# Patient Record
Sex: Male | Born: 1955 | Race: White | Hispanic: No | Marital: Married | Smoking: Former smoker
Health system: Southern US, Community
[De-identification: ages and names within clinical notes are randomized; demographics above are authoritative.]

## PROBLEM LIST (undated history)

## (undated) DIAGNOSIS — G90519 Complex regional pain syndrome I of unspecified upper limb: Secondary | ICD-10-CM

## (undated) DIAGNOSIS — G894 Chronic pain syndrome: Secondary | ICD-10-CM

## (undated) DIAGNOSIS — M797 Fibromyalgia: Secondary | ICD-10-CM

## (undated) DIAGNOSIS — J189 Pneumonia, unspecified organism: Secondary | ICD-10-CM

## (undated) DIAGNOSIS — I6529 Occlusion and stenosis of unspecified carotid artery: Secondary | ICD-10-CM

## (undated) DIAGNOSIS — F32A Depression, unspecified: Secondary | ICD-10-CM

## (undated) DIAGNOSIS — G9050 Complex regional pain syndrome I, unspecified: Secondary | ICD-10-CM

## (undated) DIAGNOSIS — G4701 Insomnia due to medical condition: Secondary | ICD-10-CM

## (undated) DIAGNOSIS — Z9289 Personal history of other medical treatment: Secondary | ICD-10-CM

## (undated) DIAGNOSIS — F329 Major depressive disorder, single episode, unspecified: Secondary | ICD-10-CM

## (undated) DIAGNOSIS — R0683 Snoring: Secondary | ICD-10-CM

## (undated) DIAGNOSIS — R112 Nausea with vomiting, unspecified: Secondary | ICD-10-CM

## (undated) DIAGNOSIS — Z9889 Other specified postprocedural states: Secondary | ICD-10-CM

## (undated) DIAGNOSIS — N059 Unspecified nephritic syndrome with unspecified morphologic changes: Secondary | ICD-10-CM

## (undated) DIAGNOSIS — K219 Gastro-esophageal reflux disease without esophagitis: Secondary | ICD-10-CM

## (undated) DIAGNOSIS — Z8669 Personal history of other diseases of the nervous system and sense organs: Secondary | ICD-10-CM

## (undated) DIAGNOSIS — J9819 Other pulmonary collapse: Secondary | ICD-10-CM

## (undated) DIAGNOSIS — G4730 Sleep apnea, unspecified: Secondary | ICD-10-CM

## (undated) DIAGNOSIS — C61 Malignant neoplasm of prostate: Secondary | ICD-10-CM

## (undated) DIAGNOSIS — G709 Myoneural disorder, unspecified: Secondary | ICD-10-CM

## (undated) DIAGNOSIS — F419 Anxiety disorder, unspecified: Secondary | ICD-10-CM

## (undated) DIAGNOSIS — G5690 Unspecified mononeuropathy of unspecified upper limb: Secondary | ICD-10-CM

## (undated) HISTORY — PX: VASCULAR SURGERY: SHX849

## (undated) HISTORY — PX: CYSTECTOMY: SUR359

## (undated) HISTORY — DX: Malignant neoplasm of prostate: C61

## (undated) HISTORY — PX: EYE SURGERY: SHX253

---

## 1985-01-14 HISTORY — PX: KNEE ARTHROSCOPY: SHX127

## 2001-01-14 HISTORY — PX: REFRACTIVE SURGERY: SHX103

## 2003-12-01 ENCOUNTER — Ambulatory Visit (HOSPITAL_COMMUNITY): Admission: RE | Admit: 2003-12-01 | Discharge: 2003-12-01 | Payer: Self-pay | Admitting: Pulmonary Disease

## 2006-01-14 DIAGNOSIS — Z8669 Personal history of other diseases of the nervous system and sense organs: Secondary | ICD-10-CM | POA: Insufficient documentation

## 2006-01-14 HISTORY — DX: Personal history of other diseases of the nervous system and sense organs: Z86.69

## 2006-03-11 ENCOUNTER — Ambulatory Visit: Payer: Self-pay | Admitting: Gastroenterology

## 2006-03-11 ENCOUNTER — Inpatient Hospital Stay (HOSPITAL_COMMUNITY): Admission: EM | Admit: 2006-03-11 | Discharge: 2006-03-12 | Payer: Self-pay | Admitting: Emergency Medicine

## 2006-03-21 ENCOUNTER — Ambulatory Visit (HOSPITAL_COMMUNITY): Admission: RE | Admit: 2006-03-21 | Discharge: 2006-03-21 | Payer: Self-pay | Admitting: Pulmonary Disease

## 2006-03-21 ENCOUNTER — Encounter (HOSPITAL_COMMUNITY): Admission: RE | Admit: 2006-03-21 | Discharge: 2006-04-20 | Payer: Self-pay | Admitting: Pulmonary Disease

## 2006-03-26 ENCOUNTER — Ambulatory Visit (HOSPITAL_COMMUNITY): Payer: Self-pay | Admitting: Pulmonary Disease

## 2006-04-02 ENCOUNTER — Ambulatory Visit: Payer: Self-pay | Admitting: Vascular Surgery

## 2006-04-08 ENCOUNTER — Ambulatory Visit (HOSPITAL_COMMUNITY): Admission: RE | Admit: 2006-04-08 | Discharge: 2006-04-08 | Payer: Self-pay | Admitting: Vascular Surgery

## 2006-04-14 ENCOUNTER — Ambulatory Visit (HOSPITAL_COMMUNITY): Admission: RE | Admit: 2006-04-14 | Discharge: 2006-04-15 | Payer: Self-pay | Admitting: Vascular Surgery

## 2006-04-24 ENCOUNTER — Encounter: Admission: RE | Admit: 2006-04-24 | Discharge: 2006-04-24 | Payer: Self-pay | Admitting: Interventional Radiology

## 2006-05-01 ENCOUNTER — Ambulatory Visit: Payer: Self-pay | Admitting: Vascular Surgery

## 2006-05-05 ENCOUNTER — Ambulatory Visit: Payer: Self-pay | Admitting: Vascular Surgery

## 2006-05-20 ENCOUNTER — Ambulatory Visit: Payer: Self-pay | Admitting: Vascular Surgery

## 2006-06-30 ENCOUNTER — Ambulatory Visit: Payer: Self-pay | Admitting: Vascular Surgery

## 2006-08-13 ENCOUNTER — Encounter: Admission: RE | Admit: 2006-08-13 | Discharge: 2006-08-13 | Payer: Self-pay | Admitting: Neurology

## 2006-12-04 ENCOUNTER — Ambulatory Visit: Payer: Self-pay | Admitting: Vascular Surgery

## 2006-12-29 ENCOUNTER — Ambulatory Visit: Payer: Self-pay | Admitting: Vascular Surgery

## 2007-01-15 HISTORY — PX: RIB RESECTION: SHX5077

## 2007-02-19 ENCOUNTER — Ambulatory Visit (HOSPITAL_COMMUNITY): Admission: RE | Admit: 2007-02-19 | Discharge: 2007-02-19 | Payer: Self-pay | Admitting: Pulmonary Disease

## 2007-05-06 ENCOUNTER — Ambulatory Visit: Payer: Self-pay | Admitting: Vascular Surgery

## 2007-05-13 ENCOUNTER — Inpatient Hospital Stay: Payer: Self-pay | Admitting: Vascular Surgery

## 2008-01-15 DIAGNOSIS — J189 Pneumonia, unspecified organism: Secondary | ICD-10-CM

## 2008-01-15 DIAGNOSIS — Z9289 Personal history of other medical treatment: Secondary | ICD-10-CM

## 2008-01-15 DIAGNOSIS — J9819 Other pulmonary collapse: Secondary | ICD-10-CM

## 2008-01-15 HISTORY — DX: Personal history of other medical treatment: Z92.89

## 2008-01-15 HISTORY — DX: Pneumonia, unspecified organism: J18.9

## 2008-01-15 HISTORY — DX: Other pulmonary collapse: J98.19

## 2008-05-25 ENCOUNTER — Ambulatory Visit: Payer: Self-pay | Admitting: Thoracic Surgery

## 2008-06-08 ENCOUNTER — Ambulatory Visit: Payer: Self-pay | Admitting: Thoracic Surgery

## 2008-06-09 ENCOUNTER — Inpatient Hospital Stay (HOSPITAL_COMMUNITY): Admission: AD | Admit: 2008-06-09 | Discharge: 2008-06-15 | Payer: Self-pay | Admitting: Thoracic Surgery

## 2008-06-09 ENCOUNTER — Ambulatory Visit: Payer: Self-pay | Admitting: Thoracic Surgery

## 2008-06-10 ENCOUNTER — Encounter: Payer: Self-pay | Admitting: Thoracic Surgery

## 2008-06-12 ENCOUNTER — Ambulatory Visit: Payer: Self-pay | Admitting: Infectious Disease

## 2008-06-14 ENCOUNTER — Encounter: Payer: Self-pay | Admitting: Infectious Disease

## 2008-06-14 ENCOUNTER — Encounter: Payer: Self-pay | Admitting: Thoracic Surgery

## 2008-06-22 ENCOUNTER — Ambulatory Visit: Payer: Self-pay | Admitting: Thoracic Surgery

## 2008-06-22 ENCOUNTER — Encounter: Admission: RE | Admit: 2008-06-22 | Discharge: 2008-06-22 | Payer: Self-pay | Admitting: Thoracic Surgery

## 2008-06-28 ENCOUNTER — Ambulatory Visit: Payer: Self-pay | Admitting: Thoracic Surgery

## 2008-06-28 ENCOUNTER — Encounter: Admission: RE | Admit: 2008-06-28 | Discharge: 2008-06-28 | Payer: Self-pay | Admitting: Thoracic Surgery

## 2008-07-06 ENCOUNTER — Ambulatory Visit: Payer: Self-pay | Admitting: Thoracic Surgery

## 2008-07-06 ENCOUNTER — Encounter: Admission: RE | Admit: 2008-07-06 | Discharge: 2008-07-06 | Payer: Self-pay | Admitting: Thoracic Surgery

## 2008-07-13 ENCOUNTER — Ambulatory Visit: Payer: Self-pay | Admitting: Thoracic Surgery

## 2008-07-20 ENCOUNTER — Encounter: Admission: RE | Admit: 2008-07-20 | Discharge: 2008-07-20 | Payer: Self-pay | Admitting: Thoracic Surgery

## 2008-07-20 ENCOUNTER — Ambulatory Visit: Payer: Self-pay | Admitting: Thoracic Surgery

## 2008-08-17 ENCOUNTER — Encounter: Admission: RE | Admit: 2008-08-17 | Discharge: 2008-08-17 | Payer: Self-pay | Admitting: Thoracic Surgery

## 2008-08-17 ENCOUNTER — Ambulatory Visit: Payer: Self-pay | Admitting: Thoracic Surgery

## 2008-08-23 ENCOUNTER — Ambulatory Visit: Payer: Self-pay | Admitting: Thoracic Surgery

## 2008-09-22 ENCOUNTER — Ambulatory Visit: Payer: Self-pay | Admitting: Vascular Surgery

## 2008-11-16 ENCOUNTER — Encounter: Admission: RE | Admit: 2008-11-16 | Discharge: 2008-11-16 | Payer: Self-pay | Admitting: Thoracic Surgery

## 2008-11-16 ENCOUNTER — Ambulatory Visit: Payer: Self-pay | Admitting: Thoracic Surgery

## 2009-02-16 ENCOUNTER — Ambulatory Visit: Payer: Self-pay | Admitting: Vascular Surgery

## 2009-02-22 ENCOUNTER — Ambulatory Visit: Payer: Self-pay | Admitting: Thoracic Surgery

## 2010-02-03 ENCOUNTER — Encounter: Payer: Self-pay | Admitting: Pulmonary Disease

## 2010-02-04 ENCOUNTER — Encounter: Payer: Self-pay | Admitting: Vascular Surgery

## 2010-04-24 LAB — BASIC METABOLIC PANEL
BUN: 8 mg/dL (ref 6–23)
Chloride: 98 mEq/L (ref 96–112)
GFR calc non Af Amer: >60 mL/min (ref >60)
Glucose, Bld: 128 mg/dL — ABNORMAL HIGH (ref 70–99)
Potassium: 4 mEq/L (ref 3.5–5.1)
Sodium: 137 mEq/L (ref 135–145)

## 2010-04-24 LAB — Q FEVER ABS IGG, IGM W/ REFLEX TITER
Q Fever Phase I: <1:16 {titer}
Q Fever Phase II: <1:16 {titer}

## 2010-04-24 LAB — CROSSMATCH: ABO/RH(D): A POS

## 2010-04-24 LAB — LACTATE DEHYDROGENASE: LDH: 114 U/L (ref 94–250)

## 2010-04-24 LAB — BLOOD GAS, ARTERIAL
Acid-Base Excess: 3.7 mmol/L — ABNORMAL HIGH (ref 0.0–2.0)
Bicarbonate: 28.1 mEq/L — ABNORMAL HIGH (ref 20.0–24.0)
O2 Saturation: 99.4 %
pCO2 arterial: 44.9 mmHg (ref 35.0–45.0)
pO2, Arterial: 111 mmHg — ABNORMAL HIGH (ref 80.0–100.0)

## 2010-04-24 LAB — DIFFERENTIAL
Basophils Absolute: 0 10*3/uL (ref 0.0–0.1)
Eosinophils Absolute: 0.3 10*3/uL (ref 0.0–0.7)
Eosinophils Relative: 6 % — ABNORMAL HIGH (ref 0–5)
Lymphocytes Relative: 27 % (ref 12–46)
Lymphs Abs: 1.3 10*3/uL (ref 0.7–4.0)
Monocytes Absolute: 0.3 10*3/uL (ref 0.1–1.0)

## 2010-04-24 LAB — AFB CULTURE WITH SMEAR (NOT AT ARMC): Acid Fast Smear: NONE SEEN

## 2010-04-24 LAB — COMPREHENSIVE METABOLIC PANEL
ALT: 29 U/L (ref 0–53)
ALT: 31 U/L (ref 0–53)
ALT: 49 U/L (ref 0–53)
AST: 19 U/L (ref 0–37)
AST: 20 U/L (ref 0–37)
AST: 35 U/L (ref 0–37)
Albumin: 1.9 g/dL — ABNORMAL LOW (ref 3.5–5.2)
Alkaline Phosphatase: 140 U/L — ABNORMAL HIGH (ref 39–117)
Alkaline Phosphatase: 153 U/L — ABNORMAL HIGH (ref 39–117)
BUN: 6 mg/dL (ref 6–23)
CO2: 31 mEq/L (ref 19–32)
Calcium: 8.3 mg/dL — ABNORMAL LOW (ref 8.4–10.5)
Creatinine, Ser: 1.08 mg/dL (ref 0.4–1.5)
GFR calc Af Amer: >60 mL/min (ref >60)
GFR calc Af Amer: >60 mL/min (ref >60)
GFR calc Af Amer: >60 mL/min (ref >60)
GFR calc non Af Amer: >60 mL/min (ref >60)
GFR calc non Af Amer: >60 mL/min (ref >60)
Glucose, Bld: 102 mg/dL — ABNORMAL HIGH (ref 70–99)
Potassium: 3.5 mEq/L (ref 3.5–5.1)
Potassium: 3.8 mEq/L (ref 3.5–5.1)
Sodium: 135 mEq/L (ref 135–145)
Sodium: 143 mEq/L (ref 135–145)
Total Bilirubin: 0.6 mg/dL (ref 0.3–1.2)
Total Protein: 6.4 g/dL (ref 6.0–8.3)
Total Protein: 7.8 g/dL (ref 6.0–8.3)

## 2010-04-24 LAB — URINALYSIS, MICROSCOPIC ONLY
Glucose, UA: NEGATIVE mg/dL
Leukocytes, UA: NEGATIVE
Protein, ur: NEGATIVE mg/dL
pH: 6 (ref 5.0–8.0)

## 2010-04-24 LAB — CBC
HCT: 20.5 % — ABNORMAL LOW (ref 39.0–52.0)
HCT: 23.1 % — ABNORMAL LOW (ref 39.0–52.0)
Hemoglobin: 7.2 g/dL — CL (ref 13.0–17.0)
Hemoglobin: 9.8 g/dL — ABNORMAL LOW (ref 13.0–17.0)
MCHC: 34.3 g/dL (ref 30.0–36.0)
MCV: 82.7 fL (ref 78.0–100.0)
Platelets: 234 10*3/uL (ref 150–400)
Platelets: 251 10*3/uL (ref 150–400)
RBC: 3.37 MIL/uL — ABNORMAL LOW (ref 4.22–5.81)
RDW: 14.5 % (ref 11.5–15.5)
RDW: 14.7 % (ref 11.5–15.5)
WBC: 4.1 10*3/uL (ref 4.0–10.5)
WBC: 4.7 10*3/uL (ref 4.0–10.5)
WBC: 8.2 10*3/uL (ref 4.0–10.5)

## 2010-04-24 LAB — FUNGUS CULTURE W SMEAR: Fungal Smear: NONE SEEN

## 2010-04-24 LAB — BODY FLUID CULTURE

## 2010-04-24 LAB — SEDIMENTATION RATE: Sed Rate: 130 mm/hr — ABNORMAL HIGH (ref 0–16)

## 2010-04-24 LAB — C-REACTIVE PROTEIN: CRP: 11.1 mg/dL — ABNORMAL HIGH (ref <0.6)

## 2010-04-24 LAB — FERRITIN: Ferritin: 358 ng/mL — ABNORMAL HIGH (ref 22–322)

## 2010-04-24 LAB — IRON AND TIBC
Saturation Ratios: 63 % — ABNORMAL HIGH (ref 20–55)
TIBC: 184 ug/dL — ABNORMAL LOW (ref 215–435)

## 2010-04-24 LAB — TISSUE CULTURE

## 2010-04-24 LAB — ANA: Anti Nuclear Antibody(ANA): NEGATIVE

## 2010-04-24 LAB — HEPATITIS PANEL, ACUTE
HCV Ab: NEGATIVE
Hep A IgM: NEGATIVE

## 2010-04-24 LAB — PROTIME-INR: Prothrombin Time: 16.2 seconds — ABNORMAL HIGH (ref 11.6–15.2)

## 2010-04-24 LAB — VANCOMYCIN, TROUGH: Vancomycin Tr: 26.9 ug/mL (ref 10.0–20.0)

## 2010-04-24 LAB — RETICULOCYTES
RBC.: 3.18 MIL/uL — ABNORMAL LOW (ref 4.22–5.81)
Retic Count, Absolute: 70 10*3/uL (ref 19.0–186.0)

## 2010-04-24 LAB — FOLATE: Folate: >20 ng/mL

## 2010-04-30 DIAGNOSIS — Z48812 Encounter for surgical aftercare following surgery on the circulatory system: Secondary | ICD-10-CM

## 2010-05-29 NOTE — Assessment & Plan Note (Signed)
OFFICE VISIT   VICENT, FEBLES  DOB:  [birth year]                                        July 06, 2008  CHART #:  [id number]   The patient came today and his chest x-ray looks good.  He has had  decreased drainage.  We removed his #36 chest tube and replaced it with  a #28 chest tube, and we will see him back in 1 week and discontinue his  chest tube at that time.  His blood pressure was 135/80, pulse 78,  respirations 18, and sats were 95%.   Ines Bloomer, M.D.  Electronically Signed   DPB/MEDQ  D:  07/06/2008  T:  07/06/2008  Job:  [id number]

## 2010-05-29 NOTE — Assessment & Plan Note (Signed)
OFFICE VISIT   FAYE, SANFILIPPO  DOB:  1955-04-20                                        June 28, 2008  CHART #:  [id number]   The patient came for followup today and his chest x-ray looks good.  His  drainage is decreasing, his inside looks great.  He has got 2 more days  of Augmentin.  His blood pressure was 140/90, pulse 100, respirations  20, and sats were 96%.  I plan to see him back again in 1 week and at  that time I will definitely advance his tube, if not, remove the tube.   Ines Bloomer, M.D.  Electronically Signed   DPB/MEDQ  D:  06/28/2008  T:  06/28/2008  Job:  [id number]

## 2010-05-29 NOTE — Assessment & Plan Note (Signed)
OFFICE VISIT   BENSON, PORCARO  DOB:  22-Nov-1955                                        November 16, 2008  CHART #:  [id number]   The patient came for final followup today of his empyema in the right  side and there is improving postoperative changes with good aeration.  He has a recent URI.  He is on antibiotics for this.  There is no  pneumonia that was seen.  His blood pressure was 144/85, pulse 90,  respirations 18, and sats were 97%.  I released him back to his medical  doctor and we will see him again if he has any future problems.   Ines Bloomer, M.D.  Electronically Signed   DPB/MEDQ  D:  11/16/2008  T:  11/17/2008  Job:  [id number]

## 2010-05-29 NOTE — H&P (Signed)
Kenneth [name], Kenneth [name]                 ACCOUNT NO.:  [id number]   MEDICAL RECORD NO.:  [id number]          PATIENT TYPE:  INP   LOCATION:                               FACILITY:  [facility]   PHYSICIAN:  Ines Bloomer, M.D. DATE OF BIRTH:  February 18, 1955   DATE OF ADMISSION:  06/09/2008  DATE OF DISCHARGE:                              HISTORY & PHYSICAL   CHIEF COMPLAINT:  Fevers.   HISTORY OF PRESENT ILLNESS:  This 55 year-old patient was admitted to  [facility] with a right pleural effusion and a right-sided  pneumonia.  Chest tube was inserted and was thought to have fluid  increased exudative characteristics and increased protein.  Started on  antibiotics.  Follow-up CT scan showed a loculated effusion.  He was  discharged on antibiotics and was feeling better.  We decided to repeat  his CT scan which was done a week later on the 20th, and it shows that  the loculated effusion actually was the same, or this may be slightly  decreased.  Within the last week he has had more fever up to 100.5 and  feeling poorly.  He has not been on antibiotics.  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  He  underwent lysis and eventually a first rib resection at [facility].  He still has significant neurological damage to the left arm and has  chronic pain and his arm is in a sling.   MEDICATIONS:  Lyrica, Metanx, Aciphex, Xanax, oxycodone.  And he also  has a word   PAST MEDICAL HISTORY:  GERD.   ALLERGIES:  No known drug allergies.   FAMILY HISTORY:  Unremarkable.   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  Quit  smoking several years.  Does not drink alcohol on a regular basis.   REVIEW OF SYSTEMS:  GENERAL:  He is 185 pounds, 6 feet 2 inches.  He had  some episodes of fevers  and chills with some problems with breathing.  CARDIAC:  No angina or atrial fibrillation.  PULMONARY:  No hemoptysis.  GI:  He has reflux.  GU:  No kidney disease, dysuria or frequent  urination.  VASCULAR:  See past medical history.  He has no TIAs, upper  extremity DVT.  NEUROLOGICAL:  He has dizziness.  No headaches or  seizures or blackouts.  MUSCULOSKELETAL:  See past medical history.  PSYCHIATRIC:  No depression, but is on chronic pain medications  including methadone.  ENT:  No changes in eyesight or hearing.  HEMATOLOGICAL:  No problems with bleeding, clotting disorders or anemia.  He is medically disabled.   PHYSICAL EXAMINATION:  GENERAL:  He is an ill-appearing Caucasian male  in no acute distress.  His blood pressure was 131/73, pulse 100,  respirations 16, sats were 98%.  HEENT:  Head is atraumatic.  Eyes: Pupils equal,  reactive to light and  accommodation.  Extraocular movements normal.  Ears: Tympanic membranes  are intact.  Nose: There is no septal deviation.  NECK:  There is a left supraclavicular incision for the first rib  resection.  His left arm is in a sling.  HEART:  Regular sinus rhythm, no murmurs.  CHEST:  Clear to auscultation and percussion except decreased breath  sounds on the right base.  ABDOMEN:  Soft.  Bowel sounds are normal.  EXTREMITIES:  Pulses 2+, decreased in grip and extension of his left  arm.  NEUROLOGICAL:  He is oriented x3.  Sensory and motor intact except his  left arm.   IMPRESSION:  1. Likely a right pleural effusion, probable empyema.  2. Neurological injury left arm.  3. Chronic pain, left arm.  4. Status post first rib resection.   PLAN:  Admit to hospital for IV antibiotics and drainage of empyema.      Ines Bloomer, M.D.  Electronically Signed     DPB/MEDQ  D:  06/08/2008  T:  06/08/2008  Job:  [id number]

## 2010-05-29 NOTE — Assessment & Plan Note (Signed)
OFFICE VISIT   [name], Kenneth L  DOB:  1955/07/31                                        July 20, 2008  CHART #:  [id number]   Blood pressure was 143/79, pulse 86, respirations 18, and sats were 97%.  His empyema site is healing well and his chest x-ray is stable.  He is  doing well overall.  We will see him back with a final chest x-ray.   Ines Bloomer, M.D.  Electronically Signed   DPB/MEDQ  D:  07/20/2008  T:  07/21/2008  Job:  [id number]

## 2010-05-29 NOTE — Letter (Signed)
May 25, 2008   Elise Benne, MD  [address]  [address]  [place], [name] [address]   Re:  Kenneth [name], NAAS                  DOB:  [birth year]   Dear Dr. Orson Aloe:   I appreciate the opportunity of seeing the patient.  This 55 year old  patient was recently admitted to [facility] with a right-sided  pneumonia and pleural effusion.  He had a chest tube inserted and the  fluid had exudative characteristics with increased protein.  He was  placed on antibiotic.  Followup CT scan showed loculated right pleural  effusion.  He has since been discharged on antibiotics and  had an  increase in his breathing and is feeling much better.  He has a  complicated history and then he was admitted to a hospital 2 years ago  with infection and apparently had infiltration in his left arm which  resulted in thoracic outlet syndrome, thrombosis of his left subclavian  vein and when he underwent lysis and eventually had a first rib  resection done in [facility], but still has significant problems  with his left arm with neurological damage to his left arm and functions  with a sling, has chronic pain.  He has also been treated for reflux.  His medications include methadone, Lyrica, Metanx, Aciphex, Xanax,  and  oxycodone.  He has no allergies.   FAMILY HISTORY:  Unremarkable.   SOCIAL HISTORY:  Has a farm, works as a Production assistant, radio.  He quit  smoking many years ago.  He does not drink alcohol on a regular basis.   REVIEW OF SYSTEMS:  He is 185 pounds.  He is 6 feet 2.  He has episodes  of fever and chills, which is causing some problems with breathing and  he has had some recent loss of appetite.  He gets shortness of breath  with exertion.  Pulmonary:  No hemoptysis.  GI:  Reflux.  GU:  No kidney  disease, dysuria, or frequent urination.  Vascular:  See the past  medical history.  He has had upper extremity DVT.  No TIAs.  Neurological:  He has had dizziness.  No headaches or  seizures.  Musculoskeletal:  See past medical history.  Psychiatric:  No depression  or nervousness.  Eyes/ENT:  No changes in eyesight or hearing.  Hematological:  He has problems with bleeding, clotting disorders, or  anemia.  He is medically disabled.   PHYSICAL EXAMINATION:  GENERAL:  He is an ill-appearing Caucasian male  in no acute distress.  VITAL SIGNS:  Blood pressure is 143/83, pulse 100, respirations 18, and  saturations were 96%.  HEAD, EYES, EARS, NOSE, AND THROAT:  Unremarkable.  NECK:  There is a left supraclavicular incision for his supraclavicular  approach to his first rib.  CHEST:  Clear to auscultation and percussion, may be some decreased  breath sounds in the right base.  HEART:  Regular sinus rhythm.  No murmur.  ABDOMEN:  Soft.  EXTREMITIES:  Pulses are 2+.  He has a marked decreased grip and  extension of his left arm.  NEUROLOGICAL:  He is oriented x3.  Sensory and motor intact except for  the left arm.   I feel that he does have loculated effusions which would go along with a  loculated empyema; however, he is asymptomatic.  This last CT scan was 2  weeks ago and  symptomatically he is improved.  I think the best thing to  do is to take conservative approach given his medical history and I will  plan to see him back again in 1 week after he gets his next CT scan.   Kenneth [name], M.D.  Electronically Signed   DPB/MEDQ  D:  05/25/2008  T:  05/26/2008  Job:  [id number]   cc:   Ramon Dredge L. Juanetta Gosling, M.D.

## 2010-05-29 NOTE — Assessment & Plan Note (Signed)
OFFICE VISIT   Kenneth [name], Kenneth [name]  DOB:  [birth year]                                        August 17, 2008  CHART #:  [id number]   The patient came today.  He feels well.  His chest x-ray showed normal  postoperative changes.  His blood pressure is 148/89, pulse 96,  respirations 18, sats were 94%.  I will plan to see him back again in 3  months with a chest x-ray for a final check.   Ines Bloomer, M.D.  Electronically Signed   DPB/MEDQ  D:  08/17/2008  T:  08/18/2008  Job:  [id number]

## 2010-05-29 NOTE — Assessment & Plan Note (Signed)
OFFICE VISIT   Kenneth [name], Kenneth [name]  DOB:  1955/07/22                                       06/30/2006  ZOXWR#:[id number]   The patient is in today for continued followup of his left arm symptoms.  He was seen by a hand surgeon in [place] after my last visit with him, and  was started on physical therapy.  The patient reports that this made his  pain worse and also caused renewed swelling in his left arm, and he has  discontinued the physical therapy.  He is still quite miserable.  He  continues to have pain that is progressive throughout the day, extending  up into his shoulder, lateral left neck, and down throughout his arm.  He is not relieved with positioning, with heat or cold, and is taking  Vicodin for relief, but does have progressive, severe pain throughout  the day.  He is severely debilitated from this.  Again, discussed my  concern regarding the patient.  I explained that I am unclear that  thoracic outlet decompression will give him any relief.  I explained  that I did not feel that it will make matters worse, but there is a  potential for that as well.  He is frustrated at lack of ability to  treat this, and I explained that frustration alone is no indication for  surgery.  He has not seen anyone for neurologic evaluation.  Since this  is, essentially, all neurologic component, I have recommended that we  have him see the neurologist in town to determine if there are other  thoughts or etiology from this.  I will schedule this for the patient  and will see him back following their evaluation.   Larina Earthly, M.D.  Electronically Signed   TFE/MEDQ  D:  06/30/2006  T:  07/01/2006  Job:  [id number]

## 2010-05-29 NOTE — Op Note (Signed)
[name], Kenneth [name]                  ACCOUNT NO.:  [id number]   MEDICAL RECORD NO.:  [id number]          PATIENT TYPE:  INP   LOCATION:  3312                         FACILITY:  [facility]   PHYSICIAN:  Ines Bloomer, M.D. DATE OF BIRTH:  June 29, 1955   DATE OF PROCEDURE:  DATE OF DISCHARGE:                               OPERATIVE REPORT   PREOPERATIVE DIAGNOSIS:  Empyema, right chest.   POSTOPERATIVE DIAGNOSIS:  Empyema, right chest.   OPERATION PERFORMED:  1. Right VATS.  2. Resection of seventh rib.  3. Insertion of drainage of empyema.  4. Insertion of empyema tube.   SURGEON:  Ines Bloomer, MD   FIRST ASSISTANT:  Rowe Clack, [facility]   ANESTHESIA:  General anesthesia.   After general anesthesia, the patient was turned to the right lateral  thoracotomy position.  He was prepped and draped in usual sterile  manner.  A 4-cm incision was made over the seventh rib and carried down  to the seventh rib at posterior axillary line and approximately 2-3 cm  of the seventh rib was resected.  This allowed [place] to enter the pleural  cavity and at that time, we drained approximately 200 mL of pus.  It was  sent for culture and gram stain, both the fluid as well as the exudate.  The area was irrigated copiously and using the curette, the forceps were  removed.  We also then inserted a 0-degree scope and took pictures  superiorly and inferiorly of the empyema, helped to the scope to make  sure we cleared out all the exudate.  After this had been done, we then  placed 2 chest tubes, 36 and 28 chest tube laterally, 36 straight and  right-angle chest tube inferiorly and sutured these into place with 0  Vicryl and #1 Vicryl.  Dry sterile dressing was applied.  The patient  returned to the recovery room in stable condition.      Ines Bloomer, M.D.  Electronically Signed     DPB/MEDQ  D:  06/10/2008  T:  06/11/2008  Job:  [id number]

## 2010-05-29 NOTE — Letter (Signed)
June 22, 2008   Elise Benne, MD  [address]  [address]  [place], [name] [address]   Re:  GRIFFYN, KUCINSKI                  DOB:  12-13-1955   Dear Dr. Orson Aloe:   I saw the patient in the office today for followup of his right chest  empyema and his empyema tubes were draining well.  We removed one  empyema tube to convert him to single one and plan to continue to watch  him closely on a weekly basis, but overall he is doing much better.  He  has gained weight, feeling better since drainage of his empyema.  I  appreciate the opportunity of seeing the patient.  His blood pressure  was 119/80, pulse 98, respirations 20, saturations were 95%, and  temperature was 98.6.   Ines Bloomer, M.D.  Electronically Signed   DPB/MEDQ  D:  06/22/2008  T:  06/23/2008  Job:  [id number]   cc:   Ramon Dredge L. Juanetta Gosling, M.D.

## 2010-05-29 NOTE — Assessment & Plan Note (Signed)
OFFICE VISIT   VALOR, QUAINTANCE  DOB:  January 16, 1955                                        July 13, 2008  CHART #:  [id number]   He comes back today.  He has had minimal drainage from his chest tube.  We removed the chest tube without difficulty and we will see him back  again in 1 week with a chest x-ray.  His blood pressure was 167/93,  pulse 77, respirations 18, and sats were 98%.   Ines Bloomer, M.D.  Electronically Signed   DPB/MEDQ  D:  07/13/2008  T:  07/13/2008  Job:  [id number]

## 2010-05-29 NOTE — Letter (Signed)
Jun 08, 2008   Cherie Ouch, MD  [address] [place], [place] [address]   Re:  Kenneth [name], NAVARRETE                  DOB:  08-28-1955   Dear Dr. Orson Aloe:   I saw the patient back in today and a repeated CT scan unfortunately  showed that the empyema was still there.  He has run a low-grade  temperature this past week, 99-100.5, and feeling poorly.  His blood  pressure today was 131/73, pulse 100, respirations 16, sats were 98%,  temperature was 97.5.  Since his loculated empyema is not changed, I  think the best thing to do is do a rib resection and drainage of his  empyema.  I will admit him to the hospital tomorrow on Thursday, May 27,  for surgery on May 28.   I appreciate the opportunity of seeing the patient.   Sincerely,   Ines Bloomer, M.D.  Electronically Signed   DPB/MEDQ  D:  06/08/2008  T:  06/09/2008  Job:  [id number]

## 2010-05-29 NOTE — Consult Note (Signed)
NAMEIDRISSA, BEVILLE NO.:  [id number]   MEDICAL RECORD NO.:  [id number]          PATIENT TYPE:  INP   LOCATION:  3312                         FACILITY:  [facility]   PHYSICIAN:  Acey Lav, MD  DATE OF BIRTH:  Jun 07, 1955   DATE OF CONSULTATION:  DATE OF DISCHARGE:                                 CONSULTATION   REASON FOR INFECTIOUS DISEASE CONSULTATION:  Antibiotic management for  empyema.   HISTORY OF PRESENT ILLNESS:  Mr. 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  These began in the fall of 2008.  Symptoms largely would consist of fevers at 99 and up to 101 at times  range.  He would have chills and have body aches.  They were often times  brought about by exertion and would then disappear for a few days and re-  emerge.  He was being evaluated by his primary care physician and  infection disease consultation had been retained.  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  He required at least two  embolectomies and eventually resection of a rib.  His fevers, however,  continued to persist for more than a year.  This spring he also  developed pain on the right side of his chest.  He was evaluated at  [facility] where he was found to have a right-sided pneumonia and  pleural effusion.  He was treated with IV antibiotics and had a chest  tube placed.  Cultures were obtained but apparently no organisms were  isolated.  He was sent home on amoxicillin.  He had persistence of his  chronic pleural effusion and was followed by Dr. Edwyna Shell in clinic who  saw him for the first  time on May 12.  The patient in the interim had a  repeat CT scan done on May 26 which had shown recurrence of a loculated  effusion.  The patient also began to have high fevers and was admitted  to Dr. Scheryl Darter service on May 26.  He was started on vancomycin and  Zosyn.  He was taken to the operating room on May 28 and underwent a  right video assisted thoracoscopic surgery with resection of his seventh  rib and drainage of an empyema with placement of chest tube.  Cultures  are cooking and initial cultures from body fluid is showing abundant  streptococcal species.  We were consulted to assist in the workup and  management of the patient's empyema as well as his chronic fevers which  previously were without any identified source.   The patient's social history is significant for the fact that he [place]  [place].  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  The cows are raised for beef production.  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  He hunts for hobby but  has not been hunting for some time.  Previously would hunt deer and  would at times skin them.  Would not hunt rabbits or other animals.  His  travel history is pertinent for travel to [place] in the 1980s, to the  [place] as well as to [place].  Otherwise, he has only traveled along the  [address].  Other than the cattle he has a dog at home but no  birds or cats.  He has no history of recreational drug use and is  married.   PAST MEDICAL HISTORY:  1. 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  2. Left upper extremity extensive clot status post embolectomy x2 and      resection of rib in the upper chest.  3. Fever of unknown origin since 2008.  4. Empyema and pneumonia as described above.    PAST SURGICAL HISTORY:  Described above.   REVIEW OF SYSTEMS:  As described above, otherwise 10 point review of  systems is negative.  He has not experienced weight loss other than 12  pounds he lost after being admitted to [facility].   FAMILY HISTORY:  Noncontributory.   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  He manages these animals with his wife and son-in-law.  They do not employ any other people to help manage the farm.  He does  own a dog.  He used to hunt deer but has not done this for some time and  has not hunted other animals.  Again, as mentioned, he has traveled to  [place] in the 1980s, the [place] and also to [place], otherwise only  along the [employer].  He has had no ill contacts, never been  specifically exposed to someone with tuberculosis.   PHYSICAL EXAMINATION:  VITAL SIGNS:  Temperature maximum 100.7, current  blood pressure 135/67, pulse 83, breathing 18 times a minute, pulse ox  98% on 2 liters via nasal cannula.  GENERAL:  Quite pleasant gentleman in no acute distress.  HEENT:  Normocephalic, atraumatic.  Pupils equal, round and reactive to  light.  Sclerae anicteric.  Oropharynx clear.  CARDIOVASCULAR:  Regular rate and rhythm with no murmurs, gallops or  rubs.  LUNGS:  Diminished breath sounds at right base with some egophony.  There is some tenderness around the chest tube insertion site with  serosanguineous fluid draining into the chest tube drainage box.  ABDOMEN:  Soft, nondistended, nontender.  No evidence of organomegaly.  EXTREMITIES:  Lower extremities without edema.  SKIN:  Examination of the skin at upper extremities reveal hyperesthesia  on his chest wall on the left side as well as his left arm.  He has some  prominence of the veins on this side as well.   LABORATORY DATA:  Chest x-ray on May 30 shows presence  of chest tube in  place, improvement of right-sided pleural effusion after placement on  May 27.  Comprehensive  metabolic panel on May 30, sodium 142, potassium  3.8, chloride 105, bicarb 31, BUN and creatinine 6 and 1.08, glucose  110, bilirubin 0.6, alkaline phosphatase 153, AST and ALT 20 and 31.  Total protein 6.4, albumin 1.9.  CBC differential white blood cell count  4.1, hemoglobin 8, hematocrit 23.1, platelets 251.   LABORATORY DATA:  Cultures from pleural fluid obtained on May 28 showing  Gram stain with moderate gram positive cocci in pairs, chains and  clusters with abundant streptococcal species growing.  Fungal smear - no  fungal organisms seen, culture in process on May 28 from pleural fluid,  another fungal culture from tissue, no yeast or fungal elements seen,  culture pending.  Culture tissue right May 28 no white blood cells seen.  No organisms isolated.  AFB smear from May 28 no AFB organisms seen,  culture pending.  AFB smear obtained May 28 no AFB seen, culture  pending.   IMPRESSION AND RECOMMENDATIONS:  This is a 55 year old Caucasian  gentleman with a history of reflex sympathetic dystrophy syndrome  secondary to injuries associated with IV infiltrate, also with deep  venous thromboses in the left upper extremity which have been extensive,  fever unknown origin since 2008 without clear cause identified who now  has in the last month developed an empyema that has persisted despite  placement of chest tube and antibiotics and treatment at [facility] now  with recurrence of his empyema with streptococcal species growing on  pleural fluid culture.  1. Right-sided empyema:  The patient currently is broadly covered with      vancomycin and Zosyn.  He is growing streptococcal species from      culture.  I am going to await for the final identification of this      species (there has been some haphazard initial reporting from      microbiology recently so I am going to  wait for final culture).  I      anticipate narrowing him to antibiotics to cover for streptococcal      species as well as anaerobes and Zosyn would be a reasonable choice      while he is in the hospital versus Unasyn, another reasonable      antibiotic choice.  We will see how he does on parenteral therapy      and then see if he can transition to oral therapy.  2. Fever of unknown origin.  It is difficult to imagine that he could      have had fevers since the fall of 2008 due to a chronic empyema.      Certainly a bacterial process with typical bacterial pathogen would      be unusual to have lasted this long without the patient's dying      from the infection.  TB (tuberculosis) seems fairly unlikely as      well.  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  He does not birth      the animals himself but certainly Coxiella burnetii can become      aerosolized and Q fever certainly remains a differential for      possibility of chronic fever of unknown origin.  Certainly other      entities such as rheumatological diseases could be possible      although he has no other stigmata of rheumatic disease.  He  has had      apparently a chronically elevated sedimentation rate.  He has no      risk factors for HIV or viral hepatitis although I think these      should be checked for thoroughness sake.  He may need a more      extensive workup for fever of unknown origin once he has finished      his treatment for acute empyema.  In the interim I am going to send      off Q fever serologies via immunofluorescence antibody testing as      well as via agglutination testing.  I will check also certainly for      HIV, hepatitis serologies, cryoglobulins.  I will check a sed rate,      C-reactive protein.  I will check an ANA and a rheumatoid factor.      I will check a 2-D echocardiogram and check Dopplers of his upper       extremity to make sure he has not had recurrence of his DVT.   I will be happy to follow this gentleman at my infectious disease clinic  and certainly will follow him closely while he is in the hospital here  at [facility].  Thank you for this fascinating infectious disease  consultation.      Acey Lav, MD  Electronically Signed     CV/MEDQ  D:  06/12/2008  T:  06/12/2008  Job:  [id number]   cc:   Ines Bloomer, M.D.  Edward L. Juanetta Gosling, M.D.

## 2010-05-29 NOTE — Discharge Summary (Signed)
Kenneth [name], Kenneth [name]                  ACCOUNT NO.:  [id number]   MEDICAL RECORD NO.:  [id number]          PATIENT TYPE:  INP   LOCATION:  2025                         FACILITY:  [facility]   PHYSICIAN:  Ines Bloomer, M.D. DATE OF BIRTH:  Jul 16, 1955   DATE OF ADMISSION:  06/09/2008  DATE OF DISCHARGE:  06/15/2008                               DISCHARGE SUMMARY   HISTORY:  The patient is a 55 year old white male with a past medical  history significant for chronic smoking with bronchitis have been quit  several years ago.  Additionally, he has a history of an infiltration of  an IV site resulting in surgeries and subsequent deep venous thrombosis  and also reflex sympathetic dystrophy syndrome.  He has complained of  fevers with chills and rigors for more than 1 year.  These began in the  fall of 2008.  Symptoms largely could consist fevers of 99 and up to 101  at times range.  He will have associated chills and body aches.  They  were often times brought on by exertion, and were then disappear for a  few days and reemerge.  He has been evaluated by primary care and  infectious disease consultation has been retained.  In the interim, he  has had trouble with deep venous thrombosis associated with infiltration  of an intravenous line in his left arm.  This required 2 embolectomies  and eventually resection of a rib.  His fevers, however, continue to  persist for more than a year.  In the spring, he developed right-sided  chest pain.  He was evaluated at [facility] where he was found to  have a right-sided pneumonia and pleural effusion.  He was treated with  IV antibiotics and had a chest tube placed.  Cultures were obtained and  no organisms were isolated.  He was discharged on amoxicillin.  He was  referred to Dr. Edwyna Shell in thoracic surgical consultation.  A CT scan  done on May 26, showed recurrence of loculated effusion.  The patient  also began to have high fevers and this  prompted admission.  Plans were  to start him on vancomycin and Zosyn.  He will have operative  intervention.   PAST MEDICAL HISTORY:  1. Reflex sympathetic dystrophy syndrome, secondary to infiltration of      an IV.  2. Left upper arm extensive clot, status post embolectomy x2 and      resection of rib in the upper chest.  3. History of fever of unknown origins since 2008.   PAST SURGICAL HISTORY:  Described above.   FAMILY HISTORY, SOCIAL HISTORY, REVIEW OF SYSTEMS, AND PHYSICAL  EXAMINATION:  Please see the history and physical done at the time of  admission.   MEDICATIONS PRIOR TO ADMISSION:  Lyrica, Metanx, Aciphex, Xanax, and  oxycodone per Dr. Scheryl Darter history and physical.   According the medication reconciliation included:  1. Aciphex EC 20 mg daily.  2. Lyrica 150 mg b.i.d.  3. Methadone 10 mg one-half tablet in the a.m., one-half in the p.m.  4. Metanx 1 pill  b.i.d.  5. Xanax 0.5 mg p.r.n. t.i.d.  6. Lunesta 3 mg nightly.  7. Aspirin 81 mg daily.  8. Mirtazapine 30 mg 1 tablet at bedtime.   HOSPITAL COURSE:  The patient was taken to the operating room on Jun 10, 2008 and underwent the following procedure right video-assisted  thoracoscopy resection of the seventh rib, insertion of empyema tube,  and drainage of empyema.  The patient tolerated the procedure well and  was taken to the postanesthesia care unit stable condition.   POSTOPERATIVE HOSPITAL COURSE:  The patient has progressed nicely.  He  continues to have his chest tube.  These will be continued at home for  ongoing drainage.  He has been seen in infectious disease consultation  by Dr. Daiva Eves.  Initially, he was recovered with vancomycin and Zosyn.  The  organisms grown in culture isolated thus far have only been  microaerophilic strep.  They are felt to be very common cause and part  of the flora of empyemas.  It is Dr. Zenaida Niece Dam's opinion in association  with Dr. Maurice March that he should continue at home  on 2 more weeks of oral  therapies specifically Augmentin 875 mg b.i.d.  His chest x-ray reveals  to be very stable appearance.  His oxygenation is adequate on room air  with saturations of 97%.  His pain is under control on an oral regimen.  His activity was increased using standard protocols and is acceptable  for discharge.  Home dressing arrangements have been made to assist him  with care and management of the chest tube.  Although, all his status is  felt to be stable for discharge on today's date June 15, 2008.   MEDICATIONS ON DISCHARGE:  1. Aciphex 20 mg 1 daily.  2. Lyrica 150 mg b.i.d.  3. Methadone 5 mg b.i.d.  4. Metanx 1 b.i.d.  5. Xanax 0.5 p.r.n. t.i.d.  6. Lunesta 3 mg at bedtime.  7. Aspirin 81 mg daily.  8. Mirtazapine 30 mg at bedtime.  9. Oxycodone 5 mg 1-2 every 4-6 hours p.r.n. for pain.  10.Augmentin 876 mg twice daily for 14 days.   FINAL DIAGNOSIS:  Empyema status post right chest tube placement with  drainage and resection of the seventh rib.  For full details of the  procedure, please see the dictated operative note.  Cultures are  positive for microaerophilic streptococci.   OTHER DIAGNOSES:  1. Gastroesophageal reflux.  2. Reflex sympathetic dystrophy.  3. Left upper arm extensive clots/deep venous thrombosis, status post      embolectomy x2 and resection of the first rib.  4. Fever of unknown origins, since 2008.   INSTRUCTIONS:  The patient will receive written instructions in regard  to medications, activity, diet, wound care, and followup.   FOLLOWUP:  Dr. Edwyna Shell in 1 week with the chest x-ray.  He will also see  Dr. Daiva Eves in 1-2 months.   CONDITION ON DISCHARGE:  Stable and improving.      Rowe Clack, P.A.-C.      Ines Bloomer, M.D.  Electronically Signed    WEG/MEDQ  D:  06/15/2008  T:  06/16/2008  Job:  [id number]   cc:   Ines Bloomer, M.D.  Acey Lav, MD

## 2010-06-01 NOTE — Consult Note (Signed)
NAMENELTON, AMSDEN                  ACCOUNT NO.:  [id number]   MEDICAL RECORD NO.:  [id number]          PATIENT TYPE:  OIB   LOCATION:  5703                         FACILITY:  [facility]   PHYSICIAN:  Kenneth [name], Kenneth GravesDATE OF BIRTH:  1955-02-01   DATE OF CONSULTATION:  04/15/2006  DATE OF DISCHARGE:  04/15/2006                                 CONSULTATION   CHIEF COMPLAINT:  Acute renal failure/hematuria.   HISTORY OF PRESENT ILLNESS:  Kenneth [name] is a 55 year old male who is  status post angioplasty and venography on April 14, 2006 for left upper  extremity DVT that was initially diagnosed on March 11, 2006.  The  patient states that he developed a DVT secondary to an infiltrated IV  after a hospitalization in February 2008 for a viral illness.  The  patient had been started outpatient on Lovenox and bridged with  Coumadin. Upon admission his INR was 1. He was admitted on April 14, 2006 for thrombectomy and angioplasty and then developed gross hematuria  2-3 hours after this procedure.  The patient also developed acute non-  oliguric renal failure with a baseline creatinine of 1 on March 25,  which has now increased to 1.8.   PAST MEDICAL HISTORY:  Gastroesophageal reflux disease.   MEDICATIONS:  At time of consultation.  1. Prilosec 40 mg daily.  2. Ambien 10 mg q.h.s. p.r.n. insomnia.  3. Vicodin 1 mg 1 tablet p.o. q. four p.r.n.  4. Heparin 1000 units per hour.  5. D5 half normal saline with 20 of K at 175.   ALLERGIES:  The patient has no known drug allergies.   SOCIAL HISTORY:  Significant for remote tobacco abuse.  The patient quit  30 years ago.  Denies any alcohol use and is married.   REVIEW OF SYSTEMS:  Negative except for history of present illness.   PHYSICAL EXAMINATION:  VITAL SIGNS:  Temperature 97.9, blood pressure  114-149/74-89, heart rate 62, respirations 18, 96% on room air.  The  patient has had approximately 300 urine output over the last 24  hours.  GENERAL:  In general the patient is alert, awake and oriented x4.  He  has moist mucous membranes.  CARDIAC:  Regular rate and rhythm with no  murmurs.  PULMONARY:  Clear to auscultation bilaterally.  ABDOMEN:  Soft, nontender with some bruising in the right lower quadrant  at injection site of Lovenox.  There is no guarding or CVA tenderness.  EXTREMITIES:  Reveal no evidence of edema.  The patient does have  excessive venous markings around his left subclavian, but no other  swelling noted.   White blood cell count 8.7, hemoglobin 13, platelets 131.  Sodium 141,  potassium 4.1, chloride 111, bicarb 21, BUN 16, creatinine 1.7, glucose  of 152, PTT of 27 heparin level less than 10.  Urine dipstick shows  specific gravity of 1.007 with large blood, negative protein, negative  nitrite and leukocyte esterase, no microscopic was obtained.   ASSESSMENT/PLAN:  1. This is 55 year old with acute renal failure.  This is likely  prerenal versus contrast induced secondary to his venogram      yesterday.  We will encourage p.o. hydration as well as continue      the patient's IV fluid at 175 an hour.  We recommend that he obtain      a urinalysis to look for an red blood cells on microscopic there is      no history suggestive of rhabdo or any signs or symptoms suggestive      of urinary tract infection.  The patient should have a repeat BMP      in follow up with his primary care physician on April 16, 2006. This      plan was discussed his primary care physician, Dr. Juanetta Gosling.  2. Hematuria.  The patient states this has now resolved.  This is      likely secondary to his angioplasty/thrombectomy and the patient      has also been on heparin. His levels are therapeutic at this time      and his hemoglobin is also stable. Since this has resolved, this      can be followed up on an outpatient basis.  3. Disposition.  The patient is requesting to be discharged from the      hospital  today. Has adequate follow-up with his primary care      physician. Discharge plans were discussed with his primary care      physician as well as the primary team.  The patient is to have a      repeat BMP drawn with his lab through his primary care physician on      April 16, 2006.      Kenneth [name], M.D.    ______________________________  Kenneth [name], M.D.    MR/MEDQ  D:  04/15/2006  T:  04/16/2006  Job:  [id number]

## 2010-06-01 NOTE — H&P (Signed)
NAMEPRITESH, Kenneth [name]                  ACCOUNT NO.:  [id number]   MEDICAL RECORD NO.:  [id number]          PATIENT TYPE:  INP   LOCATION:  A318                          FACILITY:  [facility]   PHYSICIAN:  Catalina Pizza, M.D.        DATE OF BIRTH:  14-Mar-1955   DATE OF ADMISSION:  03/11/2006  DATE OF DISCHARGE:  LH                              HISTORY & PHYSICAL   PRIMARY CARE PHYSICIAN:  Edward L. Juanetta Gosling, M.D.   HISTORY OF PRESENT ILLNESS:  Mr. Arnott is a of 55 year old white male  with minimal past medical history other than a gastroesophageal reflux  disease on minimal medicines.  He has not seen Dr. Juanetta Gosling in  approximately 2 years.  Does mention some history of high triglycerides  before and question of dyslipidemia.  Was in his usual state of health  up until this morning at approximately 9:00 a.m. and had episode of  lower abdominal pain and diarrhea.  The abdominal pain was constant and  pretty significant range of approximately 6/10 but continued to hurt,  and the patient came into the emergency room at approximately 2:00 p.m.  At that time, he did have two episodes of vomiting, and the patient  states that he did feel a little bit better although the pain was still  there.  He did not notice any blood in his vomitus and denied any  problems with urination.  Denies any chest pain or shortness of breath.  He was seen and evaluated in the emergency department.  Given his  leukocytosis of 11,000 and his degree of pain and the fact that he  usually avoids going to the physician, ER physician felt it was  warranted for admission.   MEDICATIONS:  He is only on Prilosec OTC, occasional Tylenol sinus and  also taking Naproxen approximately 500 mg in the morning and  approximately 250 mg in the evening, and this has been pretty  significant over the last several months due to knee and joint pain.   PAST MEDICAL HISTORY:  As mentioned above, gastroesophageal reflux  disease, history of  arthroscopic knee surgery approximately 20 years  ago, question dyslipidemia with hypertriglyceridemia.   REVIEW OF SYSTEMS:  The patient denies any fever.  No significant weight  loss or weakness.  No dizziness.  No numbness, tingling in hands or  feet.  No headaches.  No shortness of breath.  No chest pain.  Abdominal  pain is lower abdomen, greatest in the right lower quadrant.  No lower  extremity edema.  Does have chronic joint pain but no specific swelling.  No other rashes or neurologic symptoms.   FAMILY HISTORY:  Significant for coronary artery disease and  hypertension.   SOCIAL HISTORY:  The patient is a nondrinker.  Denies any drug abuse,  previous history of smoking, quit approximately 3 years ago.  Does  continue to dip tobacco though.  Smoked for approximately 30 years.   ALLERGIES:  NO KNOWN DRUG ALLERGIES.   PHYSICAL EXAMINATION:  VITAL SIGNS: Temperature is 97.6, blood pressure  129/82, pulse 88,  respirations 20, weight is 189.8  GENERAL:  This is a white male lying in bed in no acute distress.  HEENT is unremarkable.  Pupils equal and react to light accommodation.  Extraocular movements intact.  Oropharynx is clear.  NECK:  Supple.  No thyromegaly.  No carotid bruits.  No lymphadenopathy.  HEART:  Regular rate and rhythm.  No murmurs, gallops or rubs.  LUNGS:  Clear to auscultation bilaterally.  ABDOMEN:  Mildly diffusely tender, mostly in the lower quadrant.  No  epigastric tenderness.  Pain is mostly located on the right side with no  significant rebound tenderness but some guarding.  Positive bowel sounds  heard throughout.  Did not appreciate any masses or a pattern  splenomegaly.  No bruits appreciated.  EXTREMITIES:  No lower extremity edema.  NEUROLOGICAL: Cranial nerves II-XII intact.  Alert and oriented x3.  SKIN:  No signs of rash.   LABORATORY FINDINGS:  CBC shows white count 11.7, hemoglobin of 16.1,  platelet count 167, neutrophils of 93 with  absolute neutrophil count at  10.9, significant left shift.  C-Met shows sodium 138, potassium 3.6,  chloride 105, CO2 24, glucose 140, BUN 24, creatinine 1.27, total bili  mildly elevated 1.4, alk phos 48, SGOT 22, SGPT 25, total protein 6.9,  albumin 4.3, calcium 8.9.  UA was negative but specific gravity greater  than 1.030.  Lipase was 54.  CT scan final results not available on the  computer at this time, but ER physician stated there were no signs of  diverticulitis, no appendicitis.  No other abnormal findings on CT scan  per report from ER physician.   IMPRESSION:  This is a 55 year old white male who presented with  abdominal pain of unknown etiology, also has mild leukocytosis with  episode of diarrhea and episodes of nausea and vomiting.   ASSESSMENT/PLAN:  1. Abdominal pain.  The patient has not had any fever, very mild      leukocytosis, and differential does include appendicitis which is      not seen on CT scan for possible early diverticulitis both of which      are not seen on that CT scan.  Although does not have any      significant epigastric type tenderness, it is more right lower      quadrant question of referred pain from gallbladder or      pancreatitis, both possible, although neither showed up on CT as      well.  May be run mill gastroenteritis with an episode of diarrhea      and nausea and vomiting.  Final CT report will need to be reviewed      by Dr. Juanetta Gosling.  Also, given his significant use of naproxen, the      question of colitis, although not apparent on CT scan, question      whether colonoscopy would be warranted, although the patient does      not have any blood in his stools and no significant signs at this      time.  2. Leukocytosis.  This may be secondary to the nausea and vomiting,      but given his symptoms of possible gastroenteritis and possible      colitis, did go ahead and start on IV metronidazole and     ciprofloxacin.  Does have  significant left shift on that.  3. Question hypercholesterolemia/hypertriglyceridemia.  Question      whether significantly high triglycerides which the  patient's wife      stated was possible before.  Question whether this could be causing      some issues related to pancreatitis(?).   DISPOSITION:  The patient will be seen and assessed by Dr. Juanetta Gosling in  the morning.  If symptoms improve, then it may have been just a run of  the mill gastroenteritis with some signs of colitis.  Although with  recheck of this, may have been caught so early that signs of any further  appendicitis or diverticulitis were not apparent on CT scan, and if  continues, may warrant getting a repeat of that.      Catalina Pizza, M.D.  Electronically Signed     ZH/MEDQ  D:  03/11/2006  T:  03/12/2006  Job:  [id number]

## 2010-06-01 NOTE — Consult Note (Signed)
NAMEBRYCEN, BEAN                  ACCOUNT NO.:  [id number]   MEDICAL RECORD NO.:  [id number]          PATIENT TYPE:  INP   LOCATION:  A318                          FACILITY:  [facility]   PHYSICIAN:  Kassie Mends, M.D.      DATE OF BIRTH:  04-30-1955   DATE OF CONSULTATION:  03/12/2006  DATE OF DISCHARGE:  03/12/2006                                 CONSULTATION   REASON FOR CONSULTATION:  1. Nausea.  2. Vomiting.  3. Diarrhea.  4. Abdominal pain.   REFERRING PHYSICIAN:  Kari Baars, M.D.   HPI:  [name] is a 55 year old male who had a sudden onset of diarrhea  and abdominal pain on Tuesday.  He vomited a total 3-5 times.  He did  not see any blood.  He had 5 watery stools on Wednesday.  He denies any  nausea and vomiting today.  He states his abdominal pain began on  Tuesday as well.  It was all anterior abdominal pain.  His abdominal  pain is pretty much gone.  He denies any blood in his stool.  He has no  history of diverticulosis.  He has had periodic episodes of vomiting in  the middle of the night which he has attributed to his reflux disease.  He takes Prilosec daily without fail.  He has never seen a  gastroenterologist.  He denies any problems swallowing.  He denies any  fever.  He was sweaty and clammy on Tuesday night.  He was sweaty and  clammy as well when he came into the emergency department.  He was  wondering if he could go home.   PAST MEDICAL HISTORY:  1. Gastroesophageal reflux disease.  2. Hyperlipidemia.   PAST SURGICAL HISTORY:  Arthroscopic knee surgery.   ALLERGIES:  NO KNOWN DRUG ALLERGIES.   HOME MEDICATIONS:  Prilosec over-the-counter.   MEDICATIONS WHILE IN THE HOSPITAL:  1. Cipro.  2. Flagyl.  3. Protonix.   FAMILY HISTORY:  He has no family history of colon cancer or colon  polyps.   SOCIAL HISTORY:  He is married and his wife and daughter are with him  today.  He does not drink any alcohol.  He quit smoking 3 years ago.   REVIEW OF  SYSTEMS:  Per HPI, otherwise, all systems negative.   PHYSICAL EXAM:  Temperature 98.6, blood pressure 139/79, pulse 81,  respiratory rate 20.  GENERAL:  He is in no apparent distress, alert and oriented x4.HEENT:  Atraumatic, normocephalic.  Pupils equal and react to light.MOUTH:  No  oral lesions.POSTERIOR PHARYNX:  Without erythema or exudate.NECK:  Full  range of motion, no lymphadenopathy.LUNGS:  Clear to auscultation  bilaterally.CARDIOVASCULAR:  Regular rhythm, no murmur, normal S1 and  S2.ABDOMEN:  Bowel sounds present, soft, nondistended, nontender, no  abdominal bruits, no pulsatile masses.EXTREMITIES:  Without cyanosis,  clubbing or edema.NEURO:  He has no focal neurologic deficits.   LABS:  His white count on admission was 11.7 with 93% segs and today it  is 7.1, hemoglobin 15.1, platelets 166,000, segs 87%, potassium 4.1, BUN  22, creatinine  1.15, alkaline phosphatase 47, AST 18, ALT 21, albumin  3.7, amylase 83, lipase 19, UA negative.   CT scan on February 26, of the abdomen and pelvis with IV contrast,  showed gastroesophageal reflux disease and no acute intraabdominal  process.   ASSESSMENT:  Mr. Muff is a 55 year old male with acute onset of nausea,  vomiting and diarrhea which is likely secondary to viral gastroenteritis  and the symptoms have now improved.  He also needs age-appropriate colon  cancer screening.  Thank you for allowing me to see Mr. Platts in  consultation.  My recommendations follow.  1. The patient is okay to discharge because he is tolerating p.o.'s.      He should follow a lactose-free, low-residue, low-fiber diet for      the next 2 weeks.  2. He should use Phenergan as needed for nausea and vomiting.  3. He should schedule a colonoscopy as an outpatient with the      Osm[facility]Prep.  4. He should maintain his hydration.  I did discuss with him that      adequate hydration means that his urine remains yellow.  5. He should continue his Prilosec  over-the-counter daily.  6. He may follow up with me as an outpatient.      Kassie Mends, M.D.  Electronically Signed     SM/MEDQ  D:  03/13/2006  T:  03/13/2006  Job:  [id number]

## 2010-06-01 NOTE — Group Therapy Note (Signed)
NAMEMETRO, EDENFIELD                  ACCOUNT NO.:  [id number]   MEDICAL RECORD NO.:  [id number]          PATIENT TYPE:  INP   LOCATION:  A318                          FACILITY:  [facility]   PHYSICIAN:  Edward L. Juanetta Gosling, M.D.DATE OF BIRTH:  1955/08/07   DATE OF PROCEDURE:  03/12/2006  DATE OF DISCHARGE:                                 PROGRESS NOTE   PROBLEM:  Abdominal discomfort.   SUBJECTIVE:  Mr. Mackie says he is feeling better.  He has still got some  abdominal discomfort.  He is a little hungry now.   OBJECTIVE:  His physical examination shows that he looks pretty  comfortable.  Temperature is 98.6, pulse 83, respirations 20, blood  pressure 117/68.  His white count is down to 7100, hemoglobin is 15.1.  Electrolytes were normal.  BUN 22, creatinine 1.15, potassium is 4.1.  He is still mildly tender in his abdomen.   ASSESSMENT AND PLAN:  Further history is also that his wife says that he  has had several episodes like this and she thinks that in the past he  said it has only lasted 3-4 hours.  Considering that, I think I am going  to go ahead and ask for GI consultation.  He still may be able to be  discharged later today.  He may not need any further workup and I will  discuss that with the GI team. I will also discussed with them whether I  can  go ahead and let him have something to eat today.      Edward L. Juanetta Gosling, M.D.  Electronically Signed     ELH/MEDQ  D:  03/12/2006  T:  03/12/2006  Job:  [id number]

## 2010-06-01 NOTE — Discharge Summary (Signed)
Kenneth [name], Kenneth [name]                  ACCOUNT NO.:  [id number]   MEDICAL RECORD NO.:  [id number]          PATIENT TYPE:  INP   LOCATION:  A318                          FACILITY:  [facility]   PHYSICIAN:  Edward L. Juanetta Gosling, M.D.DATE OF BIRTH:  23-Jul-1955   DATE OF ADMISSION:  03/11/2006  DATE OF DISCHARGE:  02/27/2008LH                               DISCHARGE SUMMARY   FINAL DIAGNOSES:  1. Abdominal pain probably related to acute viral gastroenteritis.  2. Hyperlipidemia.  3. Gastroesophageal reflux disease.  4. Osteoarthritis.   HISTORY OF PRESENT ILLNESS:  Kenneth [name] is a 55 year old who was in his  usual state of good health when he developed lower abdominal pain and  diarrhea about 12 hours prior to admission.  He continued to have  abdominal pain, continued having problems and came to the emergency room  where he was evaluated, had two more episodes of vomiting and was  brought into the hospital because he had continued nausea, vomiting and  abdominal pain and had leukocytosis to about 11,000.   PHYSICAL EXAM:  VITAL SIGNS:  Showed temperature 97.6, blood pressure  129/82, pulse 88, respirations 20, weight 189.8.  GENERAL:  He did not appear to be in acute distress.  CHEST:  His chest was fairly clear.  His heart was regular.  ABDOMEN:  His abdomen mildly diffusely tender mostly in the lower  quadrants bilaterally.  He had no epigastric tenderness.   LABORATORY DATA:  White count was 11,700 with left shift.   HOSPITAL COURSE:  He was treated with IV fluids, given pain medications  and had a CT which did not show any specific findings.  I asked for GI  consultation and Kassie Mends, M.D. saw him and felt that he had a  gastroenteritis and that he could be discharged home on a low-residue,  lactose-free diet with Phenergan for nausea if needed and then follow  up.  Because he is 50 he is going to need a colonoscopy at some point.      Edward L. Juanetta Gosling, M.D.  Electronically  Signed     ELH/MEDQ  D:  03/12/2006  T:  03/13/2006  Job:  [id number]

## 2010-07-12 DIAGNOSIS — Z0271 Encounter for disability determination: Secondary | ICD-10-CM

## 2011-02-06 DIAGNOSIS — IMO0002 Reserved for concepts with insufficient information to code with codable children: Secondary | ICD-10-CM | POA: Diagnosis not present

## 2011-02-06 DIAGNOSIS — S143XXA Injury of brachial plexus, initial encounter: Secondary | ICD-10-CM | POA: Diagnosis not present

## 2011-02-06 DIAGNOSIS — G894 Chronic pain syndrome: Secondary | ICD-10-CM | POA: Diagnosis not present

## 2011-02-06 DIAGNOSIS — M79609 Pain in unspecified limb: Secondary | ICD-10-CM | POA: Diagnosis not present

## 2011-03-01 DIAGNOSIS — R05 Cough: Secondary | ICD-10-CM | POA: Diagnosis not present

## 2011-03-01 DIAGNOSIS — E785 Hyperlipidemia, unspecified: Secondary | ICD-10-CM | POA: Diagnosis not present

## 2011-03-07 DIAGNOSIS — M5030 Other cervical disc degeneration, unspecified cervical region: Secondary | ICD-10-CM | POA: Diagnosis not present

## 2011-03-12 DIAGNOSIS — M5030 Other cervical disc degeneration, unspecified cervical region: Secondary | ICD-10-CM | POA: Diagnosis not present

## 2011-04-03 DIAGNOSIS — S143XXA Injury of brachial plexus, initial encounter: Secondary | ICD-10-CM | POA: Diagnosis not present

## 2011-04-03 DIAGNOSIS — M79609 Pain in unspecified limb: Secondary | ICD-10-CM | POA: Diagnosis not present

## 2011-04-03 DIAGNOSIS — G894 Chronic pain syndrome: Secondary | ICD-10-CM | POA: Diagnosis not present

## 2011-04-03 DIAGNOSIS — IMO0002 Reserved for concepts with insufficient information to code with codable children: Secondary | ICD-10-CM | POA: Diagnosis not present

## 2011-06-26 DIAGNOSIS — I739 Peripheral vascular disease, unspecified: Secondary | ICD-10-CM | POA: Diagnosis not present

## 2011-06-26 DIAGNOSIS — E785 Hyperlipidemia, unspecified: Secondary | ICD-10-CM | POA: Diagnosis not present

## 2011-06-26 DIAGNOSIS — R079 Chest pain, unspecified: Secondary | ICD-10-CM | POA: Diagnosis not present

## 2011-06-26 DIAGNOSIS — I10 Essential (primary) hypertension: Secondary | ICD-10-CM | POA: Diagnosis not present

## 2011-08-06 DIAGNOSIS — G894 Chronic pain syndrome: Secondary | ICD-10-CM | POA: Diagnosis not present

## 2011-08-06 DIAGNOSIS — M79609 Pain in unspecified limb: Secondary | ICD-10-CM | POA: Diagnosis not present

## 2011-08-06 DIAGNOSIS — S143XXA Injury of brachial plexus, initial encounter: Secondary | ICD-10-CM | POA: Diagnosis not present

## 2011-08-06 DIAGNOSIS — M47812 Spondylosis without myelopathy or radiculopathy, cervical region: Secondary | ICD-10-CM | POA: Diagnosis not present

## 2011-09-10 DIAGNOSIS — M5030 Other cervical disc degeneration, unspecified cervical region: Secondary | ICD-10-CM | POA: Diagnosis not present

## 2011-09-19 DIAGNOSIS — M5030 Other cervical disc degeneration, unspecified cervical region: Secondary | ICD-10-CM | POA: Diagnosis not present

## 2011-09-24 DIAGNOSIS — M5030 Other cervical disc degeneration, unspecified cervical region: Secondary | ICD-10-CM | POA: Diagnosis not present

## 2011-09-30 DIAGNOSIS — M4800 Spinal stenosis, site unspecified: Secondary | ICD-10-CM | POA: Diagnosis not present

## 2011-09-30 DIAGNOSIS — I10 Essential (primary) hypertension: Secondary | ICD-10-CM | POA: Diagnosis not present

## 2011-09-30 DIAGNOSIS — I739 Peripheral vascular disease, unspecified: Secondary | ICD-10-CM | POA: Diagnosis not present

## 2011-10-01 DIAGNOSIS — M5030 Other cervical disc degeneration, unspecified cervical region: Secondary | ICD-10-CM | POA: Diagnosis not present

## 2011-10-01 DIAGNOSIS — M47812 Spondylosis without myelopathy or radiculopathy, cervical region: Secondary | ICD-10-CM | POA: Diagnosis not present

## 2011-10-17 ENCOUNTER — Encounter (HOSPITAL_COMMUNITY): Payer: Self-pay | Admitting: Pharmacy Technician

## 2011-10-21 ENCOUNTER — Other Ambulatory Visit (HOSPITAL_COMMUNITY): Payer: Self-pay

## 2011-10-21 ENCOUNTER — Encounter (HOSPITAL_COMMUNITY)
Admission: RE | Admit: 2011-10-21 | Discharge: 2011-10-21 | Disposition: A | Discharge status: Home / Self Care | Payer: BC Managed Care – PPO | Source: Ambulatory Visit | Attending: Physician Assistant | Admitting: Physician Assistant

## 2011-10-21 ENCOUNTER — Encounter (HOSPITAL_COMMUNITY)
Admission: RE | Admit: 2011-10-21 | Discharge: 2011-10-21 | Disposition: A | Discharge status: Home / Self Care | Payer: BC Managed Care – PPO | Source: Ambulatory Visit | Attending: Orthopedic Surgery | Admitting: Orthopedic Surgery

## 2011-10-21 ENCOUNTER — Encounter (HOSPITAL_COMMUNITY): Payer: Self-pay

## 2011-10-21 DIAGNOSIS — M4712 Other spondylosis with myelopathy, cervical region: Secondary | ICD-10-CM | POA: Diagnosis present

## 2011-10-21 DIAGNOSIS — M5030 Other cervical disc degeneration, unspecified cervical region: Secondary | ICD-10-CM | POA: Diagnosis not present

## 2011-10-21 HISTORY — DX: Depression, unspecified: F32.A

## 2011-10-21 HISTORY — DX: Other specified postprocedural states: R11.2

## 2011-10-21 HISTORY — DX: Unspecified mononeuropathy of unspecified upper limb: G56.90

## 2011-10-21 HISTORY — DX: Occlusion and stenosis of unspecified carotid artery: I65.29

## 2011-10-21 HISTORY — DX: Unspecified nephritic syndrome with unspecified morphologic changes: N05.9

## 2011-10-21 HISTORY — DX: Other specified postprocedural states: Z98.890

## 2011-10-21 HISTORY — DX: Fibromyalgia: M79.7

## 2011-10-21 HISTORY — DX: Gastro-esophageal reflux disease without esophagitis: K21.9

## 2011-10-21 HISTORY — DX: Pneumonia, unspecified organism: J18.9

## 2011-10-21 HISTORY — DX: Myoneural disorder, unspecified: G70.9

## 2011-10-21 HISTORY — DX: Complex regional pain syndrome I of unspecified upper limb: G90.519

## 2011-10-21 HISTORY — DX: Chronic pain syndrome: G89.4

## 2011-10-21 HISTORY — DX: Snoring: R06.83

## 2011-10-21 HISTORY — DX: Major depressive disorder, single episode, unspecified: F32.9

## 2011-10-21 HISTORY — DX: Personal history of other diseases of the nervous system and sense organs: Z86.69

## 2011-10-21 HISTORY — DX: Anxiety disorder, unspecified: F41.9

## 2011-10-21 HISTORY — DX: Insomnia due to medical condition: G47.01

## 2011-10-21 HISTORY — DX: Other pulmonary collapse: J98.19

## 2011-10-21 LAB — CBC
MCH: 32.3 pg (ref 26.0–34.0)
MCHC: 36.3 g/dL — ABNORMAL HIGH (ref 30.0–36.0)
Platelets: 115 10*3/uL — ABNORMAL LOW (ref 150–400)
RBC: 4.31 MIL/uL (ref 4.22–5.81)

## 2011-10-21 LAB — COMPREHENSIVE METABOLIC PANEL
BUN: 14 mg/dL (ref 6–23)
CO2: 27 mEq/L (ref 19–32)
Calcium: 9.5 mg/dL (ref 8.4–10.5)
Creatinine, Ser: 1.28 mg/dL (ref 0.50–1.35)
GFR calc Af Amer: 71 mL/min — ABNORMAL LOW (ref >90)
GFR calc non Af Amer: 61 mL/min — ABNORMAL LOW (ref >90)
Glucose, Bld: 101 mg/dL — ABNORMAL HIGH (ref 70–99)
Total Protein: 7.1 g/dL (ref 6.0–8.3)

## 2011-10-21 LAB — SURGICAL PCR SCREEN
MRSA, PCR: NEGATIVE
Staphylococcus aureus: POSITIVE — AB

## 2011-10-21 LAB — PROTIME-INR
INR: 1.02 (ref 0.00–1.49)
Prothrombin Time: 13.3 seconds (ref 11.6–15.2)

## 2011-10-21 NOTE — Pre-Procedure Instructions (Signed)
[address]EN  10/21/2011   Your procedure is scheduled on:  Thursday, October 10  Report to [facility] at 0930 AM.  Call this number if you have problems the morning of surgery: [phone]   Remember:   Do not eat food or drink liquids:After Midnight.  .  Take these medicines the morning of surgery with A SIP OF WATER: Oxycodone, wear pain patch,Aciphex,Lyrica   Do not wear jewelry, make-up or nail polish.  Do not wear lotions, powders, or perfumes. You may wear deodorant.  Do not shave 48 hours prior to surgery. Men may shave face and neck.  Do not bring valuables to the hospital.  Contacts, dentures or bridgework may not be worn into surgery.  Leave suitcase in the car. After surgery it may be brought to your room.  For patients admitted to the hospital, checkout time is 11:00 AM the day of discharge.   Patients discharged the day of surgery will not be allowed to drive home.  Name and phone number of your driver: n/a  Special Instructions: Incentive Spirometry - Practice and bring it with you on the day of surgery. Shower using CHG 2 nights before surgery and the night before surgery.  If you shower the day of surgery use CHG.  Use special wash - you have one bottle of CHG for all showers.  You should use approximately 1/3 of the bottle for each shower.   Please read over the following fact sheets that you were given: Pain Booklet, Coughing and Deep Breathing, MRSA Information and Surgical Site Infection Prevention

## 2011-10-21 NOTE — H&P (Signed)
Kenneth [name] 10/21/2011 8:13 AM Location: [facility] Patient #: [id number] DOB: Apr 04, 1955 Married / Language: [place] / Race: White Male   History of Present Illness(Jatasia Gundrum Dierdre Highman, PA-C; 10/21/2011 8:29 AM) The patient is a 56 year old male who comes in today for a preoperative History and Physical. 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 Please see the hospital record for complete dictated history and physical.    Problem List/Past Medical(Cheryl Stabenow J [facility], PA-C; 10/21/2011 10:31 AM) Cervical spinal stenosis (723.0) Pain in joint, lower leg (719.46). 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 08/20/2006   Allergies(Sharon J Roxan Hockey; 10/21/2011 8:14 AM) No Known Allergies. 03/07/2011   Family History(Sharon J Roxan Hockey; 10/21/2011 8:14 AM) Cerebrovascular Accident. father Depression. mother and father Drug / Alcohol Addiction. sister and brother Heart Disease. grandfather fathers side Hypertension. sister   Social History(Sharon Gillian Shields; 10/21/2011 8:14 AM) Alcohol use. never consumed alcohol Children. 2 Current work status. disabled Drug/Alcohol Rehab (Currently). no Exercise. Exercises daily; does running / walking Illicit drug use. no Living situation. live with spouse Marital status. 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 2-3 Pain Contract. yes Previously in rehab. no Tobacco use. former smoker; smoke(d) 3 or more pack(s) per day; uses 2 or more can(s) smokeless per week Tobacco / smoke exposure. no   Medication History(Sharon Gillian Shields; 10/21/2011 8:18 AM) Metanx (3-35-2MG  Tablet, 2 Oral daily) Active. Lyrica (75MG  Capsule, 3 Oral daily) Active. OxyCODONE HCl (5MG  Tablet, Oral) Active. FentaNYL (25MCG/HR Patch 72HR, 1 Transdermal every two days)  Active. Aciphex (20MG  Tablet DR, 1 Oral daily) Active. ALPRAZolam ER (0.5MG  Tablet ER 24HR, Oral) Active. Zolpidem Tartrate ( Oral) Specific dose unknown - Active. Mirtazapine (15MG  Tablet, 1 Oral daily) Active. Aspirin EC Low Strength (81MG  Tablet DR, Oral) Active. Omega 3 (1000MG  Capsule, Oral) Active. ZyrTEC ( Oral) Specific dose unknown - Active. Naproxen Sodium (220MG  Capsule, Oral) Active. Cinnamon ( Oral) Specific dose unknown - Active. Acidophilus ( Oral) Active.   Past Surgical History(Rameses Ou J [facility], PA-C; 10/21/2011 10:31 AM) Other Surgery. Top rib removal (left) partial rib removal (right) collapsed lung (right) partial collapse lung (ri   Other Problems(Aaryan Essman J Shanavia Makela, PA-C; 10/21/2011 10:31 AM) Anxiety Disorder Blood Clot Hypercholesterolemia Gastroesophageal Reflux Disease Chronic Pain Depression Fibromyalgia   Review of Systems(Dashan Chizmar J Dayvion Sans, PA-C; 10/21/2011 10:31 AM) General:Present- Chills and Fever. Not Present- Night Sweats, Appetite Loss, Fatigue, Feeling sick, Weight Gain and Weight Loss. Skin:Not Present- Itching, Rash, Skin Color Changes, Ulcer, Psoriasis and Change in Hair or Nails. HEENT:Not Present- Sensitivity to light, Hearing problems, Nose Bleed and Ringing in the Ears. Neck:Not Present- Swollen Glands and Neck Mass. Respiratory:Present- Snoring. Not Present- Chronic Cough, Bloody sputum and Dyspnea. Cardiovascular:Not Present- Shortness of Breath, Chest Pain, Swelling of Extremities, Leg Cramps and Palpitations. Gastrointestinal:Present- Heartburn. Not Present- Bloody Stool, Abdominal Pain, Vomiting, Nausea and Incontinence of Stool. Male Genitourinary:Not Present- Blood in Urine, Frequency, Incontinence and Nocturia. Musculoskeletal:Present- Muscle Weakness, Muscle Pain, Joint Stiffness, Joint Swelling, Joint Pain and Back Pain. Neurological:Present- Tingling, Numbness, Burning and Dizziness. Not Present- Tremor and  Headaches. Psychiatric:Present- Anxiety, Depression and Memory Loss. Endocrine:Not Present- Cold Intolerance, Heat Intolerance, Excessive hunger and Excessive Thirst. Hematology:Not Present- Abnormal Bleeding, Anemia, Blood Clots and Easy Bruising.   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 Not in acute distress. Orientation- Oriented X3. Build & Nutrition- Well nourished and Well developed. Posture- Normal posture. Gait- Normal. Mental Status- Alert.   Integumentary General Characteristics:Surgical Scars- no surgical scar evidence of previous cervical surgery. Cervical Spine- Skin examination of the cervical spine is without deformity, skin lesions, lacerations or abrasions.   Head and Neck Neck Global Assessment- supple. no lymphadenopathy and no nucchal rigidty.   Eye Pupil- Bilateral- Normal, Direct reaction to light normal, Equal and Regular. Motion- Bilateral- EOMI.   Chest and Lung Exam Auscultation: Breath sounds:- Clear.   Cardiovascular Auscultation:Rhythm- Regular rate and rhythm. Heart Sounds- Normal heart sounds.   Abdomen Palpation/Percussion:Palpation and Percussion of the abdomen reveal - Non Tender, No Rebound tenderness and Soft.   Peripheral Vascular Upper Extremity: Palpation:Radial pulse- Bilateral- 2+. Lower Extremity:Inspection- Bilateral- Inspection Normal. Palpation:Posterior tibial pulse- Bilateral- 1+. Dorsalis pedis pulse- Bilateral-  1+.   Neurologic Sensation:Upper Extremity- Bilateral- sensation is intact in the upper extremity. Reflexes:Biceps Reflex- Bilateral- 2+. Brachioradialis Reflex- Bilateral- 2+. Triceps Reflex- Bilateral- 2+. Babinski- Bilateral- Babinski not present. Clonus- Bilateral- clonus not present. Hoffman's Sign- Bilateral- Hoffman's sign not present. Testing:   Musculoskeletal Spine/Ribs/Pelvis Cervical Spine : Inspection and Palpation:Tenderness- no soft tissue tenderness to palpation and no bony tenderness to palpation. bony/soft tissue palpation of the cervical spine and shoulders does not recreate their typical pain. Strength and Tone: Strength:Deltoid- Left- 4+/5. Right- 5/5. Biceps- Left- 5/5. Right- 5/5. Triceps- Bilateral- 5/5. Wrist Extension- Bilateral- 5/5. Hand Grip- Bilateral- 5/5. Heel walk- Bilateral- able to heel walk without difficulty. Toe Walk- Bilateral- able to walk on toes without difficulty. Heel-Toe Walk- Bilateral- able to heel-toe walk without difficulty. ROM- Flexion- Mildly decreased and painful. Extension- Mildly decreased and painful. Left Rotation - Mildly decreased. Right Rotation - Mildly decreased. Pain:- neither flexion or extension is more painful than the other. Special Testing- axial compression test negative and cross chest impingement test negative. Non-Anatomic Signs- No non-anatomic signs present. Upper Extremity Range of Motion:- No truesholder pain with IR/ER of the shoulders.   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 Risks/benefits/alternatives to the  procedure/expectations following the procedure have been reviewed with the patient by Dr. Shon [name]. He understands.   MRI from 09/19/11 demonstrates the myelomalacia at 3-4 has increased in size slightly and the left disk protrusion at 4-5 is increased slightly. He still has disease at 5-6 but it is only mild central stenosis and mild-to-moderate foraminal stenosis there is still CFS signal throughout that level.  He has been cleared for the procedure by his PCP, Dr. Juanetta Gosling. Please see the accompanying form scanned into the chart. He is scheduled to be fitted for an ASPEN collar later this morning. He knows to bring the collar with him the morning of surgery. He is also scheduled to complete his pre-op hospital requirements later today. We have reviewed his medications. Given the extensive number of  medications he currently takes I have informed him that likely after surgery we will defer continuation of his medications back to his pain specialist. He understands.   All of his questions have been encouraged, addressed and answered. Plan, at this time is to proceed with surgery as scheduled.   Signed electronically by Gwinda Maine, PA-C (10/22/2011 8:25 AM)    Kenneth [name] 09/24/2011 4:04 PM Location: [facility] Patient #: [id number] LB DOB: [birth year] Married / Language: [place] / Race: White Male   History of Present Monico Hoar, MD; 09/24/2011 4:06 PM) The patient is a 56 year old male who presents today for follow up of their neck. The patient is being followed for their neck pain. and report their pain level to be 7 / 10. The patient presents today following MRI. Note for "Follow-up Neck": pain management     Subjective Transcription(DAHARI Sheela Stack, MD; 09/26/2011 4:23 PM)  Kenneth [name] returns today for followup. Unfortunately, his overall pain has progressively gotten worse. He states and his wife confirms that his balance issues are more  prominent and overall he is deteriorating with respect to his fucntion.    Allergies(DAHARI Sheela Stack, MD; 09/24/2011 4:06 PM) No Known Allergies. 03/07/2011   Social History(DAHARI D BROOKS, MD; 09/24/2011 4:06 PM) Tobacco / smoke exposure. no   Medication History(DAHARI D BROOKS, MD; 09/24/2011 4:06 PM) Mirtazapine (15MG  Tablet, 1 Oral daily) Active. Metanx (3-35-2MG  Tablet, 2 Oral daily) Active. Zolpidem Tartrate ( Oral) Specific dose unknown - Active. Xanax (0.5MG  Tablet, 3 Oral daily) Active. Lyrica (75MG  Capsule, 3 Oral daily) Active. Aciphex (20MG  Tablet DR, 1 Oral daily) Active. FentaNYL (25MCG/HR Patch 72HR, 1 Transdermal every two days) Active. OxyCODONE HCl (5MG  Tablet, Oral) Active.   Objective Transcription(DAHARI Sheela Stack, MD; 09/26/2011 4:23 PM)  There is no significant change from his clinical examination on September 10, 2011. At that time, he was having significant neck and left arm symptoms. 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  IMAGES:  MRI from September 19, 2011, demonstrates the myelomalacia at 3-4 has increased in size slightly and the left disk protrusion at 4-5 is increased slightly. He still has disease at 5-6 but it is only mild central stenosis and mild-to-moderate foraminal stenosis there is still CFS signal throughout that level.    Assessments Transcription(DAHARI Sheela Stack, MD; 09/26/2011 4:23 PM)  At this point in time, clinically he feels as though he is deteriorating. His gait pattern has definitely gotten somewhat worse and his dexterity and hand grip strenght is also deteriorating. At this point, we had a very lengthy discussion about surgical intervention.    Plans Transcription(DAHARI Sheela Stack, MD; 09/26/2011 4:23 PM)  I would recommend a two-level ACDF 3-4 and 4-5. This will allow direct decompression of the 3-4 myelomalacia level as well as removing the slightly progressive left lateral disk  which could be causing more C5 irritation and explain some of his increased trapezial and lateral deltoid pain. I have reviewed the risks with the patient and his wife which include infection, bleeding, nerve damage, death, stroke, paralysis, failure to heal, need for further surgery, ongoing or worse pain, loss of bowel and bladder control, throat pain, swallowing difficulties, hoarseness in the voice. All of their questions were addressed. We will get preoperative medical clearance. I will see the patient back for a preoperative history and physical and we will plan to do this in the very near future.    Miscellaneous Transcription(DAHARI  Sheela Stack, MD; 09/26/2011 4:23 PM)  Kenneth Garret D. Shon Baton, MD/kro  T: 09/26/2011  D: 09/24/2011    Signed electronically by Alvy Beal, MD (09/24/2011 5:37 PM)

## 2011-10-23 MED ORDER — CEFAZOLIN SODIUM-DEXTROSE 2-3 GM-% IV SOLR
2.0000 g | INTRAVENOUS | Status: AC
  Administered 2011-10-24: 2 g via INTRAVENOUS
  Filled 2011-10-23: qty 50

## 2011-10-23 MED ORDER — LACTATED RINGERS IV SOLN
INTRAVENOUS | Status: DC
  Administered 2011-10-24: 11:00:00 via INTRAVENOUS

## 2011-10-24 ENCOUNTER — Encounter (HOSPITAL_COMMUNITY): Payer: Self-pay | Admitting: *Deleted

## 2011-10-24 ENCOUNTER — Ambulatory Visit (HOSPITAL_COMMUNITY): Payer: BC Managed Care – PPO | Admitting: Anesthesiology

## 2011-10-24 ENCOUNTER — Observation Stay (HOSPITAL_COMMUNITY): Payer: BC Managed Care – PPO

## 2011-10-24 ENCOUNTER — Encounter (HOSPITAL_COMMUNITY)
Admission: RE | Disposition: A | Discharge status: Home / Self Care | Payer: Self-pay | Source: Home / Self Care | Attending: Orthopedic Surgery

## 2011-10-24 ENCOUNTER — Encounter (HOSPITAL_COMMUNITY): Payer: Self-pay | Admitting: Anesthesiology

## 2011-10-24 ENCOUNTER — Encounter (HOSPITAL_COMMUNITY): Payer: Self-pay | Admitting: General Practice

## 2011-10-24 ENCOUNTER — Observation Stay (HOSPITAL_COMMUNITY)
Admission: RE | Admit: 2011-10-24 | Discharge: 2011-10-25 | Disposition: A | Discharge status: Home / Self Care | Payer: BC Managed Care – PPO | Attending: Orthopedic Surgery | Admitting: Orthopedic Surgery

## 2011-10-24 DIAGNOSIS — Z01812 Encounter for preprocedural laboratory examination: Secondary | ICD-10-CM | POA: Insufficient documentation

## 2011-10-24 DIAGNOSIS — K219 Gastro-esophageal reflux disease without esophagitis: Secondary | ICD-10-CM | POA: Insufficient documentation

## 2011-10-24 DIAGNOSIS — G8929 Other chronic pain: Secondary | ICD-10-CM | POA: Diagnosis not present

## 2011-10-24 DIAGNOSIS — M5030 Other cervical disc degeneration, unspecified cervical region: Secondary | ICD-10-CM | POA: Diagnosis not present

## 2011-10-24 DIAGNOSIS — M4712 Other spondylosis with myelopathy, cervical region: Principal | ICD-10-CM | POA: Diagnosis present

## 2011-10-24 DIAGNOSIS — Z01811 Encounter for preprocedural respiratory examination: Secondary | ICD-10-CM | POA: Insufficient documentation

## 2011-10-24 DIAGNOSIS — Z01818 Encounter for other preprocedural examination: Secondary | ICD-10-CM | POA: Insufficient documentation

## 2011-10-24 DIAGNOSIS — Z01810 Encounter for preprocedural cardiovascular examination: Secondary | ICD-10-CM | POA: Insufficient documentation

## 2011-10-24 DIAGNOSIS — IMO0001 Reserved for inherently not codable concepts without codable children: Secondary | ICD-10-CM | POA: Diagnosis not present

## 2011-10-24 HISTORY — DX: Complex regional pain syndrome I, unspecified: G90.50

## 2011-10-24 HISTORY — DX: Personal history of other medical treatment: Z92.89

## 2011-10-24 HISTORY — PX: ANTERIOR CERVICAL DECOMP/DISCECTOMY FUSION: SHX1161

## 2011-10-24 SURGERY — ANTERIOR CERVICAL DECOMPRESSION/DISCECTOMY FUSION 2 LEVELS
Anesthesia: General | Site: Spine Cervical | Wound class: Clean

## 2011-10-24 MED ORDER — LORATADINE 10 MG PO TABS
10.0000 mg | ORAL_TABLET | Freq: Every day | ORAL | Status: DC
  Administered 2011-10-24 – 2011-10-25 (×2): 10 mg via ORAL
  Filled 2011-10-24 (×2): qty 1

## 2011-10-24 MED ORDER — LACTATED RINGERS IV SOLN
INTRAVENOUS | Status: DC | PRN
  Administered 2011-10-24 (×2): via INTRAVENOUS

## 2011-10-24 MED ORDER — GLYCOPYRROLATE 0.2 MG/ML IJ SOLN
INTRAMUSCULAR | Status: DC | PRN
  Administered 2011-10-24: 0.6 mg via INTRAVENOUS

## 2011-10-24 MED ORDER — MIDAZOLAM HCL 5 MG/5ML IJ SOLN
INTRAMUSCULAR | Status: DC | PRN
  Administered 2011-10-24: 2 mg via INTRAVENOUS

## 2011-10-24 MED ORDER — CEFAZOLIN SODIUM 1-5 GM-% IV SOLN
1.0000 g | Freq: Three times a day (TID) | INTRAVENOUS | Status: AC
  Administered 2011-10-24 – 2011-10-25 (×2): 1 g via INTRAVENOUS
  Filled 2011-10-24 (×2): qty 50

## 2011-10-24 MED ORDER — ACETAMINOPHEN 10 MG/ML IV SOLN
1000.0000 mg | Freq: Once | INTRAVENOUS | Status: AC
  Administered 2011-10-24: 1000 mg via INTRAVENOUS
  Filled 2011-10-24: qty 100

## 2011-10-24 MED ORDER — LIDOCAINE HCL (CARDIAC) 20 MG/ML IV SOLN
INTRAVENOUS | Status: DC | PRN
  Administered 2011-10-24: 75 mg via INTRAVENOUS

## 2011-10-24 MED ORDER — ONDANSETRON HCL 4 MG/2ML IJ SOLN: 4.0000 mg | INTRAMUSCULAR | Status: DC | PRN

## 2011-10-24 MED ORDER — SODIUM CHLORIDE 0.9 % IJ SOLN
3.0000 mL | Freq: Two times a day (BID) | INTRAMUSCULAR | Status: DC
  Administered 2011-10-24: 3 mL via INTRAVENOUS

## 2011-10-24 MED ORDER — PREGABALIN 50 MG PO CAPS
225.0000 mg | ORAL_CAPSULE | Freq: Two times a day (BID) | ORAL | Status: DC
  Administered 2011-10-24 – 2011-10-25 (×2): 225 mg via ORAL
  Filled 2011-10-24 (×2): qty 4

## 2011-10-24 MED ORDER — OXYCODONE HCL 5 MG/5ML PO SOLN: 5.0000 mg | Freq: Once | ORAL | Status: DC | PRN

## 2011-10-24 MED ORDER — DEXAMETHASONE 4 MG PO TABS
4.0000 mg | ORAL_TABLET | Freq: Four times a day (QID) | ORAL | Status: DC
  Administered 2011-10-25: 4 mg via ORAL
  Filled 2011-10-24 (×6): qty 1

## 2011-10-24 MED ORDER — HYDROMORPHONE HCL PF 1 MG/ML IJ SOLN
INTRAMUSCULAR | Status: AC
  Administered 2011-10-24: 0.5 mg via INTRAVENOUS
  Filled 2011-10-24: qty 1

## 2011-10-24 MED ORDER — ALPRAZOLAM 0.5 MG PO TABS
0.5000 mg | ORAL_TABLET | Freq: Three times a day (TID) | ORAL | Status: DC | PRN
  Administered 2011-10-24: 0.5 mg via ORAL
  Filled 2011-10-24: qty 1

## 2011-10-24 MED ORDER — ZOLPIDEM TARTRATE 5 MG PO TABS: 5.0000 mg | ORAL_TABLET | Freq: Every evening | ORAL | Status: DC | PRN

## 2011-10-24 MED ORDER — METHOCARBAMOL 500 MG PO TABS
500.0000 mg | ORAL_TABLET | Freq: Four times a day (QID) | ORAL | Status: DC | PRN
  Administered 2011-10-25: 500 mg via ORAL
  Filled 2011-10-24: qty 1

## 2011-10-24 MED ORDER — NEOSTIGMINE METHYLSULFATE 1 MG/ML IJ SOLN
INTRAMUSCULAR | Status: DC | PRN
  Administered 2011-10-24: 3 mg via INTRAVENOUS

## 2011-10-24 MED ORDER — THROMBIN 20000 UNITS EX KIT: PACK | CUTANEOUS | Status: DC | PRN

## 2011-10-24 MED ORDER — DEXAMETHASONE SODIUM PHOSPHATE 10 MG/ML IJ SOLN
10.0000 mg | Freq: Once | INTRAMUSCULAR | Status: AC
  Administered 2011-10-24: 10 mg via INTRAVENOUS
  Filled 2011-10-24: qty 1

## 2011-10-24 MED ORDER — SCOPOLAMINE 1 MG/3DAYS TD PT72
1.0000 | MEDICATED_PATCH | TRANSDERMAL | Status: DC
  Administered 2011-10-24: 1.5 mg via TRANSDERMAL

## 2011-10-24 MED ORDER — SODIUM CHLORIDE 0.9 % IV SOLN: 250.0000 mL | INTRAVENOUS | Status: DC

## 2011-10-24 MED ORDER — ACETAMINOPHEN 10 MG/ML IV SOLN
INTRAVENOUS | Status: AC
  Filled 2011-10-24: qty 100

## 2011-10-24 MED ORDER — PHENOL 1.4 % MT LIQD: 1.0000 | OROMUCOSAL | Status: DC | PRN

## 2011-10-24 MED ORDER — MENTHOL 3 MG MT LOZG
1.0000 | LOZENGE | OROMUCOSAL | Status: DC | PRN
  Filled 2011-10-24: qty 9

## 2011-10-24 MED ORDER — THROMBIN 20000 UNITS EX SOLR
CUTANEOUS | Status: AC
  Filled 2011-10-24: qty 20000

## 2011-10-24 MED ORDER — HYDROMORPHONE HCL PF 1 MG/ML IJ SOLN
0.2500 mg | INTRAMUSCULAR | Status: DC | PRN
  Administered 2011-10-24 (×2): 0.5 mg via INTRAVENOUS

## 2011-10-24 MED ORDER — ROCURONIUM BROMIDE 100 MG/10ML IV SOLN
INTRAVENOUS | Status: DC | PRN
  Administered 2011-10-24: 50 mg via INTRAVENOUS

## 2011-10-24 MED ORDER — ACETAMINOPHEN 10 MG/ML IV SOLN
1000.0000 mg | Freq: Four times a day (QID) | INTRAVENOUS | Status: AC
  Administered 2011-10-24 – 2011-10-25 (×4): 1000 mg via INTRAVENOUS
  Filled 2011-10-24 (×3): qty 100

## 2011-10-24 MED ORDER — BUPIVACAINE-EPINEPHRINE 0.25% -1:200000 IJ SOLN
INTRAMUSCULAR | Status: DC | PRN
  Administered 2011-10-24: 5 mL

## 2011-10-24 MED ORDER — 0.9 % SODIUM CHLORIDE (POUR BTL) OPTIME
TOPICAL | Status: DC | PRN
  Administered 2011-10-24: 1000 mL

## 2011-10-24 MED ORDER — LACTATED RINGERS IV SOLN: INTRAVENOUS | Status: DC

## 2011-10-24 MED ORDER — FENTANYL 25 MCG/HR TD PT72: 25.0000 ug | MEDICATED_PATCH | TRANSDERMAL | Status: DC

## 2011-10-24 MED ORDER — SUFENTANIL CITRATE 50 MCG/ML IV SOLN
INTRAVENOUS | Status: DC | PRN
  Administered 2011-10-24 (×2): 10 ug via INTRAVENOUS
  Administered 2011-10-24 (×2): 20 ug via INTRAVENOUS

## 2011-10-24 MED ORDER — OXYCODONE HCL 5 MG PO TABS
10.0000 mg | ORAL_TABLET | ORAL | Status: DC | PRN
  Administered 2011-10-24 – 2011-10-25 (×3): 10 mg via ORAL
  Filled 2011-10-24 (×3): qty 2

## 2011-10-24 MED ORDER — SCOPOLAMINE 1 MG/3DAYS TD PT72
MEDICATED_PATCH | TRANSDERMAL | Status: AC
  Filled 2011-10-24: qty 1

## 2011-10-24 MED ORDER — FLEET ENEMA 7-19 GM/118ML RE ENEM: 1.0000 | ENEMA | Freq: Once | RECTAL | Status: AC | PRN

## 2011-10-24 MED ORDER — HEMOSTATIC AGENTS (NO CHARGE) OPTIME
TOPICAL | Status: DC | PRN
  Administered 2011-10-24: 1 via TOPICAL

## 2011-10-24 MED ORDER — PROPOFOL INFUSION 10 MG/ML OPTIME
INTRAVENOUS | Status: DC | PRN
  Administered 2011-10-24: 75 ug/kg/min via INTRAVENOUS

## 2011-10-24 MED ORDER — LACTATED RINGERS IV SOLN
INTRAVENOUS | Status: DC
  Administered 2011-10-25: 02:00:00 via INTRAVENOUS

## 2011-10-24 MED ORDER — SODIUM CHLORIDE 0.9 % IJ SOLN: 3.0000 mL | INTRAMUSCULAR | Status: DC | PRN

## 2011-10-24 MED ORDER — MORPHINE SULFATE 2 MG/ML IJ SOLN
1.0000 mg | INTRAMUSCULAR | Status: DC | PRN
  Administered 2011-10-24: 2 mg via INTRAVENOUS
  Administered 2011-10-25: 4 mg via INTRAVENOUS
  Administered 2011-10-25: 3 mg via INTRAVENOUS
  Administered 2011-10-25: 2 mg via INTRAVENOUS
  Administered 2011-10-25 (×2): 4 mg via INTRAVENOUS
  Filled 2011-10-24: qty 2
  Filled 2011-10-24: qty 1
  Filled 2011-10-24 (×2): qty 2
  Filled 2011-10-24: qty 1
  Filled 2011-10-24 (×2): qty 2

## 2011-10-24 MED ORDER — DOCUSATE SODIUM 100 MG PO CAPS
100.0000 mg | ORAL_CAPSULE | Freq: Two times a day (BID) | ORAL | Status: DC
  Administered 2011-10-24 – 2011-10-25 (×2): 100 mg via ORAL
  Filled 2011-10-24 (×3): qty 1

## 2011-10-24 MED ORDER — PROPOFOL 10 MG/ML IV BOLUS
INTRAVENOUS | Status: DC | PRN
  Administered 2011-10-24: 200 mg via INTRAVENOUS

## 2011-10-24 MED ORDER — BUPIVACAINE-EPINEPHRINE PF 0.25-1:200000 % IJ SOLN
INTRAMUSCULAR | Status: AC
  Filled 2011-10-24: qty 30

## 2011-10-24 MED ORDER — PANTOPRAZOLE SODIUM 40 MG PO TBEC
40.0000 mg | DELAYED_RELEASE_TABLET | Freq: Every day | ORAL | Status: DC
  Administered 2011-10-25: 40 mg via ORAL
  Filled 2011-10-24: qty 1

## 2011-10-24 MED ORDER — EPHEDRINE SULFATE 50 MG/ML IJ SOLN
INTRAMUSCULAR | Status: DC | PRN
  Administered 2011-10-24: 10 mg via INTRAVENOUS

## 2011-10-24 MED ORDER — MUPIROCIN 2 % EX OINT
1.0000 "application " | TOPICAL_OINTMENT | Freq: Two times a day (BID) | CUTANEOUS | Status: DC
  Administered 2011-10-24 – 2011-10-25 (×2): 1 via NASAL
  Filled 2011-10-24: qty 22

## 2011-10-24 MED ORDER — OXYCODONE HCL 5 MG PO TABS: 5.0000 mg | ORAL_TABLET | Freq: Once | ORAL | Status: DC | PRN

## 2011-10-24 MED ORDER — DEXTROSE 5 % IV SOLN
500.0000 mg | Freq: Four times a day (QID) | INTRAVENOUS | Status: DC | PRN
  Filled 2011-10-24: qty 5

## 2011-10-24 MED ORDER — ONDANSETRON HCL 4 MG/2ML IJ SOLN
INTRAMUSCULAR | Status: DC | PRN
  Administered 2011-10-24: 4 mg via INTRAVENOUS

## 2011-10-24 MED ORDER — DEXAMETHASONE SODIUM PHOSPHATE 4 MG/ML IJ SOLN
4.0000 mg | Freq: Four times a day (QID) | INTRAMUSCULAR | Status: DC
  Administered 2011-10-25 (×2): 4 mg via INTRAVENOUS
  Filled 2011-10-24 (×6): qty 1

## 2011-10-24 MED ORDER — THROMBIN 20000 UNITS EX SOLR
CUTANEOUS | Status: DC | PRN
  Administered 2011-10-24: 13:00:00 via TOPICAL

## 2011-10-24 MED ORDER — HYDROMORPHONE HCL PF 1 MG/ML IJ SOLN
INTRAMUSCULAR | Status: AC
  Filled 2011-10-24: qty 1

## 2011-10-24 SURGICAL SUPPLY — 69 items
3.5X14MM RETAINING SCREWS ×2 IMPLANT
ADH SKN CLS APL DERMABOND .7 (GAUZE/BANDAGES/DRESSINGS) ×1
BLADE SURG ROTATE 9660 (MISCELLANEOUS) IMPLANT
BUR EGG ELITE 4.0 (BURR) IMPLANT
BUR MATCHSTICK NEURO 3.0 LAGG (BURR) ×1 IMPLANT
CANISTER SUCTION 2500CC (MISCELLANEOUS) ×2 IMPLANT
CLOTH BEACON ORANGE TIMEOUT ST (SAFETY) ×2 IMPLANT
CLSR STERI-STRIP ANTIMIC 1/2X4 (GAUZE/BANDAGES/DRESSINGS) ×1 IMPLANT
COLLAR CERV LO CONTOUR FIRM DE (SOFTGOODS) IMPLANT
CORDS BIPOLAR (ELECTRODE) ×2 IMPLANT
COVER SURGICAL LIGHT HANDLE (MISCELLANEOUS) ×4 IMPLANT
CRADLE DONUT ADULT HEAD (MISCELLANEOUS) ×2 IMPLANT
DERMABOND ADVANCED (GAUZE/BANDAGES/DRESSINGS) ×1
DERMABOND ADVANCED .7 DNX12 (GAUZE/BANDAGES/DRESSINGS) ×1 IMPLANT
DEVICE ENDSKLTN MED 6 7MM (Orthopedic Implant) IMPLANT
DEVICE ENDSKLTN TC MED 8MM (Orthopedic Implant) IMPLANT
DRAPE C-ARM 42X72 X-RAY (DRAPES) ×2 IMPLANT
DRAPE POUCH INSTRU U-SHP 10X18 (DRAPES) ×2 IMPLANT
DRAPE SURG 17X23 STRL (DRAPES) ×2 IMPLANT
DRAPE U-SHAPE 47X51 STRL (DRAPES) ×2 IMPLANT
DRSG MEPILEX BORDER 4X4 (GAUZE/BANDAGES/DRESSINGS) ×2 IMPLANT
DURAPREP 26ML APPLICATOR (WOUND CARE) ×2 IMPLANT
ELECT COATED BLADE 2.86 ST (ELECTRODE) ×2 IMPLANT
ELECT REM PT RETURN 9FT ADLT (ELECTROSURGICAL) ×2
ELECTRODE REM PT RTRN 9FT ADLT (ELECTROSURGICAL) ×1 IMPLANT
ENDOSKELETON MED 6 7MM (Orthopedic Implant) ×2 IMPLANT
ENDOSKELTON TC IMPLANT 8MM MED (Orthopedic Implant) ×2 IMPLANT
GLOVE BIOGEL PI IND STRL 6.5 (GLOVE) ×1 IMPLANT
GLOVE BIOGEL PI IND STRL 8.5 (GLOVE) ×1 IMPLANT
GLOVE BIOGEL PI INDICATOR 6.5 (GLOVE) ×1
GLOVE BIOGEL PI INDICATOR 8.5 (GLOVE) ×1
GLOVE ECLIPSE 6.0 STRL STRAW (GLOVE) ×2 IMPLANT
GLOVE ECLIPSE 8.5 STRL (GLOVE) ×2 IMPLANT
GOWN PREVENTION PLUS XXLARGE (GOWN DISPOSABLE) ×3 IMPLANT
GOWN STRL NON-REIN LRG LVL3 (GOWN DISPOSABLE) ×4 IMPLANT
KIT BASIN OR (CUSTOM PROCEDURE TRAY) ×2 IMPLANT
KIT ROOM TURNOVER OR (KITS) ×2 IMPLANT
LIMB HOLDERS ×1 IMPLANT
NDL SPNL 18GX3.5 QUINCKE PK (NEEDLE) ×1 IMPLANT
NEEDLE SPNL 18GX3.5 QUINCKE PK (NEEDLE) ×2 IMPLANT
NS IRRIG 1000ML POUR BTL (IV SOLUTION) ×2 IMPLANT
PACK ORTHO CERVICAL (CUSTOM PROCEDURE TRAY) ×2 IMPLANT
PACK UNIVERSAL I (CUSTOM PROCEDURE TRAY) ×2 IMPLANT
PAD ARMBOARD 7.5X6 YLW CONV (MISCELLANEOUS) ×4 IMPLANT
PATTIES SURGICAL .25X.25 (GAUZE/BANDAGES/DRESSINGS) ×1 IMPLANT
PLATE VECTRA 36MM (Plate) ×1 IMPLANT
PUTTY BONE DBX 5CC MIX (Putty) ×1 IMPLANT
SCREW 4.0X14MM (Screw) ×8 IMPLANT
SCREW 4.0X16MM (Screw) ×2 IMPLANT
SCREW BN 14X4XSLF DRL VA SLF (Screw) IMPLANT
SPONGE INTESTINAL PEANUT (DISPOSABLE) ×2 IMPLANT
SPONGE LAP 4X18 X RAY DECT (DISPOSABLE) ×1 IMPLANT
SPONGE SURGIFOAM ABS GEL 100 (HEMOSTASIS) ×2 IMPLANT
STRIP CLOSURE SKIN 1/2X4 (GAUZE/BANDAGES/DRESSINGS) ×2 IMPLANT
SURGIFLO TRUKIT (HEMOSTASIS) ×1 IMPLANT
SUT MNCRL AB 3-0 PS2 18 (SUTURE) ×2 IMPLANT
SUT SILK 2 0 (SUTURE) ×2
SUT SILK 2-0 18XBRD TIE 12 (SUTURE) ×1 IMPLANT
SUT VIC AB 2-0 CT1 18 (SUTURE) ×2 IMPLANT
SUT VIC AB 3-0 54X BRD REEL (SUTURE) IMPLANT
SUT VIC AB 3-0 BRD 54 (SUTURE)
SYR BULB IRRIGATION 50ML (SYRINGE) ×2 IMPLANT
SYR CONTROL 10ML LL (SYRINGE) ×2 IMPLANT
TAPE CLOTH 4X10 WHT NS (GAUZE/BANDAGES/DRESSINGS) ×1 IMPLANT
TAPE UMBILICAL COTTON 1/8X30 (MISCELLANEOUS) ×2 IMPLANT
TOWEL OR 17X24 6PK STRL BLUE (TOWEL DISPOSABLE) ×2 IMPLANT
TOWEL OR 17X26 10 PK STRL BLUE (TOWEL DISPOSABLE) ×2 IMPLANT
TRAY FOLEY CATH 14FR (SET/KITS/TRAYS/PACK) ×1 IMPLANT
WATER STERILE IRR 1000ML POUR (IV SOLUTION) ×1 IMPLANT

## 2011-10-24 NOTE — Anesthesia Postprocedure Evaluation (Signed)
  Anesthesia Post-op Note  Patient: Kenneth [name]  Procedure(s) Performed: Procedure(s) (LRB) with comments: ANTERIOR CERVICAL DECOMPRESSION/DISCECTOMY FUSION 2 LEVELS (N/A) - ACDF C3-5 C-ARM SKYTRON TABLE SYNTHES VECTOR TITAN CAGE NEURO MONITORING SYTEM  Patient Location: PACU  Anesthesia Type: General  Level of Consciousness: awake  Airway and Oxygen Therapy: Patient Spontanous Breathing  Post-op Pain: mild  Post-op Assessment: Post-op Vital signs reviewed  Post-op Vital Signs: Reviewed  Complications: No apparent anesthesia complications

## 2011-10-24 NOTE — Anesthesia Preprocedure Evaluation (Addendum)
Anesthesia Evaluation  Patient identified by MRN, date of birth, ID band Patient awake    Reviewed: Allergy & Precautions, H&P , NPO status , Patient's Chart, lab work & pertinent test results  History of Anesthesia Complications (+) PONV  Airway Mallampati: III TM Distance: >3 FB Neck ROM: Limited    Dental No notable dental hx. (+) Teeth Intact and Dental Advisory Given   Pulmonary neg pulmonary ROS, former smoker,  breath sounds clear to auscultation  Pulmonary exam normal       Cardiovascular negative cardio ROS  Rhythm:Regular Rate:Normal     Neuro/Psych PSYCHIATRIC DISORDERS negative neurological ROS     GI/Hepatic Neg liver ROS, GERD-  Medicated and Controlled,  Endo/Other  negative endocrine ROS  Renal/GU Renal diseasenegative Renal ROS  negative genitourinary   Musculoskeletal   Abdominal   Peds  Hematology negative hematology ROS (+)   Anesthesia Other Findings   Reproductive/Obstetrics negative OB ROS                          Anesthesia Physical Anesthesia Plan  ASA: II  Anesthesia Plan: General   Post-op Pain Management:    Induction: Intravenous  Airway Management Planned: Oral ETT and Video Laryngoscope Planned  Additional Equipment:   Intra-op Plan:   Post-operative Plan: Extubation in OR  Informed Consent: I have reviewed the patients History and Physical, chart, labs and discussed the procedure including the risks, benefits and alternatives for the proposed anesthesia with the patient or authorized representative who has indicated his/her understanding and acceptance.   Dental advisory given  Plan Discussed with: CRNA  Anesthesia Plan Comments:        Anesthesia Quick Evaluation

## 2011-10-24 NOTE — Transfer of Care (Signed)
Immediate Anesthesia Transfer of Care Note  Patient: Kenneth [name]  Procedure(s) Performed: Procedure(s) (LRB) with comments: ANTERIOR CERVICAL DECOMPRESSION/DISCECTOMY FUSION 2 LEVELS (N/A) - ACDF C3-5 C-ARM SKYTRON TABLE SYNTHES VECTOR TITAN CAGE NEURO MONITORING SYTEM  Patient Location: PACU  Anesthesia Type: General  Level of Consciousness: awake and alert   Airway & Oxygen Therapy: Patient Spontanous Breathing and Patient connected to nasal cannula oxygen  Post-op Assessment: Report given to PACU RN and Post -op Vital signs reviewed and stable  Post vital signs: Reviewed and stable  Complications: No apparent anesthesia complications

## 2011-10-24 NOTE — Preoperative (Signed)
Beta Blockers   Reason not to administer Beta Blockers:Not Applicable 

## 2011-10-24 NOTE — H&P (Signed)
No change in clinical exam H+P reviewed  

## 2011-10-24 NOTE — Brief Op Note (Signed)
10/24/2011  4:57 PM  PATIENT:  Kenneth [name]  56 y.o. male  PRE-OPERATIVE DIAGNOSIS:  CERVICAL SPONDYLOTIC MYELOPATHY  POST-OPERATIVE DIAGNOSIS:  CERVICAL SPONDYLOTIC MYELOPATHY  PROCEDURE:  Procedure(s) (LRB) with comments: ANTERIOR CERVICAL DECOMPRESSION/DISCECTOMY FUSION 2 LEVELS (N/A) - ACDF C3-5 C-ARM SKYTRON TABLE SYNTHES VECTOR TITAN CAGE NEURO MONITORING SYTEM  SURGEON:  Surgeon(s) and Role:    * Venita Lick, MD - Primary  PHYSICIAN ASSISTANT:   ASSISTANTS: Norval Gable    ANESTHESIA:   general  EBL:  Total I/O In: 1800 [I.V.:1800] Out: 630 [Urine:555; Blood:75]  BLOOD ADMINISTERED:none  DRAINS: none   LOCAL MEDICATIONS USED:  MARCAINE     SPECIMEN:  No Specimen  DISPOSITION OF SPECIMEN:  N/A  COUNTS:  YES  TOURNIQUET:  * No tourniquets in log *  DICTATION: .Other Dictation: Dictation Number [id number]  PLAN OF CARE: Admit to inpatient   PATIENT DISPOSITION:  PACU - hemodynamically stable.

## 2011-10-24 NOTE — Anesthesia Procedure Notes (Signed)
Date/Time: 10/24/2011 12:14 PM Performed by: Gwenyth Allegra Pre-anesthesia Checklist: Patient identified, Timeout performed, Emergency Drugs available, Suction available and Patient being monitored Patient Re-evaluated:Patient Re-evaluated prior to inductionOxygen Delivery Method: Circle system utilized Preoxygenation: Pre-oxygenation with 100% oxygen Intubation Type: IV induction Ventilation: Mask ventilation without difficulty Laryngoscope Size: Mac and 4 Grade View: Grade II Tube type: Oral Tube size: 8.0 mm Number of attempts: 1 Airway Equipment and Method: Stylet Placement Confirmation: ETT inserted through vocal cords under direct vision,  breath sounds checked- equal and bilateral and positive ETCO2 Secured at: 22 cm Tube secured with: Tape Dental Injury: Teeth and Oropharynx as per pre-operative assessment

## 2011-10-25 ENCOUNTER — Encounter (HOSPITAL_COMMUNITY): Payer: Self-pay | Admitting: Orthopedic Surgery

## 2011-10-25 MED ORDER — ONDANSETRON HCL 4 MG PO TABS: 4.0000 mg | ORAL_TABLET | Freq: Three times a day (TID) | ORAL | Status: DC | PRN

## 2011-10-25 MED ORDER — DIAZEPAM 5 MG PO TABS
5.0000 mg | ORAL_TABLET | Freq: Three times a day (TID) | ORAL | Status: DC | PRN
  Administered 2011-10-25: 5 mg via ORAL
  Filled 2011-10-25: qty 1

## 2011-10-25 MED ORDER — POLYETHYLENE GLYCOL 3350 17 G PO PACK: 17.0000 g | PACK | Freq: Every day | ORAL | Status: DC

## 2011-10-25 MED ORDER — DIAZEPAM 5 MG PO TABS: 5.0000 mg | ORAL_TABLET | Freq: Three times a day (TID) | ORAL | Status: DC | PRN

## 2011-10-25 MED ORDER — GUAIFENESIN-DM 100-10 MG/5ML PO SYRP
5.0000 mL | ORAL_SOLUTION | ORAL | Status: DC | PRN
  Filled 2011-10-25: qty 5

## 2011-10-25 NOTE — Progress Notes (Signed)
I agree with the following treatment note after reviewing documentation.   Johnston, Elpidia Karn Brynn   OTR/L Pager: [phone] Office: [phone] .   

## 2011-10-25 NOTE — Progress Notes (Signed)
Occupational Therapy Evaluation Patient Details Name: Kenneth [name] MRN: [id number] DOB: 1955/08/16 Today's Date: 10/25/2011 Time: 4696-2952 OT Time Calculation (min): 17 min  OT Assessment / Plan / Recommendation Clinical Impression  Pt. 56 yo male s/p ACDF. Pt is doing very well, up and ambulating in room upon arrival. Pt. and wife educated on precautions and how to properly care for cervical brace. Pt. at safe level to d/c home. No acute OT needs     OT Assessment  Patient does not need any further OT services    Follow Up Recommendations  No OT follow up    Barriers to Discharge      Equipment Recommendations  None recommended by OT    Recommendations for Other Services    Frequency       Precautions / Restrictions Precautions Precautions: Cervical Required Braces or Orthoses: Cervical Brace Restrictions Weight Bearing Restrictions: No   Pertinent Vitals/Pain None reported     ADL  Grooming: Performed;Wash/dry hands;Teeth care;Modified independent Where Assessed - Grooming: Unsupported standing Toilet Transfer: Simulated;Independent Toilet Transfer Method: Sit to Barista: Regular height toilet Tub/Shower Transfer: Simulated;Independent Tub/Shower Transfer Method: Science writer: Walk in shower Transfers/Ambulation Related to ADLs: Pt. is indepndent for transfer and ambulation. Pt. was up ambulating in room upon arrival ADL Comments: Pt. already dressed before OT session. Discussed dressing techniques in order to maintain cervical precautions. Pt. able to don and doff brace independently. Educated pt. and wife how to clean pads in brace and gave cervical precautions handout. Pt. is modified independent for grooming tasks at sink level.    OT Diagnosis:    OT Problem List:   OT Treatment Interventions:     OT Goals    Visit Information  Last OT Received On: 10/25/11 Assistance Needed: +1 PT/OT  Co-Evaluation/Treatment: Yes    Subjective Data  Subjective: I am doing pretty good today Patient Stated Goal: To go home today    Prior Functioning     Home Living Lives With: Spouse Available Help at Discharge: Family Type of Home: House Home Access: Stairs to enter [employer] of Steps: 3 Entrance Stairs-Rails: Right;Left Home Layout: Two level;Able to live on main level with bedroom/bathroom Alternate Level Stairs-Number of Steps: 12 Alternate Level Stairs-Rails: Right Bathroom Shower/Tub: Walk-in shower;Door Dentist: None Prior Function Level of Independence: Independent Able to Take Stairs?: Yes Driving: Yes Vocation: Part time employment Communication Communication: No difficulties         Vision/Perception     Cognition  Overall Cognitive Status: Appears within functional limits for tasks assessed/performed Arousal/Alertness: Awake/alert Orientation Level: Appears intact for tasks assessed;Oriented X4 / Intact Behavior During Session: [facility] for tasks performed    Extremity/Trunk Assessment Right Upper Extremity Assessment RUE ROM/Strength/Tone: Within functional levels Left Upper Extremity Assessment LUE ROM/Strength/Tone: Deficits LUE ROM/Strength/Tone Deficits: Decreased strength noted, pt stated this was baseline for him due to previous injury to UE.  Right Lower Extremity Assessment RLE ROM/Strength/Tone: Within functional levels Left Lower Extremity Assessment LLE ROM/Strength/Tone: Within functional levels Trunk Assessment Trunk Assessment: Normal     Mobility Bed Mobility Bed Mobility: Not assessed Transfers Transfers: Sit to Stand;Stand to Sit Sit to Stand: 7: Independent;From chair/3-in-1;With upper extremity assist;With armrests Stand to Sit: 7: Independent;With upper extremity assist;With armrests;To chair/3-in-1     Shoulder Instructions     Exercise     Balance     End of  Session OT - End of Session Equipment  Utilized During Treatment: Cervical collar Activity Tolerance: Patient tolerated treatment well Patient left: in chair;with call bell/phone within reach;with family/visitor present Nurse Communication: Mobility status  GO     [name] 10/25/2011, 2:02 PM

## 2011-10-25 NOTE — Progress Notes (Signed)
Referral received for SNF. Chart reviewed and CSW has spoken with RNCM who indicates that patient is for DC to home with no need identified.  D/c home today.  CSW to sign off.  Lorri Frederick. [place]  [phone]

## 2011-10-25 NOTE — Discharge Summary (Signed)
Patient ID: Kenneth [name] MRN: [id number] DOB/AGE: 07/13/1955 56 y.o.  Admit date: 10/24/2011 Discharge date: 10/25/2011   Admission Diagnoses:  Active Problems:  Cervical spondylosis with myelopathy   Discharge Diagnoses:  Active Problems:  Cervical spondylosis with myelopathy  status post Procedure(s): ANTERIOR CERVICAL DECOMPRESSION/DISCECTOMY FUSION 2 LEVELS  Past Medical History  Diagnosis Date  . PONV (postoperative nausea and vomiting)   . Anxiety   . Collapsed lung 2010  . Neuropathy of hand     left  . Neuromuscular disorder   . Reflex sympathetic dystrophy of the arm     left arm  . GERD (gastroesophageal reflux disease)   . 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 Chronic pain disorder     left arm, shoulder and chest ;thoracic syndrome  . History of thoracic outlet syndrome 2008    rib removed in 2009  . Carotid artery occlusion     right  . Depression   . Insomnia due to medical condition   . Snoring   . Pneumonia 2010  . History of blood transfusion 2010  . Fibromyalgia     "left shoulder area" (10/24/2011)  . RSD (reflex sympathetic dystrophy)     "left shoulder and left arm" (10/24/2011)    Surgeries: Procedure(s): ANTERIOR CERVICAL DECOMPRESSION/DISCECTOMY FUSION 2 LEVELS on 10/24/2011   Consultants: none  Discharged Condition: Improved  Hospital Course: Kenneth [name] is an 56 y.o. male who was admitted 10/24/2011 for operative treatment of cervical spondylosis with myelopathy. Patient failed conservative treatments (please see the history and physical for the specifics) and had severe unremitting pain that affects sleep, daily activities and work/hobbies. After pre-op clearance, the patient was taken to the operating room on 10/24/2011 and underwent  Procedure(s): ANTERIOR CERVICAL DECOMPRESSION/DISCECTOMY FUSION 2 LEVELS.    Patient was given perioperative antibiotics: Anti-infectives     Start     Dose/Rate Route Frequency Ordered Stop   10/24/11 2100   ceFAZolin (ANCEF) IVPB 1 g/50 mL premix        1 g 100 mL/hr over 30 Minutes Intravenous Every 8 hours 10/24/11 2057 10/25/11 0629   10/23/11 1242   ceFAZolin (ANCEF) IVPB 2 g/50 mL premix        2 g 100 mL/hr over 30 Minutes Intravenous 60 min pre-op 10/23/11 1242 10/24/11 1215           Patient was given sequential compression devices and early ambulation to prevent DVT.   Patient benefited maximally from hospital stay and there were no complications. At the time of discharge, the patient was urinating/moving their bowels without difficulty, tolerating a regular diet, pain is controlled with oral pain medications and they have been cleared by PT/OT.   Recent vital signs: Patient Vitals for the past 24 hrs:  BP Temp Temp src Pulse Resp SpO2  10/25/11 0550 161/73 mmHg 97.8 F (36.6 C) - 85  16  95 %  10/24/11 2042 164/84 mmHg 98.1 F (36.7 C) - 73  16  95 %  10/24/11 1945 154/77 mmHg 98.5 F (36.9 C) - 85  16  99 %  10/24/11 1930 159/92 mmHg - - 87  13  99 %  10/24/11 1915 165/87 mmHg - - 84  13  98 %  10/24/11 1900 159/83 mmHg - - 84  9  98 %  10/24/11 1845 160/92 mmHg - - 86  11  99 %  10/24/11 1830 153/81 mmHg - - 88  14  97 %  10/24/11 1815 153/85 mmHg - - 87  11  98 %  10/24/11 1800 151/77 mmHg - - 92  12  98 %  10/24/11 1745 153/78 mmHg - - 100  17  98 %  10/24/11 1740 153/78 mmHg 98.5 F (36.9 C) - 99  13  98 %  10/24/11 0945 144/84 mmHg 97.6 F (36.4 C) Oral 71  18  99 %     Recent laboratory studies: No results found for this basename: WBC:2,HGB:2,HCT:2,PLT:2,NA:2,K:2,CL:2,CO2:2,BUN:2,CREATININE:2,GLUCOSE:2,PT:2,INR:2,CALCIUM,2: in the last 72 hours   Discharge Medications:     Medication List     As of 10/25/2011  8:03 AM    STOP taking these medications         ALPRAZolam 0.5 MG tablet   Commonly known as: XANAX      naproxen sodium 220 MG tablet   Commonly known as: ANAPROX      TAKE these medications         aspirin EC 81 MG  tablet   Take 81 mg by mouth daily.      cetirizine 10 MG tablet   Commonly known as: ZYRTEC   Take 10 mg by mouth daily.      Cinnamon 500 MG capsule   Take 1,000 mg by mouth daily.      diazepam 5 MG tablet   Commonly known as: VALIUM   Take 1 tablet (5 mg total) by mouth every 8 (eight) hours as needed for anxiety (and spasm).      fentaNYL 25 MCG/HR   Commonly known as: DURAGESIC - dosed mcg/hr   Place 1 patch onto the skin every other day.      KRILL OIL PO   Take 1 capsule by mouth daily.      l-methylfolate-B6-B12 3-35-2 MG Tabs   Commonly known as: METANX   Take 1 tablet by mouth daily.      mirtazapine 15 MG tablet   Commonly known as: REMERON   Take 15 mg by mouth at bedtime.      mupirocin ointment 2 %   Commonly known as: BACTROBAN   Place 1 application into the nose 2 (two) times daily. For positive PCR; Finish AM 10/26/11      ondansetron 4 MG tablet   Commonly known as: ZOFRAN   Take 1 tablet (4 mg total) by mouth every 8 (eight) hours as needed for nausea. MAX 3 pills daily      oxycodone 5 MG capsule   Commonly known as: OXY-IR   Take 5 mg by mouth every 4 (four) hours as needed. For pain      polyethylene glycol packet   Commonly known as: MIRALAX / GLYCOLAX   Take 17 g by mouth daily. Take 1 packet daily until bowels become regular      pregabalin 75 MG capsule   Commonly known as: LYRICA   Take 225 mg by mouth 2 (two) times daily.      RABEprazole 20 MG tablet   Commonly known as: ACIPHEX   Take 20 mg by mouth daily.      zolpidem 10 MG tablet   Commonly known as: AMBIEN   Take 5-10 mg by mouth at bedtime as needed. For sleep        Diagnostic Studies:  Chest 2 View  10/21/2011  *RADIOLOGY REPORT*  Clinical Data: Preop for anterior cervical decompression.  Ex- smoker.  CHEST - 2 VIEW  Comparison: 12/13/2008  Findings: Surgical  changes to the eighth posterolateral right rib.  Midline trachea.  Normal heart size and mediastinal contours.  Minimal pleural thickening blunts the right costophrenic angle on the frontal. No pneumothorax.  Clear lungs.  IMPRESSION: No acute cardiopulmonary disease.   Original Report Authenticated By: Consuello Bossier, M.D.    Dg Cervical Spine 2-3 Views  10/24/2011  *RADIOLOGY REPORT*  Clinical Data: Postop cervical fusion.  CERVICAL SPINE - 2-3 VIEW  Comparison: Intraoperative views same date.  Findings: Patient is status post C3-C5 ACDF.  The hardware appears unchanged in position.  The alignment is normal.  IMPRESSION: Intact hardware and stable alignment status post C3-C5 ACDF.  No demonstrated complication.   Original Report Authenticated By: Gerrianne Scale, M.D.    Dg Cervical Spine 2-3 Views  10/24/2011  *RADIOLOGY REPORT*  Clinical Data: C3-C4 and C4-C5 ACDF.  DG C-ARM 1-60 MIN,CERVICAL SPINE - 2-3 VIEW  Comparison:  Cervical spine radiographs 10/21/2011.  Findings: A single lateral fluoroscopic image demonstrates interval anterior discectomy and fusion from C3-C5 with an anterior plate, screws and intervertebral prosthetic discs.  Hardware appears well positioned.  Alignment is normal.  IMPRESSION: No demonstrated complication following C3-C5 ACDF.   Original Report Authenticated By: Gerrianne Scale, M.D.    Dg Cervical Spine 2-3 Views  10/21/2011  *RADIOLOGY REPORT*  Clinical Data: Anterior cervical decompression/discectomy.  CERVICAL SPINE - 2-3 VIEW  Comparison: MRI of 08/13/2006  Findings: AP and lateral views.  The lateral view images through the mid the lower C7 level. Prevertebral soft tissues are within normal limits.  Degenerative disc disease which is most marked at C3-C4.  Mild straightening of expected cervical lordosis.  Hypoplastic left first rib.  IMPRESSION: Spondylosis, primarily at C3-C4.  Spinal numbering performed.   Original Report Authenticated By: Consuello Bossier, M.D.    Dg C-arm 1-60 Min  10/24/2011  *RADIOLOGY REPORT*  Clinical Data: C3-C4 and C4-C5 ACDF.  DG C-ARM 1-60  MIN,CERVICAL SPINE - 2-3 VIEW  Comparison:  Cervical spine radiographs 10/21/2011.  Findings: A single lateral fluoroscopic image demonstrates interval anterior discectomy and fusion from C3-C5 with an anterior plate, screws and intervertebral prosthetic discs.  Hardware appears well positioned.  Alignment is normal.  IMPRESSION: No demonstrated complication following C3-C5 ACDF.   Original Report Authenticated By: Gerrianne Scale, M.D.         Discharge Orders    Future Orders Please Complete By Expires   Diet - low sodium heart healthy      Call MD / Call 911      Comments:   If you experience chest pain or shortness of breath, CALL 911 and be transported to the hospital emergency room.  If you develope a fever above 101 F, pus (white drainage) or increased drainage or redness at the wound, or calf pain, call your surgeon's office.   Constipation Prevention      Comments:   Drink plenty of fluids.  Prune juice may be helpful.  You may use a stool softener, such as Colace (over the counter) 100 mg twice a day.  Use MiraLax (over the counter) for constipation as needed.   Increase activity slowly as tolerated      Discharge instructions      Comments:   Keep incision clean and dry.  Leave steri strips in place.  May shower 5 days from surgery; pat to dry following shower.  May redress with clean, dry dressing if you would like.  Do not apply any lotion/cream/ointment  to the incision.   Driving restrictions      Comments:   No driving for 2 weeks.  Dr Shon Baton will discuss addition driving restrictions at your first post-op visit in 2 weeks.   Lifting restrictions      Comments:   No lifting anything greater than 5 pounds.  DO NOT reach overhead (above shoulder height).  NO bending, stooping or squatting.  Dr. Shon Baton will discuss additional lifting restrictions at your first post-op visit in 2 weeks.      Follow-up Information    Follow up with Alvy Beal, MD. In 2 weeks.   Contact  information:   [facility] [address] [name] [address] [phone]          Discharge Plan:  discharge to home   Disposition: stable at the time of discharge    Signed: Gwinda Maine for Dr. Venita Lick [facility] [phone] 10/25/2011, 8:03 AM

## 2011-10-25 NOTE — Op Note (Signed)
NAMEAQEEL, NORGAARD NO.:  [id number]  MEDICAL RECORD NO.:  [id number]  LOCATION:  5N21C                        FACILITY:  [facility]  PHYSICIAN:  Alvy Beal, MD    DATE OF BIRTH:  [birth year]  DATE OF PROCEDURE:  10/24/2011 DATE OF DISCHARGE:                              OPERATIVE REPORT   PREOPERATIVE DIAGNOSIS:  Cervical spondylotic myelopathy.  POSTOPERATIVE DIAGNOSIS:  Cervical spondylotic myelopathy.  OPERATIVE PROCEDURE:  Anterior cervical diskectomy and fusion, C3-4 C4- 5.  COMPLICATIONS:  None.  CONDITION:  Stable.  FIRST ASSISTANT:  Norval Gable, [place]  INSTRUMENTATION SYSTEM USED:  Titan titanium intervertebral cages 7 medium lordotic at 3, 4, 8 medium lordotic at 4, 5 with a 36 mm anterior cervical Synthes Vectra plate affixed with appropriate size screws.  HISTORY:  This is a very pleasant gentleman who presents to my office with chronic long-standing progressive neck pain, arm pain, and evidence of myelopathy.  As a result of severe progression in his pain, we elected to treat this surgically.  All appropriate risks, benefits, and alternatives were discussed with the patient.  Consent was obtained.  OPERATIVE NOTE:  The patient was brought to the operating room, placed supine on the operating table.  After successful induction of general anesthesia, endotracheal intubation, TEDs, SCDs, Foley were inserted. Wrist restraints were placed to apply inline traction for x-rays during the surgery.  The neck was prepped and draped in a standard fashion. Time-out was then done to confirm patient, procedure, and all other pertinent important data.  Once this was done, longitudinal right-sided incision was made along the lateral medial border of the sternocleidomastoid.  This started at about the level of the 3-4 disk and proceeded down to the level of the 4-5 disk.  Sharp dissection was carried out down to and through the platysma.  I then sharply  dissected along the medial border of sternocleidomastoid sweeping the esophagus medially and palpating and identifying and protecting the carotid sheath laterally.  I continued my dissection down until I was through the deep cervical and prevertebral fascia.  There were some traversing venous structures, which were tied off and cut.  At this point, I had adequate visualization of the anterior cervical spine.  I then placed a needle into the 4-5 disk and confirmed that I was at the appropriate level using lateral fluoroscopy.  Once this was done, I then mobilized the longus colli muscles from the midbody of C3 to the midbody of C5.  I then placed the retractor blades underneath the longus colli muscle, deflated endotracheal cuff, and expanded the retractors to the appropriate width.  I then placed distraction pins into the bodies of C3 and C4 and then distracted the disk space.  I then removed the overhanging osteophyte, and then incised the disk space with a 15 blade scalpel.  After the annulotomy was performed, I then used sequential pituitary rongeurs, curettes, and Kerrison rongeurs to remove all the disk material.  There was significant osteophyte formation along the posterior C3 endplate.  Using a high-speed bur, and micro nerve hooks, I thinned down this osteophyte and then developed a plane underneath it using a  1 mm Kerrison, I was able to resect this.  With this bone spur resected, I was able to easily get underneath the posterior longitudinal ligament with the nerve hook and then I used a 1-mm Kerrison to resect it.  I now had adequate central decompression.  I was able to get underneath the uncovertebral joints and resect and decompress there. This was the level that the myelomalacia was at.  At this point, I was very pleased with the decompression, I had adequately decompressed the space and removed all of the bone spurs.  I then rasped the endplates to confirm that I had  bleeding subchondral bone.  Once this was done, I then measured with trial devices and placed a 7 medium lordotic cage packed with DBX mix into the intervertebral space.  I had good fixation. The distraction pin at C4 was freed and removed and bone wax was placed into the hole and placed into the body of C5.  I repositioned my retractors and using the same technique, I removed all the disk material at the 4-5 level.  There was a disk fragment in the posterolateral left corner, which was removed.  I then used a fine nerve hook to develop a plane underneath the posterior longitudinal ligament and used a 1 mm Kerrison to resect it.  At this point, I had completely removed the posterior longitudinal ligament, decompressed under the uncovertebral joints, removed the disk fragment.  I then rasped the endplates and placed 8 medium lordotic Titan cage packed with DBX mix.  I then removed the distraction pins and retractors and contoured a 36 mm anterior cervical plate and affixed it to the bodies of C3 with 16 mm screws and 14 mm variable screws into the body of C5.  Another set of 14 mm screws were used to the body of C4.  All screws were properly torqued down and engaged the locking mechanism on the plate.  I then checked to ensure the esophagus was not entrapped beneath the plate and it was not. I irrigated copiously normal saline, used bipolar electrocautery to obtain hemostasis and then returned the trachea and esophagus to midline position.  I then closed the platysma with interrupted 2-0 Vicryl sutures, and 3-0 Monocryl for the skin.  Steri-Strips and dry dressing were applied.  The patient was ultimately extubated and transferred to the PACU without incident.  At the end of the case, all needle and sponge counts were correct.  It should be noted that I did use intraoperative neuromonitoring because of the myelopathy and there was no adverse intraoperative events with evoked motor or  sensory potentials.  The nurse and representative did place all of the appropriate needles prior to and after induction.     Alvy Beal, MD     DDB/MEDQ  D:  10/24/2011  T:  10/25/2011  Job:  [id number]

## 2011-10-25 NOTE — Evaluation (Signed)
Physical Therapy Evaluation Patient Details Name: Kenneth [name] MRN: [id number] DOB: 1955/02/18 Today's Date: 10/25/2011 Time: 9528-4132 PT Time Calculation (min): 19 min  PT Assessment / Plan / Recommendation Clinical Impression  Pt is 56 y.o. male s/p cervical sx is independent with ambulation and has no further PT needs at this time.    PT Assessment  Patent does not need any further PT services             Equipment Recommendations  None recommended by OT          Precautions / Restrictions Precautions Precautions: Cervical Required Braces or Orthoses: Cervical Brace Restrictions Weight Bearing Restrictions: No   Pertinent Vitals/Pain No pain reported at this time      Mobility  Bed Mobility Bed Mobility: Not assessed Transfers Sit to Stand: 7: Independent;From chair/3-in-1;With upper extremity assist;With armrests Stand to Sit: 7: Independent;With upper extremity assist;With armrests;To chair/3-in-1 Ambulation/Gait Ambulation/Gait Assistance: 7: Independent Ambulation Distance (Feet): 100 Feet Assistive device: None Ambulation/Gait Assistance Details: no assist or VC's required      PT Goals    Visit Information  Last PT Received On: 10/25/11 Assistance Needed: +1    Subjective Data  Subjective: I don't think I'm gonna need that  Patient Stated Goal: to go home   Prior Functioning  Home Living Lives With: Spouse Available Help at Discharge: Family Type of Home: House Home Access: Stairs to enter Secretary/administrator of Steps: 3 Entrance Stairs-Rails: Right;Left Home Layout: Two level;Able to live on main level with bedroom/bathroom Alternate Level Stairs-Number of Steps: 12 Alternate Level Stairs-Rails: Right Bathroom Shower/Tub: Walk-in shower;Door Dentist: None Prior Function Level of Independence: Independent Able to Take Stairs?: Yes Driving: Yes Vocation: Part time  employment Communication Communication: No difficulties    Cognition  Overall Cognitive Status: Appears within functional limits for tasks assessed/performed Arousal/Alertness: Awake/alert Orientation Level: Appears intact for tasks assessed;Oriented X4 / Intact Behavior During Session: [facility] for tasks performed    Extremity/Trunk Assessment Right Upper Extremity Assessment RUE ROM/Strength/Tone: Within functional levels Left Upper Extremity Assessment LUE ROM/Strength/Tone: Deficits LUE ROM/Strength/Tone Deficits: Decreased strength noted, pt stated this was baseline for him due to previous injury to UE.  Right Lower Extremity Assessment RLE ROM/Strength/Tone: Within functional levels Left Lower Extremity Assessment LLE ROM/Strength/Tone: Within functional levels Trunk Assessment Trunk Assessment: Normal   Balance    End of Session PT - End of Session Activity Tolerance: Patient tolerated treatment well Patient left: in chair;with call bell/phone within reach;with family/visitor present Nurse Communication: Mobility status  GP     Fabio Asa 10/25/2011, 3:53 PM  Charlotte Crumb, PT DPT  [phone]

## 2011-10-25 NOTE — Progress Notes (Signed)
CARE MANAGEMENT NOTE 10/25/2011  Patient:  ISAEL, STILLE   Account Number:  [id number]  Date Initiated:  10/25/2011  Documentation initiated by:  Vance Peper  Subjective/Objective Assessment:   56 yr old male s/p anterior cervical diskectomy and fusion C3-4,4-5.     Action/Plan:   No home health needs identified.   Anticipated DC Date:  10/25/2011   Anticipated DC Plan:  HOME/SELF CARE      DC Planning Services  CM consult      Choice offered to / List presented to:             Status of service:  Completed, signed off Medicare Important Message given?   (If response is "NO", the following Medicare IM given date fields will be blank) Date Medicare IM given:   Date Additional Medicare IM given:    Discharge Disposition:  HOME/SELF CARE  Per UR Regulation:    If discussed at Long Length of Stay Meetings, dates discussed:    Comments:

## 2011-10-25 NOTE — Progress Notes (Signed)
I agree with the following treatment note after reviewing documentation.   Johnston, Sayge Brienza Brynn   OTR/L Pager: [phone] Office: [phone] .   

## 2011-10-25 NOTE — Progress Notes (Signed)
Occupational Therapy Discharge Patient Details Name: Kenneth [name] MRN: [id number] DOB: Oct 29, 1955 Today's Date: 10/25/2011 Time: 7829-5621 OT Time Calculation (min): 17 min  Patient discharged from OT services secondary to Pt. and wife educated on cervical precautions. Pt. is modified independent to complete ADL tasks. Pt able to don and doff brace, transfer and ambulate independently. Pt. safe to d/c home at this level .  Please see latest therapy progress note for current level of functioning and progress toward goals.    Progress and discharge plan discussed with patient and/or caregiver: Patient/Caregiver agrees with plan  GO     [name] 10/25/2011, 2:02 PM

## 2011-10-25 NOTE — Progress Notes (Signed)
    Subjective: Procedure(s) (LRB): ANTERIOR CERVICAL DECOMPRESSION/DISCECTOMY FUSION 2 LEVELS (N/A) 1 Day Post-Op  Patient reports pain as 4 on 0-10 scale.  Reports decreased arm pain reports incisional neck pain   Positive void Negative bowel movement Positive flatus Negative chest pain or shortness of breath  Objective: Vital signs in last 24 hours: Temp:  [97.6 F (36.4 C)-98.5 F (36.9 C)] 97.8 F (36.6 C) (10/11 0550) Pulse Rate:  [71-100] 85  (10/11 0550) Resp:  [9-18] 16  (10/11 0550) BP: (144-165)/(73-92) 161/73 mmHg (10/11 0550) SpO2:  [95 %-99 %] 95 % (10/11 0550)  Intake/Output from previous day: 10/10 0701 - 10/11 0700 In: 3265 [I.V.:2865; IV Piggyback:400] Out: 3430 [Urine:3355; Blood:75]  Labs: No results found for this basename: WBC:2,RBC:2,HCT:2,PLT:2 in the last 72 hours No results found for this basename: NA:2,K:2,CL:2,CO2:2,BUN:2,CREATININE:2,GLUCOSE:2,CALCIUM:2 in the last 72 hours No results found for this basename: LABPT:2,INR:2 in the last 72 hours  Physical Exam: Neurologically intact Neurovascular intact Intact pulses distally Incision: dressing C/D/I and no drainage Compartment soft  Assessment/Plan: Patient stable  xrays satisfactory Mobilization with physical therapy Encourage incentive spirometry Continue care  Advance diet Up with therapy consider d/c today if valium improves muscle spasms  Also add cough suppressant to decrease pain when coughing  Venita Lick, MD [facility] [phone]

## 2011-10-25 NOTE — Progress Notes (Signed)
Utilization review completed. Nile Prisk, RN, BSN. 

## 2011-10-28 NOTE — Progress Notes (Signed)
10/25/11 1350  OT Visit Information  Last OT Received On 10/25/11  Assistance Needed +1  PT/OT Co-Evaluation/Treatment Yes  OT Time Calculation  OT Start Time 1334  OT Stop Time 1351  OT Time Calculation (min) 17 min  Subjective Data  Subjective I am doing pretty good today  Patient Stated Goal To go home today   Precautions  Precautions Cervical  Required Braces or Orthoses Cervical Brace  Restrictions  Weight Bearing Restrictions No  Home Living  Lives With Spouse  Available Help at Discharge Family  Type of Home House  Home Access Stairs to enter  Entrance Stairs-Number of Steps 3  Entrance Stairs-Rails Right;Left  Home Layout Two level;Able to live on main level with bedroom/bathroom  Alternate Level Stairs-Number of Steps 12  Alternate Level Stairs-Rails Right  Bathroom Shower/Tub Walk-in shower;Door  Data processing manager None  Prior Function  Level of Independence Independent  Able to Take Stairs? Yes  Driving Yes  Vocation Part time employment  Communication  Communication No difficulties  ADL  Grooming Performed;Wash/dry hands;Teeth care;Modified independent  Where Assessed - Grooming Unsupported standing  Toilet Transfer Simulated;Independent  Statistician Method Sit to Visual merchandiser;Independent  Personnel officer Walk in shower  Transfers/Ambulation Related to ADLs Pt. is indepndent for transfer and ambulation. Pt. was up ambulating in room upon arrival  ADL Comments Pt. already dressed before OT session. Discussed dressing techniques in order to maintain cervical precautions. Pt. able to don and doff brace independently. Educated pt. and wife how to clean pads in brace and gave cervical precautions handout. Pt. is modified independent for grooming tasks at sink level.  Cognition  Overall Cognitive Status  Appears within functional limits for tasks assessed/performed  Arousal/Alertness Awake/alert  Orientation Level Appears intact for tasks assessed;Oriented X4 / Intact  Behavior During Session [facility] for tasks performed  Right Upper Extremity Assessment  RUE ROM/Strength/Tone WFL  Left Upper Extremity Assessment  LUE ROM/Strength/Tone Deficits  LUE ROM/Strength/Tone Deficits Decreased strength noted, pt stated this was baseline for him due to previous injury to UE.   Right Lower Extremity Assessment  RLE ROM/Strength/Tone WFL  Left Lower Extremity Assessment  LLE ROM/Strength/Tone WFL  Trunk Assessment  Trunk Assessment Normal  Bed Mobility  Bed Mobility Not assessed  Transfers  Transfers Sit to Stand;Stand to Sit  Sit to Stand 7: Independent;From chair/3-in-1;With upper extremity assist;With armrests  Stand to Sit 7: Independent;With upper extremity assist;With armrests;To chair/3-in-1  OT - End of Session  Equipment Utilized During Treatment Cervical collar  Activity Tolerance Patient tolerated treatment well  Patient left in chair;with call bell/phone within reach;with family/visitor present  Nurse Communication Mobility status  OT Assessment  Clinical Impression Statement Pt. 56 yo male s/p ACDF. Pt is doing very well, up and ambulating in room upon arrival. Pt. and wife educated on precautions and how to properly care for cervical brace. Pt. at safe level to d/c home. No acute OT needs   OT Recommendation/Assessment Patient does not need any further OT services  OT Recommendation  Follow Up Recommendations No OT follow up  Equipment Recommended None recommended by OT  OT G-codes **NOT FOR INPATIENT CLASS**  Functional Assessment Tool Used ch  Functional Limitation Self care  Changing and Maintaining Body Position Current Status (Z6109) [facility]  Changing and Maintaining Body Position Goal Status (U0454) [facility]  Changing and Maintaining Body Position Discharge  Status [phone]) [facility]  Self Care  Current Status [phone]) [facility]  Self Care Goal Status [phone]) [facility]  Self Care Discharge Status [phone]) [facility]    Addendum note to add G codes   Lucile Shutters   OTR/L Pager: [phone] Office: [phone] .

## 2011-11-22 DIAGNOSIS — Z23 Encounter for immunization: Secondary | ICD-10-CM | POA: Diagnosis not present

## 2012-10-13 DIAGNOSIS — M79609 Pain in unspecified limb: Secondary | ICD-10-CM | POA: Diagnosis not present

## 2012-10-13 DIAGNOSIS — Z79899 Other long term (current) drug therapy: Secondary | ICD-10-CM | POA: Diagnosis not present

## 2012-10-13 DIAGNOSIS — G548 Other nerve root and plexus disorders: Secondary | ICD-10-CM | POA: Diagnosis not present

## 2012-10-13 DIAGNOSIS — G894 Chronic pain syndrome: Secondary | ICD-10-CM | POA: Diagnosis not present

## 2013-01-04 DIAGNOSIS — M25519 Pain in unspecified shoulder: Secondary | ICD-10-CM | POA: Diagnosis not present

## 2013-01-04 DIAGNOSIS — M67919 Unspecified disorder of synovium and tendon, unspecified shoulder: Secondary | ICD-10-CM | POA: Diagnosis not present

## 2013-01-19 DIAGNOSIS — M25519 Pain in unspecified shoulder: Secondary | ICD-10-CM | POA: Diagnosis not present

## 2013-01-19 DIAGNOSIS — M67919 Unspecified disorder of synovium and tendon, unspecified shoulder: Secondary | ICD-10-CM | POA: Diagnosis not present

## 2013-01-21 DIAGNOSIS — M25519 Pain in unspecified shoulder: Secondary | ICD-10-CM | POA: Diagnosis not present

## 2013-01-21 DIAGNOSIS — M67919 Unspecified disorder of synovium and tendon, unspecified shoulder: Secondary | ICD-10-CM | POA: Diagnosis not present

## 2013-02-09 DIAGNOSIS — G894 Chronic pain syndrome: Secondary | ICD-10-CM | POA: Diagnosis not present

## 2013-02-09 DIAGNOSIS — M79609 Pain in unspecified limb: Secondary | ICD-10-CM | POA: Diagnosis not present

## 2013-02-09 DIAGNOSIS — M62838 Other muscle spasm: Secondary | ICD-10-CM | POA: Diagnosis not present

## 2013-02-09 DIAGNOSIS — G548 Other nerve root and plexus disorders: Secondary | ICD-10-CM | POA: Diagnosis not present

## 2013-04-12 DIAGNOSIS — M62838 Other muscle spasm: Secondary | ICD-10-CM | POA: Diagnosis not present

## 2013-04-12 DIAGNOSIS — G894 Chronic pain syndrome: Secondary | ICD-10-CM | POA: Diagnosis not present

## 2013-04-12 DIAGNOSIS — G548 Other nerve root and plexus disorders: Secondary | ICD-10-CM | POA: Diagnosis not present

## 2013-04-12 DIAGNOSIS — G4700 Insomnia, unspecified: Secondary | ICD-10-CM | POA: Diagnosis not present

## 2013-05-18 DIAGNOSIS — G894 Chronic pain syndrome: Secondary | ICD-10-CM | POA: Diagnosis not present

## 2013-05-18 DIAGNOSIS — M62838 Other muscle spasm: Secondary | ICD-10-CM | POA: Diagnosis not present

## 2013-05-18 DIAGNOSIS — G4700 Insomnia, unspecified: Secondary | ICD-10-CM | POA: Diagnosis not present

## 2013-05-18 DIAGNOSIS — G548 Other nerve root and plexus disorders: Secondary | ICD-10-CM | POA: Diagnosis not present

## 2013-06-15 DIAGNOSIS — G548 Other nerve root and plexus disorders: Secondary | ICD-10-CM | POA: Diagnosis not present

## 2013-06-15 DIAGNOSIS — G894 Chronic pain syndrome: Secondary | ICD-10-CM | POA: Diagnosis not present

## 2013-06-15 DIAGNOSIS — G4700 Insomnia, unspecified: Secondary | ICD-10-CM | POA: Diagnosis not present

## 2013-06-15 DIAGNOSIS — M62838 Other muscle spasm: Secondary | ICD-10-CM | POA: Diagnosis not present

## 2013-06-22 DIAGNOSIS — G8929 Other chronic pain: Secondary | ICD-10-CM | POA: Diagnosis not present

## 2013-06-22 DIAGNOSIS — F411 Generalized anxiety disorder: Secondary | ICD-10-CM | POA: Diagnosis not present

## 2013-06-22 DIAGNOSIS — K2100 Gastro-esophageal reflux disease with esophagitis, without bleeding: Secondary | ICD-10-CM | POA: Diagnosis not present

## 2013-06-22 DIAGNOSIS — J209 Acute bronchitis, unspecified: Secondary | ICD-10-CM | POA: Diagnosis not present

## 2013-07-13 DIAGNOSIS — G4700 Insomnia, unspecified: Secondary | ICD-10-CM | POA: Diagnosis not present

## 2013-07-13 DIAGNOSIS — G894 Chronic pain syndrome: Secondary | ICD-10-CM | POA: Diagnosis not present

## 2013-07-13 DIAGNOSIS — G548 Other nerve root and plexus disorders: Secondary | ICD-10-CM | POA: Diagnosis not present

## 2013-07-13 DIAGNOSIS — M62838 Other muscle spasm: Secondary | ICD-10-CM | POA: Diagnosis not present

## 2013-08-17 DIAGNOSIS — G548 Other nerve root and plexus disorders: Secondary | ICD-10-CM | POA: Diagnosis not present

## 2013-08-17 DIAGNOSIS — M62838 Other muscle spasm: Secondary | ICD-10-CM | POA: Diagnosis not present

## 2013-08-17 DIAGNOSIS — G894 Chronic pain syndrome: Secondary | ICD-10-CM | POA: Diagnosis not present

## 2013-08-17 DIAGNOSIS — G4700 Insomnia, unspecified: Secondary | ICD-10-CM | POA: Diagnosis not present

## 2013-09-14 DIAGNOSIS — G548 Other nerve root and plexus disorders: Secondary | ICD-10-CM | POA: Diagnosis not present

## 2013-09-14 DIAGNOSIS — G894 Chronic pain syndrome: Secondary | ICD-10-CM | POA: Diagnosis not present

## 2013-09-14 DIAGNOSIS — M62838 Other muscle spasm: Secondary | ICD-10-CM | POA: Diagnosis not present

## 2013-09-14 DIAGNOSIS — G4700 Insomnia, unspecified: Secondary | ICD-10-CM | POA: Diagnosis not present

## 2013-10-12 DIAGNOSIS — G548 Other nerve root and plexus disorders: Secondary | ICD-10-CM | POA: Diagnosis not present

## 2013-10-12 DIAGNOSIS — G894 Chronic pain syndrome: Secondary | ICD-10-CM | POA: Diagnosis not present

## 2013-10-12 DIAGNOSIS — G4700 Insomnia, unspecified: Secondary | ICD-10-CM | POA: Diagnosis not present

## 2013-10-12 DIAGNOSIS — M62838 Other muscle spasm: Secondary | ICD-10-CM | POA: Diagnosis not present

## 2013-11-17 DIAGNOSIS — S143XXS Injury of brachial plexus, sequela: Secondary | ICD-10-CM | POA: Diagnosis not present

## 2013-11-17 DIAGNOSIS — G894 Chronic pain syndrome: Secondary | ICD-10-CM | POA: Diagnosis not present

## 2013-11-17 DIAGNOSIS — G4700 Insomnia, unspecified: Secondary | ICD-10-CM | POA: Diagnosis not present

## 2013-11-17 DIAGNOSIS — M62830 Muscle spasm of back: Secondary | ICD-10-CM | POA: Diagnosis not present

## 2013-11-24 ENCOUNTER — Other Ambulatory Visit (HOSPITAL_COMMUNITY): Payer: Self-pay | Admitting: Pulmonary Disease

## 2013-11-24 ENCOUNTER — Ambulatory Visit (HOSPITAL_COMMUNITY)
Admission: RE | Admit: 2013-11-24 | Discharge: 2013-11-24 | Disposition: A | Discharge status: Home / Self Care | Payer: BC Managed Care – PPO | Source: Ambulatory Visit | Attending: Pulmonary Disease | Admitting: Pulmonary Disease

## 2013-11-24 DIAGNOSIS — I10 Essential (primary) hypertension: Secondary | ICD-10-CM | POA: Diagnosis not present

## 2013-11-24 DIAGNOSIS — R0989 Other specified symptoms and signs involving the circulatory and respiratory systems: Secondary | ICD-10-CM

## 2013-11-24 DIAGNOSIS — R05 Cough: Secondary | ICD-10-CM | POA: Insufficient documentation

## 2013-11-24 DIAGNOSIS — J209 Acute bronchitis, unspecified: Secondary | ICD-10-CM | POA: Diagnosis not present

## 2013-11-24 DIAGNOSIS — J869 Pyothorax without fistula: Secondary | ICD-10-CM | POA: Diagnosis not present

## 2013-11-24 DIAGNOSIS — G894 Chronic pain syndrome: Secondary | ICD-10-CM | POA: Diagnosis not present

## 2013-12-20 DIAGNOSIS — G4700 Insomnia, unspecified: Secondary | ICD-10-CM | POA: Diagnosis not present

## 2013-12-20 DIAGNOSIS — M62830 Muscle spasm of back: Secondary | ICD-10-CM | POA: Diagnosis not present

## 2013-12-20 DIAGNOSIS — S143XXS Injury of brachial plexus, sequela: Secondary | ICD-10-CM | POA: Diagnosis not present

## 2013-12-20 DIAGNOSIS — G894 Chronic pain syndrome: Secondary | ICD-10-CM | POA: Diagnosis not present

## 2013-12-23 DIAGNOSIS — N138 Other obstructive and reflux uropathy: Secondary | ICD-10-CM | POA: Diagnosis not present

## 2013-12-23 DIAGNOSIS — J209 Acute bronchitis, unspecified: Secondary | ICD-10-CM | POA: Diagnosis not present

## 2013-12-23 DIAGNOSIS — I10 Essential (primary) hypertension: Secondary | ICD-10-CM | POA: Diagnosis not present

## 2013-12-23 DIAGNOSIS — K219 Gastro-esophageal reflux disease without esophagitis: Secondary | ICD-10-CM | POA: Diagnosis not present

## 2014-01-19 DIAGNOSIS — G4700 Insomnia, unspecified: Secondary | ICD-10-CM | POA: Diagnosis not present

## 2014-01-19 DIAGNOSIS — G894 Chronic pain syndrome: Secondary | ICD-10-CM | POA: Diagnosis not present

## 2014-01-19 DIAGNOSIS — M62830 Muscle spasm of back: Secondary | ICD-10-CM | POA: Diagnosis not present

## 2014-01-19 DIAGNOSIS — S143XXS Injury of brachial plexus, sequela: Secondary | ICD-10-CM | POA: Diagnosis not present

## 2014-02-08 DIAGNOSIS — I6529 Occlusion and stenosis of unspecified carotid artery: Secondary | ICD-10-CM | POA: Diagnosis not present

## 2014-03-16 DIAGNOSIS — S143XXS Injury of brachial plexus, sequela: Secondary | ICD-10-CM | POA: Diagnosis not present

## 2014-03-16 DIAGNOSIS — G894 Chronic pain syndrome: Secondary | ICD-10-CM | POA: Diagnosis not present

## 2014-03-16 DIAGNOSIS — M62830 Muscle spasm of back: Secondary | ICD-10-CM | POA: Diagnosis not present

## 2014-03-16 DIAGNOSIS — G4700 Insomnia, unspecified: Secondary | ICD-10-CM | POA: Diagnosis not present

## 2014-05-11 DIAGNOSIS — G4700 Insomnia, unspecified: Secondary | ICD-10-CM | POA: Diagnosis not present

## 2014-05-11 DIAGNOSIS — S143XXS Injury of brachial plexus, sequela: Secondary | ICD-10-CM | POA: Diagnosis not present

## 2014-05-11 DIAGNOSIS — M62830 Muscle spasm of back: Secondary | ICD-10-CM | POA: Diagnosis not present

## 2014-05-11 DIAGNOSIS — G894 Chronic pain syndrome: Secondary | ICD-10-CM | POA: Diagnosis not present

## 2014-09-20 ENCOUNTER — Ambulatory Visit (INDEPENDENT_AMBULATORY_CARE_PROVIDER_SITE_OTHER): Payer: BLUE CROSS/BLUE SHIELD | Admitting: Urology

## 2014-09-20 DIAGNOSIS — R972 Elevated prostate specific antigen [PSA]: Secondary | ICD-10-CM | POA: Diagnosis not present

## 2014-09-20 DIAGNOSIS — N529 Male erectile dysfunction, unspecified: Secondary | ICD-10-CM

## 2014-09-20 DIAGNOSIS — R6882 Decreased libido: Secondary | ICD-10-CM | POA: Diagnosis not present

## 2014-09-20 DIAGNOSIS — N401 Enlarged prostate with lower urinary tract symptoms: Secondary | ICD-10-CM | POA: Diagnosis not present

## 2014-11-08 ENCOUNTER — Other Ambulatory Visit: Payer: Self-pay | Admitting: Urology

## 2014-11-08 ENCOUNTER — Ambulatory Visit (INDEPENDENT_AMBULATORY_CARE_PROVIDER_SITE_OTHER): Payer: BLUE CROSS/BLUE SHIELD | Admitting: Urology

## 2014-11-08 DIAGNOSIS — N401 Enlarged prostate with lower urinary tract symptoms: Secondary | ICD-10-CM | POA: Diagnosis not present

## 2014-11-08 DIAGNOSIS — R972 Elevated prostate specific antigen [PSA]: Secondary | ICD-10-CM | POA: Diagnosis not present

## 2014-11-08 DIAGNOSIS — N529 Male erectile dysfunction, unspecified: Secondary | ICD-10-CM | POA: Diagnosis not present

## 2014-11-08 DIAGNOSIS — E291 Testicular hypofunction: Secondary | ICD-10-CM | POA: Diagnosis not present

## 2014-12-07 ENCOUNTER — Other Ambulatory Visit: Payer: Self-pay | Admitting: Urology

## 2014-12-07 DIAGNOSIS — R972 Elevated prostate specific antigen [PSA]: Secondary | ICD-10-CM

## 2014-12-13 ENCOUNTER — Ambulatory Visit (HOSPITAL_COMMUNITY)
Admission: RE | Admit: 2014-12-13 | Discharge: 2014-12-13 | Disposition: A | Discharge status: Home / Self Care | Payer: BLUE CROSS/BLUE SHIELD | Source: Ambulatory Visit | Attending: Urology | Admitting: Urology

## 2014-12-13 DIAGNOSIS — C61 Malignant neoplasm of prostate: Secondary | ICD-10-CM | POA: Insufficient documentation

## 2014-12-13 DIAGNOSIS — R972 Elevated prostate specific antigen [PSA]: Secondary | ICD-10-CM | POA: Insufficient documentation

## 2014-12-13 MED ORDER — GENTAMICIN SULFATE 40 MG/ML IJ SOLN
INTRAMUSCULAR | Status: AC
  Administered 2014-12-13: 160 mg via INTRAMUSCULAR
  Filled 2014-12-13: qty 4

## 2014-12-13 MED ORDER — LIDOCAINE HCL (PF) 2 % IJ SOLN
INTRAMUSCULAR | Status: AC
  Administered 2014-12-13: 10 mL
  Filled 2014-12-13: qty 10

## 2014-12-13 MED ORDER — GENTAMICIN SULFATE 40 MG/ML IJ SOLN
160.0000 mg | Freq: Once | INTRAMUSCULAR | Status: AC
  Administered 2014-12-13: 160 mg via INTRAMUSCULAR

## 2014-12-13 MED ORDER — LIDOCAINE HCL (PF) 2 % IJ SOLN
10.0000 mL | Freq: Once | INTRAMUSCULAR | Status: AC
  Administered 2014-12-13: 10 mL

## 2014-12-13 NOTE — Discharge Instructions (Signed)
Transrectal Ultrasound-Guided Biopsy °A transrectal ultrasound-guided biopsy is a procedure to take samples of tissue from your prostate. Ultrasound images are used to guide the procedure. It is usually done to check the prostate gland for cancer. °BEFORE THE PROCEDURE °· Do not eat or drink after midnight on the night before your procedure. °· Take medicines as your doctor tells you. °· Your doctor may have you stop taking some medicines 5-7 days before the procedure. °· You will be given an enema before your procedure. During an enema, a liquid is put into your butt (rectum) to clear out waste. °· You may have lab tests the day of your procedure. °· Make plans to have someone drive you home. °PROCEDURE °· You will be given medicine to help you relax before the procedure. An IV tube will be put into one of your veins. It will be used to give fluids and medicine. °· You will be given medicine to reduce the risk of infection (antibiotic). °· You will be placed on your side. °· A probe with gel will be put in your butt. This is used to take pictures of your prostate and the area around it. °· A medicine to numb the area is put into your prostate. °· A biopsy needle is then inserted and guided to your prostate. °· Samples of prostate tissue are taken. The needle is removed. °· The samples are sent to a lab to be checked. Results are usually back in 2-3 days. °AFTER THE PROCEDURE °· You will be taken to a room where you will be watched until you are doing okay. °· You may have some pain in the area around your butt. You will be given medicines for this. °· You may be able to go home the same day. Sometimes, an overnight stay in the hospital is needed. °  °This information is not intended to replace advice given to you by your health care provider. Make sure you discuss any questions you have with your health care provider. °  °Document Released: 12/19/2008 Document Revised: 01/05/2013 Document Reviewed:  08/19/2012 °Elsevier Interactive Patient Education ©2016 [employer] Inc. ° °

## 2014-12-23 DIAGNOSIS — G540 Brachial plexus disorders: Secondary | ICD-10-CM | POA: Diagnosis not present

## 2014-12-23 DIAGNOSIS — I10 Essential (primary) hypertension: Secondary | ICD-10-CM | POA: Diagnosis not present

## 2014-12-23 DIAGNOSIS — C61 Malignant neoplasm of prostate: Secondary | ICD-10-CM | POA: Diagnosis not present

## 2014-12-23 DIAGNOSIS — G894 Chronic pain syndrome: Secondary | ICD-10-CM | POA: Diagnosis not present

## 2014-12-26 DIAGNOSIS — M62830 Muscle spasm of back: Secondary | ICD-10-CM | POA: Diagnosis not present

## 2014-12-26 DIAGNOSIS — G4700 Insomnia, unspecified: Secondary | ICD-10-CM | POA: Diagnosis not present

## 2014-12-26 DIAGNOSIS — G894 Chronic pain syndrome: Secondary | ICD-10-CM | POA: Diagnosis not present

## 2014-12-26 DIAGNOSIS — S143XXS Injury of brachial plexus, sequela: Secondary | ICD-10-CM | POA: Diagnosis not present

## 2014-12-27 ENCOUNTER — Institutional Professional Consult (permissible substitution) (INDEPENDENT_AMBULATORY_CARE_PROVIDER_SITE_OTHER): Payer: BLUE CROSS/BLUE SHIELD | Admitting: Urology

## 2014-12-27 DIAGNOSIS — C61 Malignant neoplasm of prostate: Secondary | ICD-10-CM

## 2015-01-18 ENCOUNTER — Institutional Professional Consult (permissible substitution): Payer: BLUE CROSS/BLUE SHIELD | Admitting: Neurology

## 2015-01-18 ENCOUNTER — Ambulatory Visit (INDEPENDENT_AMBULATORY_CARE_PROVIDER_SITE_OTHER): Payer: BLUE CROSS/BLUE SHIELD | Admitting: Neurology

## 2015-01-18 ENCOUNTER — Encounter: Payer: Self-pay | Admitting: Neurology

## 2015-01-18 VITALS — BP 132/66 | HR 64 | Resp 16 | Ht 74.0 in | Wt 206.0 lb

## 2015-01-18 DIAGNOSIS — R0681 Apnea, not elsewhere classified: Secondary | ICD-10-CM

## 2015-01-18 DIAGNOSIS — R0683 Snoring: Secondary | ICD-10-CM | POA: Diagnosis not present

## 2015-01-18 DIAGNOSIS — R351 Nocturia: Secondary | ICD-10-CM | POA: Diagnosis not present

## 2015-01-18 DIAGNOSIS — G894 Chronic pain syndrome: Secondary | ICD-10-CM | POA: Diagnosis not present

## 2015-01-18 DIAGNOSIS — E663 Overweight: Secondary | ICD-10-CM

## 2015-01-18 DIAGNOSIS — F1190 Opioid use, unspecified, uncomplicated: Secondary | ICD-10-CM | POA: Diagnosis not present

## 2015-01-18 NOTE — Progress Notes (Signed)
Subjective:    Patient ID: Kenneth [name] is a 60 y.o. male.  HPI     Star Age, MD, PhD [facility] [address], [address] [place], [place] [address]  Dear Dr. Greta Doom,   I saw your patient, Kenneth [name], upon your kind request in my neurologic clinic today for initial consultation of his sleep disorder, in particular, concern for underlying obstructive sleep apnea. The patient is accompanied by his wife today. As you know, Kenneth [name] is a 60 year old right-handed gentleman with an underlying medical history of chronic pain syndrome with history of thoracic outlet syndrome, status post ACDF in October 2013, thoracic outlet surgery on 05/13/2007, DVT left upper arm, reflux disease, hypertension, and chronic narcotic pain medication (fentanyl patch), insomnia, opioid-induced constipation, muscle spasm, history of brachial plexus injury, and overweight state, who reports snoring and difficulty with sleep maintenance as well as sleep initiation for many years. His wife has noticed snorting sounds in breathing pauses while he is asleep. They do not typically sleep together any longer. He has been on Ambien in the past for several years. 2 years ago he was switched to trazodone which currently is 50 mg strength 1-2 pills each night. He typically takes one pill each night. In addition, he also takes mirtazapine 15 mg each night. He takes alprazolam 0.5 mg twice daily or 3 times a day. He denies restless leg symptoms or leg twitching at night. He does not watch TV in bed. Typically he will be in bed between 11:30 PM and 12:30 AM. He does not stay asleep. He has nocturia, typically once per night on average. He has recently been diagnosed with prostate cancerExce. He has been on Lyrica for his neck and left upper extremity pain with good results. He has a variable rise time. He has no family history of OSA. He has gained weight slowly over the past several years. He does not drink  alcohol. He quit smoking about 10 years ago. He drinks caffeine in the form of sodas, typically 2 16 ounce bottles per day and some decaffeinated tea. He avoids caffeine after 5 or 6 PM. He chews tobacco. He is on disability. His Epworth sleepiness score is 1 out of 24, his fatigue score is 54 out of 63. I reviewed your office note from 12/26/2014, which you kindly included.  His Past Medical History Is Significant For: Past Medical History  Diagnosis Date  . PONV (postoperative nausea and vomiting)   . Anxiety   . Collapsed lung 2010  . Neuropathy of hand     left  . Neuromuscular disorder ([place])   . Reflex sympathetic dystrophy of the arm     left arm  . GERD (gastroesophageal reflux disease)   . 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 Chronic pain disorder     left arm, shoulder and chest ;thoracic syndrome  . History of thoracic outlet syndrome 2008    rib removed in 2009  . Carotid artery occlusion     right  . Depression   . Insomnia due to medical condition   . Snoring   . Pneumonia 2010  . History of blood transfusion 2010  . Fibromyalgia     "left shoulder area" (10/24/2011)  . RSD (reflex sympathetic dystrophy)     "left shoulder and left arm" (10/24/2011)  . Prostate cancer [facility])     His Past Surgical History Is Significant For: Past Surgical History  Procedure Laterality  Date  . Cystectomy      left hand x 3  . Rib resection  2009    "left anterior" (10/24/2011)  . Knee arthroscopy  1987    right  . Vascular surgery      to remove  blood clots  . Anterior cervical decomp/discectomy fusion  10/24/2011  . Eye surgery    . Refractive surgery  2003    bilaterally  . Anterior cervical decomp/discectomy fusion  10/24/2011    Procedure: ANTERIOR CERVICAL DECOMPRESSION/DISCECTOMY FUSION 2 LEVELS;  Surgeon: Melina Schools, MD;  Location: [facility];  Service: Orthopedics;  Laterality: N/A;  ACDF C3-5 C-ARM SKYTRON TABLE SYNTHES VECTOR TITAN CAGE NEURO MONITORING SYTEM     His Family History Is Significant For: Family History  Problem Relation Age of Onset  . Diabetes Mother     His Social History Is Significant For: Social History   Social History  . Marital Status: Married    Spouse Name: N/A  . Number of Children: 2  . Years of Education: College   Occupational History  . Disabled    Social History Main Topics  . Smoking status: Former Smoker -- 1.00 packs/day for 30 years    Types: Cigarettes    Quit date: 10/20/2004  . Smokeless tobacco: Current User    Types: Snuff  . Alcohol Use: No  . Drug Use: No  . Sexual Activity: Yes   Other Topics Concern  . None   Social History Narrative   Drinks 2 sodas a day    His Allergies Are:  No Known Allergies:   His Current Medications Are:  Outpatient Encounter Prescriptions as of 01/18/2015  Medication Sig  . ALPRAZolam (XANAX) 0.5 MG tablet Take 0.5 mg by mouth 4 (four) times daily.  Marland Kitchen aspirin EC 81 MG tablet Take 81 mg by mouth daily.  Marland Kitchen b complex vitamins capsule Take 1 capsule by mouth daily.  . cetirizine (ZYRTEC) 10 MG tablet Take 10 mg by mouth daily.  . Cinnamon 500 MG capsule Take 1,000 mg by mouth daily.  . clomiPHENE (CLOMID) 50 MG tablet   . cyclobenzaprine (FLEXERIL) 5 MG tablet Take 5 mg by mouth 3 (three) times daily as needed for muscle spasms.  . fentaNYL (DURAGESIC - DOSED MCG/HR) 25 MCG/HR Place 1 patch onto the skin every other day.  Marland Kitchen KRILL OIL PO Take 1 capsule by mouth daily.  . mirtazapine (REMERON) 15 MG tablet Take 15 mg by mouth at bedtime.  . naproxen sodium (ANAPROX) 220 MG tablet Take 220 mg by mouth 1 day or 1 dose.  . polyethylene glycol (MIRALAX / GLYCOLAX) packet Take 17 g by mouth daily. Take 1 packet daily until bowels become regular  . pregabalin (LYRICA) 75 MG capsule Take 225 mg by mouth 2 (two) times daily.  . Probiotic Product (PROBIOTIC DAILY PO) Take by mouth.  . RABEprazole (ACIPHEX) 20 MG tablet Take 20 mg by mouth daily.  . tamsulosin  (FLOMAX) 0.4 MG CAPS capsule   . traZODone (DESYREL) 50 MG tablet Take by mouth at bedtime.  . [DISCONTINUED] diazepam (VALIUM) 5 MG tablet Take 1 tablet (5 mg total) by mouth every 8 (eight) hours as needed for anxiety (and spasm).  . [DISCONTINUED] l-methylfolate-B6-B12 (METANX) 3-35-2 MG TABS Take 1 tablet by mouth daily.  . [DISCONTINUED] levofloxacin (LEVAQUIN) 500 MG tablet TAKE 1 TABLET BY MOUTH THE DAY BEFORE PROCEDURE, TAKE 1 TAB THE DAY OF, AND 1 TAB THE DAY AFTER  . [DISCONTINUED]  ondansetron (ZOFRAN) 4 MG tablet Take 1 tablet (4 mg total) by mouth every 8 (eight) hours as needed for nausea. MAX 3 pills daily  . [DISCONTINUED] oxycodone (OXY-IR) 5 MG capsule Take 5 mg by mouth every 4 (four) hours as needed. For pain  . [DISCONTINUED] zolpidem (AMBIEN) 10 MG tablet Take 5-10 mg by mouth at bedtime as needed. For sleep   No facility-administered encounter medications on file as of 01/18/2015.  :  Review of Systems:  Out of a complete 14 point review of systems, all are reviewed and negative with the exception of these symptoms as listed below:   Review of Systems  Constitutional: Positive for fatigue.  Musculoskeletal:       Aching muscles   Neurological: Positive for weakness and numbness.       Patient has trouble falling and staying asleep, wakes up feeling tired, daytime tiredness, denies taking naps, snoring, witnessed apnea.  Memory loss, confusion.   Hematological: Bruises/bleeds easily.  Psychiatric/Behavioral:       Depression, anxiety, not enough sleep, decreased energy   Epworth Sleepiness Scale 0= would never doze 1= slight chance of dozing 2= moderate chance of dozing 3= high chance of dozing  Sitting and reading:0 Watching TV:1 Sitting inactive in a public place (ex. Theater or meeting):0 As a passenger in a car for an hour without a break:0 Lying down to rest in the afternoon:0 Sitting and talking to someone:0 Sitting quietly after lunch (no alcohol):0 In  a car, while stopped in traffic:0 Total:1 Objective:  Neurologic Exam  Physical Exam Physical Examination:   Filed Vitals:   01/18/15 1317  BP: 132/66  Pulse: 64  Resp: 16    General Examination: The patient is a very pleasant 60 y.o. male in no acute distress. He appears well-developed and well-nourished and well groomed.   HEENT: Normocephalic, atraumatic, pupils are equal, round and reactive to light and accommodation. Extraocular tracking is good without limitation to gaze excursion or nystagmus noted. Normal smooth pursuit is noted. Hearing is grossly intact. Face is symmetric with normal facial animation and normal facial sensation. Speech is clear with no dysarthria noted. There is no hypophonia. There is no lip, neck/head, jaw or voice tremor. Neck is has decrease in range of motion. There are no carotid bruits on auscultation. Oropharynx exam reveals: moderate mouth dryness, adequate dental hygiene and mild airway crowding, due to  narrow airway entry and thicker tongue. Mallampati is class II. Tongue protrudes centrally and palate elevates symmetrically. Tonsils are small. Neck size is 15.75 inches. He has a Mild overbite. Nasal inspection reveals no significant nasal mucosal bogginess or redness and no septal deviation.   Chest: Clear to auscultation without wheezing, rhonchi or crackles noted.  Heart: S1+S2+0, regular and normal without murmurs, rubs or gallops noted.   Abdomen: Soft, non-tender and non-distended with normal bowel sounds appreciated on auscultation.  Extremities: There is no pitting edema in the distal lower extremities bilaterally. Pedal pulses are intact.  Skin: Warm and dry without trophic changes noted. There are no varicose veins.  Musculoskeletal: exam reveals no obvious joint deformities, tenderness or joint swelling or erythema with the exception of decrease in range of motion of the left shoulder and left upper extremity.   Neurologically:  Mental  status: The patient is awake, alert and oriented in all 4 spheres. His immediate and remote memory, attention, language skills and fund of knowledge are appropriate. There is no evidence of aphasia, agnosia, apraxia or anomia. Speech is  clear with normal prosody and enunciation. Thought process is linear. Mood is normal and affect is constricted.  Cranial nerves II - XII are as described above under HEENT exam. In addition: shoulder shrug is normal with equal shoulder height noted. Motor exam: Normal bulk, strength and tone is noted. There is no drift, tremor or rebound. Romberg is negative. Reflexes are 1+ throughout. Fine motor skills and coordination: intact. Heel to shin is unremarkable bilaterally. There is no truncal or gait ataxia.  Sensory exam: intact to light touch.  Gait, station and balance: He stands easily. No veering to one side is noted. No leaning to one side is noted. Posture is age-appropriate and stance is narrow based. Gait shows normal stride length and normal pace. No problems turning are noted. He turns en bloc. Tandem walk is slightly difficult.   Assessment and Plan:   In summary, Kenneth [name] is a very pleasant 60 y.o.-year old male with an underlying medical history of chronic pain syndrome with history of thoracic outlet syndrome, status post ACDF in October 2013, thoracic outlet surgery on 05/13/2007, DVT left upper arm, reflux disease, hypertension, and chronic narcotic pain medication (fentanyl patch), insomnia, opioid-induced constipation, muscle spasm, history of brachial plexus injury, and overweight state, whose history and physical exam are somewhat concerning for underlying sleep-disordered breathing. While he does not give a telltale history for OSA, he is at risk for sleep disordered breathing and particularly central sleep apnea secondary to his sedating medications, in particular since he is on narcotic pain medication on a chronic basis. In addition, he takes  several other sedating medications.  I had a long chat with the patient and his wife about my findings and the diagnosis of OSA and central sleep apnea, its prognosis and treatment options. We talked about medical treatments, surgical interventions and non-pharmacological approaches. I explained in particular the risks and ramifications of untreated moderate to severe OSA, especially with respect to developing cardiovascular disease down the Road, including congestive heart failure, difficult to treat hypertension, cardiac arrhythmias, or stroke. Even type 2 diabetes has, in part, been linked to untreated OSA. Symptoms of untreated OSA include daytime sleepiness, memory problems, mood irritability and mood disorder such as depression and anxiety, lack of energy, as well as recurrent headaches, especially morning headaches. We talked about trying to maintain a healthy lifestyle in general, as well as the importance of weight control. I encouraged the patient to eat healthy, exercise daily and keep well hydrated, to keep a scheduled bedtime and wake time routine, to not skip any meals and eat healthy snacks in between meals. I advised the patient not to drive when feeling sleepy. I recommended the following at this time: sleep study with potential positive airway pressure titration. (We will score hypopneas at 4% and split the sleep study into diagnostic and treatment portion, if the estimated. 2 hour AHI is >15/h).   I explained the sleep test procedure to the patient and also outlined possible surgical and non-surgical treatment options of OSA, including the use of a custom-made dental device (which would require a referral to a specialist dentist or oral surgeon), upper airway surgical options, such as pillar implants, radiofrequency surgery, tongue base surgery, and UPPP (which would involve a referral to an ENT surgeon). Rarely, jaw surgery such as mandibular advancement may be considered.  I also explained  the CPAP treatment option to the patient, who indicated that he would be willing to try CPAP if the need arises. I explained  the importance of being compliant with PAP treatment, not only for insurance purposes but primarily to improve His symptoms, and for the patient's long term health benefit, including to reduce His cardiovascular risks. I answered all their questions today and the patient and his wife were in agreement. I would like to see him back after the sleep study is completed and encouraged him to call with any interim questions, concerns, problems or updates.   Thank you very much for allowing me to participate in the care of this nice patient. If I can be of any further assistance to you please do not hesitate to call me at [phone].  Sincerely,   Star Age, MD, PhD

## 2015-01-18 NOTE — Patient Instructions (Signed)

## 2015-01-27 DIAGNOSIS — M62830 Muscle spasm of back: Secondary | ICD-10-CM | POA: Diagnosis not present

## 2015-01-27 DIAGNOSIS — S143XXS Injury of brachial plexus, sequela: Secondary | ICD-10-CM | POA: Diagnosis not present

## 2015-01-27 DIAGNOSIS — G4700 Insomnia, unspecified: Secondary | ICD-10-CM | POA: Diagnosis not present

## 2015-01-27 DIAGNOSIS — Z79891 Long term (current) use of opiate analgesic: Secondary | ICD-10-CM | POA: Diagnosis not present

## 2015-01-27 DIAGNOSIS — G894 Chronic pain syndrome: Secondary | ICD-10-CM | POA: Diagnosis not present

## 2015-02-07 ENCOUNTER — Ambulatory Visit (INDEPENDENT_AMBULATORY_CARE_PROVIDER_SITE_OTHER): Payer: BLUE CROSS/BLUE SHIELD | Admitting: Neurology

## 2015-02-07 DIAGNOSIS — G4734 Idiopathic sleep related nonobstructive alveolar hypoventilation: Secondary | ICD-10-CM

## 2015-02-07 DIAGNOSIS — G4733 Obstructive sleep apnea (adult) (pediatric): Secondary | ICD-10-CM

## 2015-02-07 DIAGNOSIS — G4720 Circadian rhythm sleep disorder, unspecified type: Secondary | ICD-10-CM

## 2015-02-08 NOTE — Sleep Study (Signed)
Please see the scanned sleep study interpretation located in the procedure tab in the chart view section.  

## 2015-02-13 ENCOUNTER — Telehealth: Payer: Self-pay | Admitting: Neurology

## 2015-02-13 DIAGNOSIS — G4734 Idiopathic sleep related nonobstructive alveolar hypoventilation: Secondary | ICD-10-CM

## 2015-02-13 DIAGNOSIS — G4733 Obstructive sleep apnea (adult) (pediatric): Secondary | ICD-10-CM

## 2015-02-13 NOTE — Telephone Encounter (Signed)
Patient referred by Dr. Greta Doom, seen by me on 01/18/15, diagnostic PSG on 02/07/15, ins: [employer] and MCR.   Please call and notify the patient that the recent sleep study did confirm the diagnosis of severe obstructive sleep apnea and that I recommend treatment for this in the form of CPAP. This will require a repeat sleep study for proper titration and mask fitting. Please explain to patient and arrange for a CPAP titration study. I have placed an order in the chart. Thanks, and please route to [facility] for scheduling next sleep study.  Star Age, MD, PhD [facility])

## 2015-02-14 NOTE — Telephone Encounter (Signed)
LM on both numbers to call back for results.  

## 2015-02-15 NOTE — Telephone Encounter (Signed)
I spoke to patient and he is aware of results and recommendations. He would like to come into sleep lab during the day to try out different CPAP masks before scheduling sleep study. He said that [place] advised that he would be able to do this, he just needs to set a time.

## 2015-02-15 NOTE — Telephone Encounter (Signed)
Patient returned Diana's call

## 2015-06-06 ENCOUNTER — Ambulatory Visit (INDEPENDENT_AMBULATORY_CARE_PROVIDER_SITE_OTHER): Payer: BLUE CROSS/BLUE SHIELD | Admitting: Urology

## 2015-06-06 DIAGNOSIS — N401 Enlarged prostate with lower urinary tract symptoms: Secondary | ICD-10-CM | POA: Diagnosis not present

## 2015-06-06 DIAGNOSIS — E291 Testicular hypofunction: Secondary | ICD-10-CM | POA: Diagnosis not present

## 2015-06-06 DIAGNOSIS — C61 Malignant neoplasm of prostate: Secondary | ICD-10-CM

## 2015-07-17 ENCOUNTER — Institutional Professional Consult (permissible substitution): Payer: BLUE CROSS/BLUE SHIELD | Admitting: Internal Medicine

## 2015-10-30 ENCOUNTER — Other Ambulatory Visit: Payer: Self-pay | Admitting: Urology

## 2015-10-30 DIAGNOSIS — R972 Elevated prostate specific antigen [PSA]: Secondary | ICD-10-CM

## 2015-10-31 ENCOUNTER — Ambulatory Visit (INDEPENDENT_AMBULATORY_CARE_PROVIDER_SITE_OTHER): Payer: BLUE CROSS/BLUE SHIELD | Admitting: Internal Medicine

## 2015-10-31 ENCOUNTER — Encounter: Payer: Self-pay | Admitting: Internal Medicine

## 2015-10-31 VITALS — BP 142/78 | HR 79 | Ht 74.0 in | Wt 215.2 lb

## 2015-10-31 DIAGNOSIS — G4733 Obstructive sleep apnea (adult) (pediatric): Secondary | ICD-10-CM | POA: Diagnosis not present

## 2015-10-31 DIAGNOSIS — Z23 Encounter for immunization: Secondary | ICD-10-CM

## 2015-10-31 DIAGNOSIS — G4701 Insomnia due to medical condition: Secondary | ICD-10-CM | POA: Diagnosis not present

## 2015-10-31 DIAGNOSIS — G4700 Insomnia, unspecified: Secondary | ICD-10-CM | POA: Insufficient documentation

## 2015-10-31 NOTE — Assessment & Plan Note (Signed)
Primary factor is probably chronic pain. He says trazodone has helped more than Ambien so we will continue that.

## 2015-10-31 NOTE — Progress Notes (Signed)
10/31/2015-60 year old male former smoker referred courtesy of Dr.Bodea for management of OSA. NPSG 02/07/2015-[facility] 51.6/hour, desaturation to 78%. He couldn't tolerate CPAP on split protocol. Epworth score was 1/24. FOLLOW FOR:  referred by Dr. Greta Doom; Sleep Study done 02/07/15; CPAP Titration Study 02/13/15; patient did not do well with CPAP Titration could not wear masks--claustrophobia, congestion.; pt recently dx with Prostate Cancer..  Medical history of depression, insomnia from medical condition, thoracic outlet syndrome, chronic pain disorder, Bright disease, GERD, reflex sympathetic dystrophy. Prostate Ca. history of pneumonia with empyema and pneumothorax requiring surgical drainage. Patient reports he was told by his pain clinic that if he didn't come to be treated for OSA then he wouldn't be given his fentanyl patches any longer. He says he was resolving a respiratory infection when he had his original sleep study. He says he was confined to sleeping supine but usually sleeps on his sides. During split protocol CPAP titration he says they started the pressure too high and he couldn't exhale. He questions the overall results of that study and doubts if it was representative. He considers pain to be his primary sleep problem. Trazodone works better than Ambien to help initiate and maintain sleep. Cervical spine surgery but no ENT surgery. Denies active lung disease and denies shortness of breath, wheeze or cough. Wife tells him he snores loudly and she sometimes sleeps in another room. He minimizes daytime sleepiness and denies difficulty driving.  Prior to Admission medications   Medication Sig Start Date End Date Taking? Authorizing Provider  ALPRAZolam Duanne Moron) 0.5 MG tablet Take 0.5 mg by mouth 4 (four) times daily. 11/18/14  Yes Historical Provider, MD  aspirin EC 81 MG tablet Take 81 mg by mouth daily.   Yes Historical Provider, MD  b complex vitamins capsule Take 1 capsule by  mouth daily.   Yes Historical Provider, MD  cetirizine (ZYRTEC) 10 MG tablet Take 10 mg by mouth daily.   Yes Historical Provider, MD  Cinnamon 500 MG capsule Take 1,000 mg by mouth daily.   Yes Historical Provider, MD  clomiPHENE (CLOMID) 50 MG tablet  01/16/15  Yes Historical Provider, MD  cyclobenzaprine (FLEXERIL) 5 MG tablet Take 5 mg by mouth 3 (three) times daily as needed for muscle spasms.   Yes Historical Provider, MD  fentaNYL (DURAGESIC - DOSED MCG/HR) 25 MCG/HR Place 1 patch onto the skin every other day.   Yes Historical Provider, MD  KRILL OIL PO Take 1 capsule by mouth daily.   Yes Historical Provider, MD  mirtazapine (REMERON) 15 MG tablet Take 15 mg by mouth at bedtime.   Yes Historical Provider, MD  naproxen sodium (ANAPROX) 220 MG tablet Take 220 mg by mouth 1 day or 1 dose.   Yes Historical Provider, MD  polyethylene glycol (MIRALAX / GLYCOLAX) packet Take 17 g by mouth daily. Take 1 packet daily until bowels become regular 10/25/11  Yes Carron Curie, PA-C  pregabalin (LYRICA) 75 MG capsule Take 225 mg by mouth 2 (two) times daily.   Yes Historical Provider, MD  Probiotic Product (PROBIOTIC DAILY PO) Take by mouth.   Yes Historical Provider, MD  RABEprazole (ACIPHEX) 20 MG tablet Take 20 mg by mouth daily.   Yes Historical Provider, MD  tamsulosin (FLOMAX) 0.4 MG CAPS capsule  01/16/15  Yes Historical Provider, MD  traZODone (DESYREL) 50 MG tablet Take by mouth at bedtime. 10/22/14  Yes Historical Provider, MD   Past Medical History:  Diagnosis Date  . Anxiety   .  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 Carotid artery occlusion    right  . Chronic pain disorder    left arm, shoulder and chest ;thoracic syndrome  . Collapsed lung 2010  . Depression   . Fibromyalgia    "left shoulder area" (10/24/2011)  . GERD (gastroesophageal reflux disease)   . History of blood transfusion 2010  . History of thoracic outlet syndrome 2008   rib removed in 2009  . Insomnia due to medical  condition   . Neuromuscular disorder ([place])   . Neuropathy of hand    left  . Pneumonia 2010  . PONV (postoperative nausea and vomiting)   . Prostate cancer ([place])   . 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 RSD (reflex sympathetic dystrophy)    "left shoulder and left arm" (10/24/2011)  . Snoring    Past Surgical History:  Procedure Laterality Date  . ANTERIOR CERVICAL DECOMP/DISCECTOMY FUSION  10/24/2011  . ANTERIOR CERVICAL DECOMP/DISCECTOMY FUSION  10/24/2011   Procedure: ANTERIOR CERVICAL DECOMPRESSION/DISCECTOMY FUSION 2 LEVELS;  Surgeon: Melina Schools, MD;  Location: [place];  Service: Orthopedics;  Laterality: N/A;  ACDF C3-5 C-ARM SKYTRON TABLE SYNTHES VECTOR TITAN CAGE NEURO MONITORING SYTEM  . CYSTECTOMY     left hand x 3  . EYE SURGERY    . KNEE ARTHROSCOPY  1987   right  . REFRACTIVE SURGERY  2003   bilaterally  . RIB RESECTION  2009   "left anterior" (10/24/2011)  . VASCULAR SURGERY     to remove  blood clots   Family History  Problem Relation Age of Onset  . Diabetes Mother    Social History   Social History  . Marital status: Married    Spouse name: N/A  . Number of children: 2  . Years of education: College   Occupational History  . Disabled    Social History Main Topics  . Smoking status: Former Smoker    Packs/day: 1.00    Years: 30.00    Types: Cigarettes    Quit date: 10/20/2004  . Smokeless tobacco: Current User    Types: Snuff  . Alcohol use No  . Drug use: No  . Sexual activity: Yes   Other Topics Concern  . Not on file   Social History Narrative   Drinks 2 sodas a day   ROS-see HPI   Negative unless "+" Constitutional:    weight loss, night sweats, fevers, chills, fatigue, lassitude. HEENT:    headaches, difficulty swallowing, tooth/dental problems, sore throat,       sneezing, itching, ear ache, nasal congestion, post nasal drip, snoring CV:    chest pain, orthopnea, PND, swelling in lower extremities,  anasarca,                                              dizziness, palpitations Resp:   shortness of breath with exertion or at rest.                productive cough,   non-productive cough, coughing up of blood.              change in color of mucus.  wheezing.   Skin:    rash or lesions. GI:  No-   heartburn, indigestion, abdominal pain, nausea, vomiting, diarrhea,  change in bowel habits, loss of appetite GU: dysuria, change in color of urine, no urgency or frequency.   flank pain. MS:   +joint pain, stiffness, decreased range of motion, back pain. Neuro-     nothing unusual Psych:  change in mood or affect.  depression or anxiety.   memory loss.  OBJ- Physical Exam General- Alert, Oriented, Affect-appropriate, Distress- none acute, + mild abdominal obesity Skin- rash-none, lesions- none, excoriation- none Lymphadenopathy- none Head- atraumatic            Eyes- Gross vision intact, PERRLA, conjunctivae and secretions clear            Ears- Hearing, canals-normal            Nose- Clear, no-Septal dev, mucus, polyps, erosion, perforation             Throat- Mallampati II , soft palate against posterior pharynx, mucosa clear ,                         drainage- none, tonsils- atrophic, + own teeth Neck- flexible , trachea midline, no stridor , thyroid nl, carotid no bruit Chest - symmetrical excursion , unlabored           Heart/CV- RRR , no murmur , no gallop  , no rub, nl s1 s2                           - JVD- none , edema- none, stasis changes- none, varices- none           Lung- clear to P&A, wheeze- none, cough- none , dullness-none, rub- none           Chest wall-  Abd-  Br/ Gen/ Rectal- Not done, not indicated Extrem- cyanosis- none, clubbing, none, atrophy- none, strength- nl Neuro- grossly intact to observation

## 2015-10-31 NOTE — Assessment & Plan Note (Addendum)
He questions the validity of his diagnostic sleep study from January because he felt constrained to lie on his back. In order to gain his acceptance to work with me on treatment options I think we should repeat a sleep study to confirm. Ideally a home sleep study where he can find his usual positions may be more representative. Plan schedule sleep study We had a preliminary discussion of alternatives to CPAP and can explore that further depending on results.

## 2015-10-31 NOTE — Patient Instructions (Signed)
Order- schedule unattended home sleep test    Dx OSA  My nurse will work with you to schedule an office folow-up to go over results and discuss options once we know when your study can be done.

## 2015-11-06 ENCOUNTER — Telehealth: Payer: Self-pay | Admitting: *Deleted

## 2015-11-06 NOTE — Telephone Encounter (Signed)
Called and spoke with pt and he is aware of CY recs.  He stated that he is not even sure if he could tolerate that either.  He will think about it and call us back in the next few days to let us know what he wants to do.

## 2015-11-06 NOTE — Telephone Encounter (Signed)
Spoke with patient-states it is pointless to have NPSG in lab-unable to sleep with anything on his head/face.  Pt states he and CY had discussed this at [place].   Pt does not want to have NPSG done in lab at this time.   FYI to [facility]

## 2015-11-06 NOTE — Telephone Encounter (Signed)
Ok to cancel sleep study. If he is willing, we can refer him to orthodontist Dr Oneal Grout to consider fitting an oral appliance mouthpiece to treat OSA, instead of CPAP. He would need to have Dr Ron Parker show him what these look like.

## 2015-11-06 NOTE — Telephone Encounter (Signed)
-----   Message from Deneise Lever, MD sent at 11/03/2015  2:00 PM EDT ----- We need to order a split NPSG for dx OSA because of his insurance  ----- Message ----- From: Joellen Jersey Sent: 11/03/2015   8:56 AM To: Deneise Lever, MD  Order has been denied for home sleep study pt will need in lab study

## 2015-11-09 ENCOUNTER — Ambulatory Visit (HOSPITAL_COMMUNITY): Payer: BLUE CROSS/BLUE SHIELD

## 2015-11-16 ENCOUNTER — Ambulatory Visit (HOSPITAL_COMMUNITY)
Admission: RE | Admit: 2015-11-16 | Discharge: 2015-11-16 | Disposition: A | Discharge status: Home / Self Care | Payer: BLUE CROSS/BLUE SHIELD | Source: Ambulatory Visit | Attending: Urology | Admitting: Urology

## 2015-11-16 DIAGNOSIS — N400 Enlarged prostate without lower urinary tract symptoms: Secondary | ICD-10-CM | POA: Insufficient documentation

## 2015-11-16 DIAGNOSIS — R972 Elevated prostate specific antigen [PSA]: Secondary | ICD-10-CM

## 2015-11-16 DIAGNOSIS — C61 Malignant neoplasm of prostate: Secondary | ICD-10-CM | POA: Insufficient documentation

## 2015-11-16 MED ORDER — GADOBENATE DIMEGLUMINE 529 MG/ML IV SOLN
20.0000 mL | Freq: Once | INTRAVENOUS | Status: AC | PRN
  Administered 2015-11-16: 20 mL via INTRAVENOUS

## 2015-11-29 DIAGNOSIS — E291 Testicular hypofunction: Secondary | ICD-10-CM | POA: Diagnosis not present

## 2015-11-29 DIAGNOSIS — C61 Malignant neoplasm of prostate: Secondary | ICD-10-CM | POA: Diagnosis not present

## 2015-12-11 ENCOUNTER — Other Ambulatory Visit: Payer: Self-pay | Admitting: Urology

## 2015-12-11 DIAGNOSIS — C61 Malignant neoplasm of prostate: Secondary | ICD-10-CM

## 2015-12-12 ENCOUNTER — Encounter (HOSPITAL_COMMUNITY): Payer: Self-pay

## 2015-12-12 ENCOUNTER — Ambulatory Visit (HOSPITAL_COMMUNITY)
Admission: RE | Admit: 2015-12-12 | Discharge: 2015-12-12 | Disposition: A | Discharge status: Home / Self Care | Payer: BLUE CROSS/BLUE SHIELD | Source: Ambulatory Visit | Attending: Urology | Admitting: Urology

## 2015-12-12 ENCOUNTER — Ambulatory Visit (HOSPITAL_COMMUNITY): Admission: RE | Admit: 2015-12-12 | Payer: BLUE CROSS/BLUE SHIELD | Source: Ambulatory Visit

## 2015-12-12 DIAGNOSIS — C61 Malignant neoplasm of prostate: Secondary | ICD-10-CM | POA: Diagnosis not present

## 2015-12-12 DIAGNOSIS — Z8546 Personal history of malignant neoplasm of prostate: Secondary | ICD-10-CM | POA: Insufficient documentation

## 2015-12-12 DIAGNOSIS — R972 Elevated prostate specific antigen [PSA]: Secondary | ICD-10-CM | POA: Diagnosis not present

## 2015-12-12 MED ORDER — LIDOCAINE HCL (PF) 2 % IJ SOLN
10.0000 mL | Freq: Once | INTRAMUSCULAR | Status: AC
  Administered 2015-12-12: 12:00:00

## 2015-12-12 MED ORDER — GENTAMICIN SULFATE 40 MG/ML IJ SOLN
160.0000 mg | Freq: Once | INTRAMUSCULAR | Status: AC
  Administered 2015-12-12: 160 mg via INTRAMUSCULAR

## 2015-12-12 MED ORDER — GENTAMICIN SULFATE 40 MG/ML IJ SOLN
INTRAMUSCULAR | Status: AC
  Administered 2015-12-12: 160 mg via INTRAMUSCULAR
  Filled 2015-12-12: qty 4

## 2015-12-12 MED ORDER — LIDOCAINE HCL (PF) 2 % IJ SOLN
INTRAMUSCULAR | Status: AC
  Filled 2015-12-12: qty 10

## 2016-06-11 ENCOUNTER — Ambulatory Visit (INDEPENDENT_AMBULATORY_CARE_PROVIDER_SITE_OTHER): Payer: BLUE CROSS/BLUE SHIELD | Admitting: Urology

## 2016-06-11 DIAGNOSIS — C61 Malignant neoplasm of prostate: Secondary | ICD-10-CM | POA: Diagnosis not present

## 2016-06-11 DIAGNOSIS — N401 Enlarged prostate with lower urinary tract symptoms: Secondary | ICD-10-CM | POA: Diagnosis not present

## 2016-06-11 DIAGNOSIS — R351 Nocturia: Secondary | ICD-10-CM

## 2016-06-11 DIAGNOSIS — N5201 Erectile dysfunction due to arterial insufficiency: Secondary | ICD-10-CM

## 2016-09-20 ENCOUNTER — Other Ambulatory Visit (INDEPENDENT_AMBULATORY_CARE_PROVIDER_SITE_OTHER): Payer: Self-pay | Admitting: Vascular Surgery

## 2016-09-20 ENCOUNTER — Ambulatory Visit (INDEPENDENT_AMBULATORY_CARE_PROVIDER_SITE_OTHER): Payer: Commercial Managed Care - PPO

## 2016-09-20 ENCOUNTER — Ambulatory Visit (INDEPENDENT_AMBULATORY_CARE_PROVIDER_SITE_OTHER): Payer: Medicare Other | Admitting: Vascular Surgery

## 2016-09-20 DIAGNOSIS — I739 Peripheral vascular disease, unspecified: Principal | ICD-10-CM

## 2016-09-20 DIAGNOSIS — I779 Disorder of arteries and arterioles, unspecified: Secondary | ICD-10-CM

## 2016-10-17 ENCOUNTER — Encounter (INDEPENDENT_AMBULATORY_CARE_PROVIDER_SITE_OTHER): Payer: Self-pay | Admitting: Vascular Surgery

## 2016-12-17 ENCOUNTER — Ambulatory Visit (INDEPENDENT_AMBULATORY_CARE_PROVIDER_SITE_OTHER): Payer: Commercial Managed Care - PPO | Admitting: Urology

## 2016-12-17 DIAGNOSIS — C61 Malignant neoplasm of prostate: Secondary | ICD-10-CM | POA: Diagnosis not present

## 2016-12-17 DIAGNOSIS — N401 Enlarged prostate with lower urinary tract symptoms: Secondary | ICD-10-CM | POA: Diagnosis not present

## 2016-12-17 DIAGNOSIS — R351 Nocturia: Secondary | ICD-10-CM | POA: Diagnosis not present

## 2016-12-17 DIAGNOSIS — E291 Testicular hypofunction: Secondary | ICD-10-CM | POA: Diagnosis not present

## 2017-01-23 ENCOUNTER — Encounter (INDEPENDENT_AMBULATORY_CARE_PROVIDER_SITE_OTHER): Payer: Self-pay | Admitting: *Deleted

## 2017-02-21 ENCOUNTER — Other Ambulatory Visit (HOSPITAL_COMMUNITY): Payer: Self-pay | Admitting: Pulmonary Disease

## 2017-02-21 ENCOUNTER — Ambulatory Visit (HOSPITAL_COMMUNITY)
Admission: RE | Admit: 2017-02-21 | Discharge: 2017-02-21 | Disposition: A | Discharge status: Home / Self Care | Payer: Commercial Managed Care - PPO | Source: Ambulatory Visit | Attending: Pulmonary Disease | Admitting: Pulmonary Disease

## 2017-02-21 DIAGNOSIS — J869 Pyothorax without fistula: Secondary | ICD-10-CM | POA: Diagnosis not present

## 2017-02-21 DIAGNOSIS — R918 Other nonspecific abnormal finding of lung field: Secondary | ICD-10-CM

## 2017-02-21 DIAGNOSIS — R059 Cough, unspecified: Secondary | ICD-10-CM

## 2017-02-21 DIAGNOSIS — R05 Cough: Secondary | ICD-10-CM

## 2017-02-21 DIAGNOSIS — A419 Sepsis, unspecified organism: Secondary | ICD-10-CM | POA: Diagnosis not present

## 2017-02-24 ENCOUNTER — Other Ambulatory Visit (HOSPITAL_COMMUNITY): Payer: Self-pay | Admitting: Pulmonary Disease

## 2017-02-24 ENCOUNTER — Other Ambulatory Visit: Payer: Self-pay

## 2017-02-24 ENCOUNTER — Inpatient Hospital Stay (HOSPITAL_COMMUNITY)
Admission: EM | Admit: 2017-02-24 | Discharge: 2017-03-01 | DRG: 853 | Disposition: A | Discharge status: Home / Self Care | Payer: Commercial Managed Care - PPO | Attending: Internal Medicine | Admitting: Internal Medicine

## 2017-02-24 ENCOUNTER — Ambulatory Visit (HOSPITAL_COMMUNITY)
Admission: RE | Admit: 2017-02-24 | Discharge: 2017-02-24 | Disposition: A | Discharge status: Home / Self Care | Payer: Commercial Managed Care - PPO | Source: Ambulatory Visit | Attending: Pulmonary Disease | Admitting: Pulmonary Disease

## 2017-02-24 ENCOUNTER — Ambulatory Visit (HOSPITAL_COMMUNITY): Payer: Commercial Managed Care - PPO

## 2017-02-24 ENCOUNTER — Encounter (HOSPITAL_COMMUNITY): Payer: Self-pay | Admitting: Emergency Medicine

## 2017-02-24 DIAGNOSIS — Z7982 Long term (current) use of aspirin: Secondary | ICD-10-CM | POA: Diagnosis not present

## 2017-02-24 DIAGNOSIS — Z720 Tobacco use: Secondary | ICD-10-CM | POA: Diagnosis not present

## 2017-02-24 DIAGNOSIS — A419 Sepsis, unspecified organism: Secondary | ICD-10-CM | POA: Diagnosis present

## 2017-02-24 DIAGNOSIS — N400 Enlarged prostate without lower urinary tract symptoms: Secondary | ICD-10-CM | POA: Diagnosis present

## 2017-02-24 DIAGNOSIS — D62 Acute posthemorrhagic anemia: Secondary | ICD-10-CM | POA: Diagnosis not present

## 2017-02-24 DIAGNOSIS — F419 Anxiety disorder, unspecified: Secondary | ICD-10-CM | POA: Diagnosis present

## 2017-02-24 DIAGNOSIS — Z8546 Personal history of malignant neoplasm of prostate: Secondary | ICD-10-CM | POA: Diagnosis not present

## 2017-02-24 DIAGNOSIS — Z79899 Other long term (current) drug therapy: Secondary | ICD-10-CM

## 2017-02-24 DIAGNOSIS — I2510 Atherosclerotic heart disease of native coronary artery without angina pectoris: Secondary | ICD-10-CM | POA: Diagnosis present

## 2017-02-24 DIAGNOSIS — J439 Emphysema, unspecified: Secondary | ICD-10-CM

## 2017-02-24 DIAGNOSIS — K219 Gastro-esophageal reflux disease without esophagitis: Secondary | ICD-10-CM | POA: Diagnosis present

## 2017-02-24 DIAGNOSIS — C61 Malignant neoplasm of prostate: Secondary | ICD-10-CM | POA: Diagnosis present

## 2017-02-24 DIAGNOSIS — Z86718 Personal history of other venous thrombosis and embolism: Secondary | ICD-10-CM

## 2017-02-24 DIAGNOSIS — Z4682 Encounter for fitting and adjustment of non-vascular catheter: Secondary | ICD-10-CM

## 2017-02-24 DIAGNOSIS — J869 Pyothorax without fistula: Secondary | ICD-10-CM | POA: Diagnosis present

## 2017-02-24 DIAGNOSIS — G894 Chronic pain syndrome: Secondary | ICD-10-CM | POA: Diagnosis present

## 2017-02-24 DIAGNOSIS — M797 Fibromyalgia: Secondary | ICD-10-CM | POA: Diagnosis present

## 2017-02-24 DIAGNOSIS — Z9689 Presence of other specified functional implants: Secondary | ICD-10-CM

## 2017-02-24 DIAGNOSIS — G4733 Obstructive sleep apnea (adult) (pediatric): Secondary | ICD-10-CM | POA: Diagnosis present

## 2017-02-24 DIAGNOSIS — F329 Major depressive disorder, single episode, unspecified: Secondary | ICD-10-CM | POA: Diagnosis present

## 2017-02-24 DIAGNOSIS — J189 Pneumonia, unspecified organism: Secondary | ICD-10-CM | POA: Diagnosis present

## 2017-02-24 DIAGNOSIS — G8929 Other chronic pain: Secondary | ICD-10-CM | POA: Diagnosis present

## 2017-02-24 DIAGNOSIS — Z981 Arthrodesis status: Secondary | ICD-10-CM

## 2017-02-24 LAB — BASIC METABOLIC PANEL
Anion gap: 13 (ref 5–15)
BUN: 22 mg/dL — AB (ref 6–20)
CALCIUM: 9 mg/dL (ref 8.9–10.3)
CO2: 24 mmol/L (ref 22–32)
CREATININE: 1.68 mg/dL — AB (ref 0.61–1.24)
Chloride: 95 mmol/L — ABNORMAL LOW (ref 101–111)
GFR calc Af Amer: 49 mL/min — ABNORMAL LOW (ref >60)
GFR, EST NON AFRICAN AMERICAN: 42 mL/min — AB (ref >60)
Glucose, Bld: 172 mg/dL — ABNORMAL HIGH (ref 65–99)
Potassium: 4.1 mmol/L (ref 3.5–5.1)
SODIUM: 132 mmol/L — AB (ref 135–145)

## 2017-02-24 LAB — CBC WITH DIFFERENTIAL/PLATELET
Basophils Absolute: 0 10*3/uL (ref 0.0–0.1)
Basophils Relative: 0 %
EOS ABS: 0.4 10*3/uL (ref 0.0–0.7)
EOS PCT: 2 %
HCT: 38.1 % — ABNORMAL LOW (ref 39.0–52.0)
Hemoglobin: 13.5 g/dL (ref 13.0–17.0)
LYMPHS ABS: 0.9 10*3/uL (ref 0.7–4.0)
Lymphocytes Relative: 5 %
MCH: 31.5 pg (ref 26.0–34.0)
MCHC: 35.4 g/dL (ref 30.0–36.0)
MCV: 89 fL (ref 78.0–100.0)
Monocytes Absolute: 1.5 10*3/uL — ABNORMAL HIGH (ref 0.1–1.0)
Monocytes Relative: 8 %
Neutro Abs: 14.7 10*3/uL — ABNORMAL HIGH (ref 1.7–7.7)
Neutrophils Relative %: 85 %
PLATELETS: 197 10*3/uL (ref 150–400)
RBC: 4.28 MIL/uL (ref 4.22–5.81)
RDW: 12.9 % (ref 11.5–15.5)
WBC: 17.4 10*3/uL — AB (ref 4.0–10.5)

## 2017-02-24 LAB — INFLUENZA PANEL BY PCR (TYPE A & B)
INFLAPCR: NEGATIVE
Influenza B By PCR: NEGATIVE

## 2017-02-24 LAB — POCT I-STAT CREATININE: Creatinine, Ser: 1.6 mg/dL — ABNORMAL HIGH (ref 0.61–1.24)

## 2017-02-24 LAB — I-STAT CG4 LACTIC ACID, ED: Lactic Acid, Venous: 1.31 mmol/L (ref 0.5–1.9)

## 2017-02-24 MED ORDER — FENTANYL 12 MCG/HR TD PT72: 12.0000 ug | MEDICATED_PATCH | TRANSDERMAL | Status: DC

## 2017-02-24 MED ORDER — PIPERACILLIN-TAZOBACTAM 3.375 G IVPB 30 MIN
3.3750 g | Freq: Once | INTRAVENOUS | Status: AC
  Administered 2017-02-25: 3.375 g via INTRAVENOUS
  Filled 2017-02-24: qty 50

## 2017-02-24 MED ORDER — IOPAMIDOL (ISOVUE-300) INJECTION 61%: 100.0000 mL | Freq: Once | INTRAVENOUS | Status: DC | PRN

## 2017-02-24 MED ORDER — SODIUM CHLORIDE 0.9 % IV SOLN: INTRAVENOUS | Status: DC

## 2017-02-24 MED ORDER — PIPERACILLIN-TAZOBACTAM 3.375 G IVPB
3.3750 g | Freq: Three times a day (TID) | INTRAVENOUS | Status: DC
  Administered 2017-02-25 – 2017-02-26 (×3): 3.375 g via INTRAVENOUS
  Filled 2017-02-24 (×5): qty 50

## 2017-02-24 MED ORDER — ACETAMINOPHEN 650 MG RE SUPP: 650.0000 mg | Freq: Four times a day (QID) | RECTAL | Status: DC | PRN

## 2017-02-24 MED ORDER — ACETAMINOPHEN 325 MG PO TABS
650.0000 mg | ORAL_TABLET | Freq: Four times a day (QID) | ORAL | Status: DC | PRN
  Administered 2017-02-25: 02:00:00 650 mg via ORAL
  Filled 2017-02-24: qty 2

## 2017-02-24 MED ORDER — VANCOMYCIN HCL IN DEXTROSE 750-5 MG/150ML-% IV SOLN
750.0000 mg | Freq: Two times a day (BID) | INTRAVENOUS | Status: DC
  Administered 2017-02-25 – 2017-02-27 (×5): 750 mg via INTRAVENOUS
  Filled 2017-02-24 (×9): qty 150

## 2017-02-24 MED ORDER — SODIUM CHLORIDE 0.9 % IV BOLUS (SEPSIS)
1000.0000 mL | Freq: Once | INTRAVENOUS | Status: AC
  Administered 2017-02-24: 1000 mL via INTRAVENOUS

## 2017-02-24 MED ORDER — IOPAMIDOL (ISOVUE-300) INJECTION 61%
75.0000 mL | Freq: Once | INTRAVENOUS | Status: AC | PRN
  Administered 2017-02-24: 75 mL via INTRAVENOUS

## 2017-02-24 MED ORDER — ONDANSETRON HCL 4 MG PO TABS: 4.0000 mg | ORAL_TABLET | Freq: Four times a day (QID) | ORAL | Status: DC | PRN

## 2017-02-24 MED ORDER — ONDANSETRON HCL 4 MG/2ML IJ SOLN: 4.0000 mg | Freq: Four times a day (QID) | INTRAMUSCULAR | Status: DC | PRN

## 2017-02-24 MED ORDER — LEVOFLOXACIN IN D5W 500 MG/100ML IV SOLN
500.0000 mg | INTRAVENOUS | Status: DC
  Administered 2017-02-25 – 2017-02-26 (×2): 500 mg via INTRAVENOUS
  Filled 2017-02-24 (×2): qty 100

## 2017-02-24 MED ORDER — MIRTAZAPINE 15 MG PO TABS
15.0000 mg | ORAL_TABLET | Freq: Once | ORAL | Status: DC
  Filled 2017-02-24 (×2): qty 1

## 2017-02-24 MED ORDER — VANCOMYCIN HCL 10 G IV SOLR
1500.0000 mg | Freq: Once | INTRAVENOUS | Status: AC
  Administered 2017-02-25: 1500 mg via INTRAVENOUS
  Filled 2017-02-24: qty 1500

## 2017-02-24 MED ORDER — PREGABALIN 75 MG PO CAPS
75.0000 mg | ORAL_CAPSULE | Freq: Once | ORAL | Status: DC
  Filled 2017-02-24: qty 3

## 2017-02-24 NOTE — ED Notes (Signed)
Dr. Earlie Counts nurse called and stated, he is to be admitted through the hospitalist for surgery tomorrow. SOB.

## 2017-02-24 NOTE — ED Triage Notes (Signed)
Pt presents from MD office for double pneumonia and bilat effusions as noted on todays CT scans; pts physician consulted surgeon, and is to have thoroscopy tomorrow morning and be admitted tonight

## 2017-02-24 NOTE — H&P (Signed)
History and Physical    Kenneth [name] NLG:[id number] DOB: 1955/09/14 DOA: 02/24/2017  PCP: Sinda Du, MD  Patient coming from: Home.  Chief Complaint: Left-sided chest pain.  HPI: Kenneth [name] is a 62 y.o. male with history of prostate cancer, chronic pain with history of thoracic outlet syndrome with previous history of left upper extremity DVT, sleep apnea was experiencing left-sided chest pain last week.  Pain is mostly pleuritic in nature.  Patient's pain worsened and called up his PCP on February 21, 2017 and was called in [place] which patient took last 4 days.  Today primary care physician had patient seen in the office and had chest x-ray followed by CT chest which showed features concerning for empyema and was told to come to the ER.  Primary care has already discussed with cardiothoracic surgeon Dr. Servando Snare who will be planning to do VATS.  Patient has had a similar episode in 2010 and had a VATS done.  Patient also was recently treated for pneumonia.  ED Course: In the ER patient remained hemodynamically stable.  Blood cultures were sent.  And patient started on empiric antibiotics.  Flu panel was negative.  Review of Systems: As per HPI, rest all negative.   Past Medical History:  Diagnosis Date  . Anxiety   . 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 Carotid artery occlusion    right  . Chronic pain disorder    left arm, shoulder and chest ;thoracic syndrome  . Collapsed lung 2010  . Depression   . Fibromyalgia    "left shoulder area" (10/24/2011)  . GERD (gastroesophageal reflux disease)   . History of blood transfusion 2010  . History of thoracic outlet syndrome 2008   rib removed in 2009  . Insomnia due to medical condition   . Neuromuscular disorder ([place])   . Neuropathy of hand    left  . Pneumonia 2010  . PONV (postoperative nausea and vomiting)   . Prostate cancer ([place])   . 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 RSD (reflex sympathetic  dystrophy)    "left shoulder and left arm" (10/24/2011)  . Snoring     Past Surgical History:  Procedure Laterality Date  . ANTERIOR CERVICAL DECOMP/DISCECTOMY FUSION  10/24/2011  . ANTERIOR CERVICAL DECOMP/DISCECTOMY FUSION  10/24/2011   Procedure: ANTERIOR CERVICAL DECOMPRESSION/DISCECTOMY FUSION 2 LEVELS;  Surgeon: Melina Schools, MD;  Location: [place];  Service: Orthopedics;  Laterality: N/A;  ACDF C3-5 C-ARM SKYTRON TABLE SYNTHES VECTOR TITAN CAGE NEURO MONITORING SYTEM  . CYSTECTOMY     left hand x 3  . EYE SURGERY    . KNEE ARTHROSCOPY  1987   right  . REFRACTIVE SURGERY  2003   bilaterally  . RIB RESECTION  2009   "left anterior" (10/24/2011)  . VASCULAR SURGERY     to remove  blood clots     reports that he quit smoking about 12 years ago. His smoking use included cigarettes. He has a 30.00 pack-year smoking history. His smokeless tobacco use includes snuff. He reports that he does not drink alcohol or use drugs.  No Known Allergies  Family History  Problem Relation Age of Onset  . Diabetes Mother     Prior to Admission medications   Medication Sig Start Date End Date Taking? Authorizing Provider  ALPRAZolam Duanne Moron) 0.5 MG tablet Take 0.5 mg by mouth 2 (two) times daily.  11/18/14  Yes [provider]  anastrozole (ARIMIDEX) 1 MG tablet Take 1 mg by mouth 3 (three) times a week. Sunday, Tuesday and Friday   Yes [provider]  aspirin EC 81 MG tablet Take 81 mg by mouth daily.   Yes [provider]  cetirizine (ZYRTEC) 10 MG tablet Take 10 mg by mouth daily.   Yes [provider]  Cinnamon 500 MG capsule Take 500 mg by mouth daily.    Yes [provider]  Coenzyme Q10 200 MG capsule Take 200 mg by mouth daily.   Yes [provider]  cyclobenzaprine (FLEXERIL) 5 MG tablet Take 5 mg by mouth 3 (three) times daily as needed for muscle spasms.   Yes [provider]  fentaNYL (DURAGESIC - DOSED MCG/HR) 12  MCG/HR Place 12 mcg onto the skin every other day.    Yes [provider]  Javier Docker Oil 500 MG CAPS Take 500 mg by mouth daily.    Yes [provider]  mirtazapine (REMERON) 15 MG tablet Take 15 mg by mouth at bedtime.   Yes [provider]  naproxen sodium (ANAPROX) 220 MG tablet Take 220 mg by mouth daily as needed (for pain or headache).    Yes [provider]  pregabalin (LYRICA) 75 MG capsule Take 75-150 mg by mouth See admin instructions. Take 150 mg by mouth in the morning and take 75 mg by mouth at supper   Yes [provider]  Probiotic Product (PROBIOTIC DAILY PO) Take 1 capsule by mouth daily.    Yes [provider]  RABEprazole (ACIPHEX) 20 MG tablet Take 20 mg by mouth daily.   Yes [provider]  SUPER B COMPLEX/C PO Take 1 tablet by mouth daily.   Yes [provider]  tamsulosin (FLOMAX) 0.4 MG CAPS capsule Take 0.4 mg by mouth daily.  01/16/15  Yes [provider]  traZODone (DESYREL) 50 MG tablet Take 50 mg by mouth at bedtime.  10/22/14  Yes [provider]  polyethylene glycol (MIRALAX / GLYCOLAX) packet Take 17 g by mouth daily. Take 1 packet daily until bowels become regular Patient not taking: Reported on 02/24/2017 10/25/11   Carron Curie, PA-C    Physical Exam: Vitals:   02/24/17 2142 02/24/17 2200 02/24/17 2230 02/24/17 2300  BP: (!) 145/69 137/78 123/64 (!) 162/72  Pulse: 100 98 (!) 104 (!) 110  Resp: (!) 22 (!) 21 (!) 22 20  Temp:      TempSrc:      SpO2: 95% 94% 96% 96%  Weight:      Height:          Constitutional: Moderately built and nourished. Vitals:   02/24/17 2142 02/24/17 2200 02/24/17 2230 02/24/17 2300  BP: (!) 145/69 137/78 123/64 (!) 162/72  Pulse: 100 98 (!) 104 (!) 110  Resp: (!) 22 (!) 21 (!) 22 20  Temp:      TempSrc:      SpO2: 95% 94% 96% 96%  Weight:      Height:       Eyes: Anicteric no pallor. ENMT: No discharge from the ears eyes nose or  mouth. Neck: No mass felt.  No JVD appreciated. Respiratory: No rhonchi or crepitations. Cardiovascular: S1-S2 heard no murmurs appreciated. Abdomen: Soft nontender bowel sounds present. Musculoskeletal: No edema. Skin: No rash.  Neurologic: Alert awake oriented to time place and person.  Moves all extremities. Psychiatric: Appears normal.  Normal affect.   Labs on Admission: I have personally reviewed following  labs and imaging studies  CBC: Recent Labs  Lab 02/24/17 2041  WBC 17.4*  NEUTROABS 14.7*  HGB 13.5  HCT 38.1*  MCV 89.0  PLT 846   Basic Metabolic Panel: Recent Labs  Lab 02/24/17 1333 02/24/17 2041  NA  --  132*  K  --  4.1  CL  --  95*  CO2  --  24  GLUCOSE  --  172*  BUN  --  22*  CREATININE 1.60* 1.68*  CALCIUM  --  9.0   GFR: Estimated Creatinine Clearance: 53.7 mL/min (A) (by C-G formula based on SCr of 1.68 mg/dL (H)). Liver Function Tests: No results for input(s): AST, ALT, ALKPHOS, BILITOT, PROT, ALBUMIN in the last 168 hours. No results for input(s): LIPASE, AMYLASE in the last 168 hours. No results for input(s): AMMONIA in the last 168 hours. Coagulation Profile: No results for input(s): INR, PROTIME in the last 168 hours. Cardiac Enzymes: No results for input(s): CKTOTAL, CKMB, CKMBINDEX, TROPONINI in the last 168 hours. BNP (last 3 results) No results for input(s): PROBNP in the last 8760 hours. HbA1C: No results for input(s): HGBA1C in the last 72 hours. CBG: No results for input(s): GLUCAP in the last 168 hours. Lipid Profile: No results for input(s): CHOL, HDL, LDLCALC, TRIG, CHOLHDL, LDLDIRECT in the last 72 hours. Thyroid Function Tests: No results for input(s): TSH, T4TOTAL, FREET4, T3FREE, THYROIDAB in the last 72 hours. Anemia Panel: No results for input(s): VITAMINB12, FOLATE, FERRITIN, TIBC, IRON, RETICCTPCT in the last 72 hours. Urine analysis:    Component Value Date/Time   COLORURINE YELLOW 06/10/2008 0151    APPEARANCEUR TURBID (A) 06/10/2008 0151   LABSPEC 1.014 06/10/2008 0151   PHURINE 6.0 06/10/2008 0151   GLUCOSEU NEGATIVE 06/10/2008 0151   HGBUR NEGATIVE 06/10/2008 0151   BILIRUBINUR NEGATIVE 06/10/2008 0151   KETONESUR NEGATIVE 06/10/2008 0151   PROTEINUR NEGATIVE 06/10/2008 0151   UROBILINOGEN 0.2 06/10/2008 0151   NITRITE NEGATIVE 06/10/2008 0151   LEUKOCYTESUR NEGATIVE 06/10/2008 0151   Sepsis Labs: @LABRCNTIP (procalcitonin:4,lacticidven:4) )No results found for this or any previous visit (from the past 240 hour(s)).   Radiological Exams on Admission: Ct Chest W Contrast  Result Date: 02/24/2017 CLINICAL DATA:  62 year old male with a history of pneumonia 02/21/2017 EXAM: CT CHEST WITH CONTRAST TECHNIQUE: Multidetector CT imaging of the chest was performed during intravenous contrast administration. CONTRAST:  68mL ISOVUE-300 IOPAMIDOL (ISOVUE-300) INJECTION 61% COMPARISON:  Chest x-ray 02/21/2017 FINDINGS: Cardiovascular: Heart size within normal limits. Calcifications of the left main, left anterior descending coronary arteries. Minimal calcifications of the aortic arch and the branch vessels, specifically left subclavian artery. No aneurysm.  No dissection. Pulmonary arteries not well evaluated given the timing of the contrast bolus. Mediastinum/Nodes: Multiple borderline enlarged lymph nodes throughout the mediastinum, including left hilar, subcarinal, paratracheal nodal stations. Index lymph node on the right in the right paratracheal nodal station measures 16 mm. Peribronchial lymph node on the left measures 14 mm. Unremarkable thoracic esophagus. Lungs/Pleura: Significant increase volume of left-sided pleural fluid, which is loculated within the left fissure, and at the left inferior pleural space with scalloping of the lung, compressive atelectasis, with the largest component in the subpulmonic region. Inflammatory change in thickening along the diaphragm anteriorly. Loculated fluid  extending towards the left hilum. Evidence of pleural enhancement at the lung base. There is trace right-sided pleural fluid, in the costophrenic sulcus. Pattern of centrilobular nodularity in predominantly peribronchovascular distribution, mostly of the right upper lobe. Upper Abdomen: No acute Musculoskeletal:  No acute displaced fracture. IMPRESSION: Developing left-sided empyema, with significant increase loculated pleural fluid compared to the prior chest x-ray, and associated pleural enhancement. Reactive mediastinal adenopathy. Mild centrilobular nodularity of predominantly the right upper lobe, compatible with infection Small right-sided pleural effusion. Two vessel coronary artery disease. These results were called by telephone at the time of interpretation on 02/24/2017 at 2:08 pm to Dr. Sinda Du , who verbally acknowledged these results. Electronically Signed   By: Corrie Mckusick D.O.   On: 02/24/2017 14:11     Assessment/Plan Principal Problem:   Empyema (HCC) Active Problems:   Chronic pain   Prostate cancer ([place])    1. Empyema -patient has been placed on vancomycin Zosyn and Levaquin.  Follow blood cultures patient will be kept n.p.o. past midnight in anticipation of surgical procedure.  Dr. Servando Snare, cardiothoracic surgeon was notified by patient's primary care physician. 2. Chronic pain on Duragesic patch.  Patient is presently n.p.o. 3. History of prostate cancer on Arimidex which can be restarted after surgery. 4. History of sleep apnea.  Patient does not use CPAP. 5. BPH on tamsulosin which can be restarted after surgery.   DVT prophylaxis: SCDs in anticipation of surgery. Code Status: Full code. Family Communication: Patient's wife. Disposition Plan: Home. Consults called: Cardiothoracic surgery. Admission status: Inpatient.   Rise Patience MD [facility] Pager [phone].  If 7PM-7AM, please contact night-coverage [email or web address] Password  [facility]  02/24/2017, 11:34 PM

## 2017-02-24 NOTE — Progress Notes (Signed)
Pharmacy Antibiotic Note  JSON KOELZER is a 62 y.o. male admitted on 02/24/2017 with pneumonia.  Pharmacy has been consulted for Vancocin, Zosyn, and Levaquin dosing.  Plan: Vancomycin 1500mg  x1 then 750mg  IV every 12 hours.  Goal trough 15-20 mcg/mL. Zosyn 3.375g IV q8h (4 hour infusion).  Levaquin 500mg  IV every 24 hours.  Height: 6\' 2"  (188 cm) Weight: 199 lb (90.3 kg) IBW/kg (Calculated) : 82.2  Temp (24hrs), Avg:98.7 F (37.1 C), Min:98.7 F (37.1 C), Max:98.7 F (37.1 C)  Recent Labs  Lab 02/24/17 1333 02/24/17 2041 02/24/17 2111  WBC  --  17.4*  --   CREATININE 1.60* 1.68*  --   LATICACIDVEN  --   --  1.31    Estimated Creatinine Clearance: 53.7 mL/min (A) (by C-G formula based on SCr of 1.68 mg/dL (H)).    No Known Allergies   Thank you for allowing pharmacy to be a part of this patient's care.  Wynona Neat, PharmD, BCPS  02/24/2017 11:37 PM

## 2017-02-24 NOTE — ED Provider Notes (Signed)
[facility] EMERGENCY DEPARTMENT Provider Note   CSN: [id number] Arrival date & time: 02/24/17  1845     History   Chief Complaint Chief Complaint  Patient presents with  . Shortness of Breath    HPI Kenneth [name] is a 62 y.o. male sent over to the emergency department today for pneumonia.  Patient notes that over the last 1 week he has been having fever, chills, very intermittent nonproductive cough and bilateral lower rib pain.  He notes this is similar to last year when he was diagnosed with pneumonia and had to stay in the hospital for 7 days at [facility].  The patient is followed by his PCP Dr. Luan Pulling where he received a chest x-ray x-ray on 02/21/2017 that showed chronic pleural and parenchymal scarring in the bases with superimposed pleural fluid and worsening basilar density, left more than right suggesting mild basilar pneumonia.  Patient notes that he was started on once daily 750 mg levofloxacin that he has been taking for the last 4 days.  He received a CT chest with contrast today that showed developing left-sided empyema with significant increased loculated pleural fluid compared to chest x-ray on 2/8.  There is also noted to be nodularity of the right upper lobe compatible with infection and small right-sided pleural effusion.  The patient's primary care provider contacted Dr. Servando Snare who recommended the patient undergo a VATS procedure for the empyema tomorrow.  He was sent over here today for admission.  Prior to leaving the office he did receive a dose of ceftriaxone.  Recent denies any current headache, nasal congestion, sore throat, neck stiffness, chest pain, shortness of breath, hemoptysis, abdominal pain, nausea/vomiting/diarrhea, or lower leg swelling. He is a former 30 pack year smoker.   HPI  Past Medical History:  Diagnosis Date  . Anxiety   . 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 Carotid artery occlusion    right  . Chronic pain disorder    left arm,  shoulder and chest ;thoracic syndrome  . Collapsed lung 2010  . Depression   . Fibromyalgia    "left shoulder area" (10/24/2011)  . GERD (gastroesophageal reflux disease)   . History of blood transfusion 2010  . History of thoracic outlet syndrome 2008   rib removed in 2009  . Insomnia due to medical condition   . Neuromuscular disorder ([place])   . Neuropathy of hand    left  . Pneumonia 2010  . PONV (postoperative nausea and vomiting)   . Prostate cancer ([place])   . 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 RSD (reflex sympathetic dystrophy)    "left shoulder and left arm" (10/24/2011)  . Snoring     Patient Active Problem List   Diagnosis Date Noted  . Obstructive sleep apnea 10/31/2015  . Insomnia 10/31/2015  . Cervical spondylosis with myelopathy 10/21/2011    Past Surgical History:  Procedure Laterality Date  . ANTERIOR CERVICAL DECOMP/DISCECTOMY FUSION  10/24/2011  . ANTERIOR CERVICAL DECOMP/DISCECTOMY FUSION  10/24/2011   Procedure: ANTERIOR CERVICAL DECOMPRESSION/DISCECTOMY FUSION 2 LEVELS;  Surgeon: Melina Schools, MD;  Location: [facility];  Service: Orthopedics;  Laterality: N/A;  ACDF C3-5 C-ARM SKYTRON TABLE SYNTHES VECTOR TITAN CAGE NEURO MONITORING SYTEM  . CYSTECTOMY     left hand x 3  . EYE SURGERY    . KNEE ARTHROSCOPY  1987   right  . REFRACTIVE SURGERY  2003   bilaterally  . RIB  RESECTION  2009   "left anterior" (10/24/2011)  . VASCULAR SURGERY     to remove  blood clots       Home Medications    Prior to Admission medications   Medication Sig Start Date End Date Taking? Authorizing Provider  ALPRAZolam Duanne Moron) 0.5 MG tablet Take 0.5 mg by mouth 2 (two) times daily.  11/18/14   [provider]  anastrozole (ARIMIDEX) 1 MG tablet Take 1 mg by mouth 3 (three) times a week.    [provider]  aspirin EC 81 MG tablet Take 81 mg by mouth daily.    [provider]  cetirizine (ZYRTEC) 10 MG tablet Take 10 mg by  mouth daily.    [provider]  Cinnamon 500 MG capsule Take 500 mg by mouth daily.     [provider]  Coenzyme Q10 200 MG capsule Take 200 mg by mouth daily.    [provider]  cyclobenzaprine (FLEXERIL) 5 MG tablet Take 5 mg by mouth 3 (three) times daily as needed for muscle spasms.    [provider]  fentaNYL (DURAGESIC - DOSED MCG/HR) 12 MCG/HR Place 12 mcg onto the skin every other day.     [provider]  Javier Docker Oil 500 MG CAPS Take 500 mg by mouth daily.     [provider]  mirtazapine (REMERON) 15 MG tablet Take 15 mg by mouth at bedtime.    [provider]  naproxen sodium (ANAPROX) 220 MG tablet Take 220 mg by mouth daily as needed (for pain or headache).     [provider]  polyethylene glycol (MIRALAX / GLYCOLAX) packet Take 17 g by mouth daily. Take 1 packet daily until bowels become regular Patient not taking: Reported on 02/24/2017 10/25/11   Carron Curie, PA-C  pregabalin (LYRICA) 75 MG capsule Take 75-150 mg by mouth See admin instructions. Take 150 mg by mouth in the morning and take 75 mg by mouth at supper    [provider]  Probiotic Product (PROBIOTIC DAILY PO) Take 1 capsule by mouth daily.     [provider]  RABEprazole (ACIPHEX) 20 MG tablet Take 20 mg by mouth daily.    [provider]  SUPER B COMPLEX/C PO Take 1 tablet by mouth daily.    [provider]  tamsulosin (FLOMAX) 0.4 MG CAPS capsule Take 0.4 mg by mouth daily.  01/16/15   [provider]  traZODone (DESYREL) 50 MG tablet Take 50 mg by mouth at bedtime.  10/22/14   [provider]    Family History Family History  Problem Relation Age of Onset  . Diabetes Mother     Social History Social History   Tobacco Use  . Smoking status: Former Smoker    Packs/day: 1.00    Years: 30.00    Pack years: 30.00    Types: Cigarettes    Last attempt to quit: 10/20/2004    Years  since quitting: 12.3  . Smokeless tobacco: Current User    Types: Snuff  Substance Use Topics  . Alcohol use: No    Alcohol/week: 0.0 oz  . Drug use: No     Allergies   Patient has no known allergies.   Review of Systems Review of Systems  All other systems reviewed and are negative.    Physical Exam Updated Vital Signs BP (!) 148/72   Pulse (!) 108   Temp 98.7 F (37.1 C) (Oral)   Resp Marland Kitchen)  26   Ht 6\' 2"  (1.88 m)   Wt 90.3 kg (199 lb)   SpO2 95%   BMI 25.55 kg/m   Physical Exam  Constitutional: He appears well-developed and well-nourished.  HENT:  Head: Normocephalic and atraumatic.  Right Ear: Tympanic membrane and external ear normal.  Left Ear: Tympanic membrane and external ear normal.  Nose: Nose normal.  Mouth/Throat: Uvula is midline, oropharynx is clear and moist and mucous membranes are normal. No tonsillar exudate.  The patient has normal phonation and is in control of secretions. No stridor.  Midline uvula without edema. Soft palate rises symmetrically.  No tonsillar erythema or exudates. No PTA. Tongue protrusion is normal. No trismus. No creptius on neck palpation and patient has good dentition. No gingival erythema or fluctuance noted. Mucus membranes moist.   Eyes: Pupils are equal, round, and reactive to light. Right eye exhibits no discharge. Left eye exhibits no discharge. No scleral icterus.  Neck: Trachea normal. Neck supple. No spinous process tenderness present. No neck rigidity. Normal range of motion present.  No nuchal rigidity or meningismus  Cardiovascular: Normal rate, regular rhythm and intact distal pulses.  No murmur heard. Pulses:      Radial pulses are 2+ on the right side, and 2+ on the left side.       Dorsalis pedis pulses are 2+ on the right side, and 2+ on the left side.       Posterior tibial pulses are 2+ on the right side, and 2+ on the left side.  No lower extremity swelling or edema. Calves symmetric in size bilaterally.    Pulmonary/Chest: Effort normal. He has decreased breath sounds in the left upper field, the left middle field and the left lower field. He exhibits no tenderness.  Crackles RUL No increased work of breathing. No accessory muscle use. Patient is sitting upright, speaking in full sentences without difficulty   Abdominal: Soft. Bowel sounds are normal. There is no tenderness. There is no rebound and no guarding.  Musculoskeletal: He exhibits no edema.  Lymphadenopathy:    He has no cervical adenopathy.  Neurological: He is alert.  Skin: Skin is warm and dry. No rash noted. He is not diaphoretic.  Psychiatric: He has a normal mood and affect.  Nursing note and vitals reviewed.    ED Treatments / Results  Labs (all labs ordered are listed, but only abnormal results are displayed) Labs Reviewed  CBC WITH DIFFERENTIAL/PLATELET - Abnormal; Notable for the following components:      Result Value   WBC 17.4 (*)    HCT 38.1 (*)    Neutro Abs 14.7 (*)    Monocytes Absolute 1.5 (*)    All other components within normal limits  BASIC METABOLIC PANEL - Abnormal; Notable for the following components:   Sodium 132 (*)    Chloride 95 (*)    Glucose, Bld 172 (*)    BUN 22 (*)    Creatinine, Ser 1.68 (*)    GFR calc non Af Amer 42 (*)    GFR calc Af Amer 49 (*)    All other components within normal limits  CULTURE, BLOOD (ROUTINE X 2)  CULTURE, BLOOD (ROUTINE X 2)  INFLUENZA PANEL BY PCR (TYPE A & B)  I-STAT CG4 LACTIC ACID, ED    EKG  EKG Interpretation  Date/Time:  Monday February 24 2017 18:51:34 EST Ventricular Rate:  113 PR Interval:  116 QRS Duration: 88 QT Interval:  324 QTC Calculation: 444  R Axis:   33 Text Interpretation:  Sinus tachycardia Minimal voltage criteria for LVH, may be normal variant Nonspecific T wave abnormality Confirmed by Blanchie Dessert [phone]) on 02/24/2017 9:00:32 PM       Radiology Ct Chest W Contrast  Result Date: 02/24/2017 CLINICAL DATA:   62 year old male with a history of pneumonia 02/21/2017 EXAM: CT CHEST WITH CONTRAST TECHNIQUE: Multidetector CT imaging of the chest was performed during intravenous contrast administration. CONTRAST:  49mL ISOVUE-300 IOPAMIDOL (ISOVUE-300) INJECTION 61% COMPARISON:  Chest x-ray 02/21/2017 FINDINGS: Cardiovascular: Heart size within normal limits. Calcifications of the left main, left anterior descending coronary arteries. Minimal calcifications of the aortic arch and the branch vessels, specifically left subclavian artery. No aneurysm.  No dissection. Pulmonary arteries not well evaluated given the timing of the contrast bolus. Mediastinum/Nodes: Multiple borderline enlarged lymph nodes throughout the mediastinum, including left hilar, subcarinal, paratracheal nodal stations. Index lymph node on the right in the right paratracheal nodal station measures 16 mm. Peribronchial lymph node on the left measures 14 mm. Unremarkable thoracic esophagus. Lungs/Pleura: Significant increase volume of left-sided pleural fluid, which is loculated within the left fissure, and at the left inferior pleural space with scalloping of the lung, compressive atelectasis, with the largest component in the subpulmonic region. Inflammatory change in thickening along the diaphragm anteriorly. Loculated fluid extending towards the left hilum. Evidence of pleural enhancement at the lung base. There is trace right-sided pleural fluid, in the costophrenic sulcus. Pattern of centrilobular nodularity in predominantly peribronchovascular distribution, mostly of the right upper lobe. Upper Abdomen: No acute Musculoskeletal: No acute displaced fracture. IMPRESSION: Developing left-sided empyema, with significant increase loculated pleural fluid compared to the prior chest x-ray, and associated pleural enhancement. Reactive mediastinal adenopathy. Mild centrilobular nodularity of predominantly the right upper lobe, compatible with infection Small  right-sided pleural effusion. Two vessel coronary artery disease. These results were called by telephone at the time of interpretation on 02/24/2017 at 2:08 pm to Dr. Sinda Du , who verbally acknowledged these results. Electronically Signed   By: Corrie Mckusick D.O.   On: 02/24/2017 14:11    Procedures Procedures (including critical care time)  Medications Ordered in ED Medications  sodium chloride 0.9 % bolus 1,000 mL (0 mLs Intravenous Stopped 02/24/17 2141)     Initial Impression / Assessment and Plan / ED Course  I have reviewed the triage vital signs and the nursing notes.  Pertinent labs & imaging results that were available during my care of the patient were reviewed by me and considered in my medical decision making (see chart for details).     61 y.o. male sent over to the emergency department today for bilateral pneumonia, with left sided empyema, right sided pleural effusion who is to undergo VATs procedure tomorrow by Dr. Fransisco Hertz. Patient notes 1 week history of fever,chills, occassional cough and severe bilateral lower rib pain. Was seen by PCP and had XCR on 2/8 that showed L>R bibasillar PNA. He was started on Levofloxacin and has been on 750mg  x 4 days. CT chest findings indicated PCP to contact surgeon and schedule surgery for tomorrow. He was given Ceftriaxone IM prior to arrival in the ED by his PCP.   On presentation the patient is without fever.  He is noted to be mildly tachycardic and tachypneic.  No hypoxia or hypotension.  He is sitting upright.  Will obtain blood work, blood cultures, lactic acid and flu test.   Patient with leukocytosis of 17.4 with absolute neutrophil shift of  14.7.  Appears mildly dehydrated with elevated creatinine and BUN.  Lactic acid within normal limits.  Labs otherwise reassuring.  Will consult hospitalist for admission. Dr. Hal Hope to admit the patient. Patient appears safe for admission.   Final Clinical Impressions(s) / ED  Diagnoses   Final diagnoses:  Empyema ([place])  Pneumonia of both lungs due to infectious organism, unspecified part of lung    ED Discharge Orders    None       Lorelle Gibbs 02/24/17 2221    Blanchie Dessert, MD 02/25/17 [phone]

## 2017-02-25 ENCOUNTER — Encounter (HOSPITAL_COMMUNITY)
Admission: EM | Disposition: A | Discharge status: Home / Self Care | Payer: Self-pay | Source: Home / Self Care | Attending: Internal Medicine

## 2017-02-25 ENCOUNTER — Inpatient Hospital Stay (HOSPITAL_COMMUNITY): Payer: Commercial Managed Care - PPO

## 2017-02-25 ENCOUNTER — Encounter (HOSPITAL_COMMUNITY): Payer: Self-pay | Admitting: Certified Registered Nurse Anesthetist

## 2017-02-25 ENCOUNTER — Inpatient Hospital Stay (HOSPITAL_COMMUNITY): Payer: Commercial Managed Care - PPO | Admitting: Certified Registered Nurse Anesthetist

## 2017-02-25 ENCOUNTER — Inpatient Hospital Stay (HOSPITAL_COMMUNITY)
Admission: RE | Admit: 2017-02-25 | Payer: Commercial Managed Care - PPO | Source: Ambulatory Visit | Admitting: Cardiothoracic Surgery

## 2017-02-25 DIAGNOSIS — J869 Pyothorax without fistula: Secondary | ICD-10-CM

## 2017-02-25 HISTORY — PX: EMPYEMA DRAINAGE: SHX5097

## 2017-02-25 HISTORY — PX: VIDEO ASSISTED THORACOSCOPY (VATS)/EMPYEMA: SHX6172

## 2017-02-25 HISTORY — PX: VIDEO BRONCHOSCOPY: SHX5072

## 2017-02-25 HISTORY — PX: BIOPSY: SHX5522

## 2017-02-25 HISTORY — PX: DECORTICATION: SHX5101

## 2017-02-25 LAB — BLOOD GAS, ARTERIAL
Acid-Base Excess: 2.1 mmol/L — ABNORMAL HIGH (ref 0.0–2.0)
Bicarbonate: 25.5 mmol/L (ref 20.0–28.0)
Drawn by: 42180
O2 Content: 3 L/min
O2 Saturation: 97.9 %
Patient temperature: 98.6
pCO2 arterial: 35.5 mmHg (ref 32.0–48.0)
pH, Arterial: 7.471 — ABNORMAL HIGH (ref 7.350–7.450)
pO2, Arterial: 99.9 mmHg (ref 83.0–108.0)

## 2017-02-25 LAB — BASIC METABOLIC PANEL
Anion gap: 12 (ref 5–15)
BUN: 17 mg/dL (ref 6–20)
CALCIUM: 8.6 mg/dL — AB (ref 8.9–10.3)
CHLORIDE: 101 mmol/L (ref 101–111)
CO2: 23 mmol/L (ref 22–32)
CREATININE: 1.5 mg/dL — AB (ref 0.61–1.24)
GFR, EST AFRICAN AMERICAN: 56 mL/min — AB (ref >60)
GFR, EST NON AFRICAN AMERICAN: 49 mL/min — AB (ref >60)
Glucose, Bld: 128 mg/dL — ABNORMAL HIGH (ref 65–99)
Potassium: 3.8 mmol/L (ref 3.5–5.1)
SODIUM: 136 mmol/L (ref 135–145)

## 2017-02-25 LAB — BODY FLUID CELL COUNT WITH DIFFERENTIAL
Eos, Fluid: 1 %
Lymphs, Fluid: 3 %
Monocyte-Macrophage-Serous Fluid: 2 % — ABNORMAL LOW (ref 50–90)
Neutrophil Count, Fluid: 94 % — ABNORMAL HIGH (ref 0–25)
Total Nucleated Cell Count, Fluid: 430 cu mm (ref 0–1000)

## 2017-02-25 LAB — LACTATE DEHYDROGENASE, PLEURAL OR PERITONEAL FLUID: LD FL: 1385 U/L — AB (ref 3–23)

## 2017-02-25 LAB — CBC
HCT: 31 % — ABNORMAL LOW (ref 39.0–52.0)
HCT: 32.1 % — ABNORMAL LOW (ref 39.0–52.0)
Hemoglobin: 10.7 g/dL — ABNORMAL LOW (ref 13.0–17.0)
Hemoglobin: 11 g/dL — ABNORMAL LOW (ref 13.0–17.0)
MCH: 30.7 pg (ref 26.0–34.0)
MCH: 30.6 pg (ref 26.0–34.0)
MCHC: 34.5 g/dL (ref 30.0–36.0)
MCHC: 34.3 g/dL (ref 30.0–36.0)
MCV: 89.1 fL (ref 78.0–100.0)
MCV: 89.2 fL (ref 78.0–100.0)
PLATELETS: 179 10*3/uL (ref 150–400)
Platelets: 166 10*3/uL (ref 150–400)
RBC: 3.48 MIL/uL — AB (ref 4.22–5.81)
RBC: 3.6 MIL/uL — ABNORMAL LOW (ref 4.22–5.81)
RDW: 12.9 % (ref 11.5–15.5)
RDW: 12.9 % (ref 11.5–15.5)
WBC: 12.9 10*3/uL — AB (ref 4.0–10.5)
WBC: 13.7 10*3/uL — ABNORMAL HIGH (ref 4.0–10.5)

## 2017-02-25 LAB — HIV ANTIBODY (ROUTINE TESTING W REFLEX): HIV SCREEN 4TH GENERATION: NONREACTIVE

## 2017-02-25 LAB — COMPREHENSIVE METABOLIC PANEL
ALT: 16 U/L — ABNORMAL LOW (ref 17–63)
AST: 14 U/L — ABNORMAL LOW (ref 15–41)
Albumin: 2.5 g/dL — ABNORMAL LOW (ref 3.5–5.0)
Alkaline Phosphatase: 90 U/L (ref 38–126)
Anion gap: 12 (ref 5–15)
BUN: 16 mg/dL (ref 6–20)
CO2: 22 mmol/L (ref 22–32)
Calcium: 8.7 mg/dL — ABNORMAL LOW (ref 8.9–10.3)
Chloride: 103 mmol/L (ref 101–111)
Creatinine, Ser: 1.57 mg/dL — ABNORMAL HIGH (ref 0.61–1.24)
GFR calc Af Amer: 53 mL/min — ABNORMAL LOW (ref >60)
GFR calc non Af Amer: 46 mL/min — ABNORMAL LOW (ref >60)
Glucose, Bld: 132 mg/dL — ABNORMAL HIGH (ref 65–99)
Potassium: 4 mmol/L (ref 3.5–5.1)
Sodium: 137 mmol/L (ref 135–145)
Total Bilirubin: 0.9 mg/dL (ref 0.3–1.2)
Total Protein: 6.4 g/dL — ABNORMAL LOW (ref 6.5–8.1)

## 2017-02-25 LAB — APTT: aPTT: 45 seconds — ABNORMAL HIGH (ref 24–36)

## 2017-02-25 LAB — GLUCOSE, PLEURAL OR PERITONEAL FLUID: Glucose, Fluid: 28 mg/dL

## 2017-02-25 LAB — TYPE AND SCREEN
ABO/RH(D): A POS
Antibody Screen: NEGATIVE

## 2017-02-25 LAB — GLUCOSE, CAPILLARY
GLUCOSE-CAPILLARY: 151 mg/dL — AB (ref 65–99)
Glucose-Capillary: 142 mg/dL — ABNORMAL HIGH (ref 65–99)
Glucose-Capillary: 171 mg/dL — ABNORMAL HIGH (ref 65–99)

## 2017-02-25 LAB — SURGICAL PCR SCREEN
MRSA, PCR: NEGATIVE
Staphylococcus aureus: NEGATIVE

## 2017-02-25 LAB — PROTEIN, PLEURAL OR PERITONEAL FLUID: TOTAL PROTEIN, FLUID: 4.3 g/dL

## 2017-02-25 LAB — PROTIME-INR
INR: 1.36
Prothrombin Time: 16.7 seconds — ABNORMAL HIGH (ref 11.4–15.2)

## 2017-02-25 LAB — STREP PNEUMONIAE URINARY ANTIGEN: STREP PNEUMO URINARY ANTIGEN: NEGATIVE

## 2017-02-25 SURGERY — VIDEO ASSISTED THORACOSCOPY (VATS)/EMPYEMA
Anesthesia: General | Site: Chest

## 2017-02-25 MED ORDER — SODIUM CHLORIDE 0.9% FLUSH
10.0000 mL | Freq: Two times a day (BID) | INTRAVENOUS | Status: DC
  Administered 2017-02-25 – 2017-02-26 (×3): 10 mL
  Administered 2017-02-27: 40 mL
  Administered 2017-02-27: 10 mL

## 2017-02-25 MED ORDER — TAMSULOSIN HCL 0.4 MG PO CAPS: 0.4000 mg | ORAL_CAPSULE | Freq: Every day | ORAL | Status: DC

## 2017-02-25 MED ORDER — CHLORHEXIDINE GLUCONATE CLOTH 2 % EX PADS
6.0000 | MEDICATED_PAD | Freq: Every day | CUTANEOUS | Status: DC
  Administered 2017-02-25 – 2017-02-28 (×4): 6 via TOPICAL

## 2017-02-25 MED ORDER — ONDANSETRON HCL 4 MG/2ML IJ SOLN
INTRAMUSCULAR | Status: DC | PRN
  Administered 2017-02-25: 4 mg via INTRAVENOUS

## 2017-02-25 MED ORDER — OXYCODONE HCL 5 MG PO TABS
5.0000 mg | ORAL_TABLET | ORAL | Status: DC | PRN
  Administered 2017-02-25 – 2017-03-01 (×5): 10 mg via ORAL
  Filled 2017-02-25 (×5): qty 2

## 2017-02-25 MED ORDER — ANASTROZOLE 1 MG PO TABS: 1.0000 mg | ORAL_TABLET | ORAL | Status: DC

## 2017-02-25 MED ORDER — ACETAMINOPHEN 500 MG PO TABS
1000.0000 mg | ORAL_TABLET | Freq: Four times a day (QID) | ORAL | Status: DC
  Administered 2017-02-25 – 2017-02-28 (×11): 1000 mg via ORAL
  Filled 2017-02-25 (×13): qty 2

## 2017-02-25 MED ORDER — PROPOFOL 10 MG/ML IV BOLUS
INTRAVENOUS | Status: AC
  Filled 2017-02-25: qty 20

## 2017-02-25 MED ORDER — FENTANYL CITRATE (PF) 100 MCG/2ML IJ SOLN: 25.0000 ug | INTRAMUSCULAR | Status: DC | PRN

## 2017-02-25 MED ORDER — SODIUM CHLORIDE 0.9% FLUSH: 10.0000 mL | INTRAVENOUS | Status: DC | PRN

## 2017-02-25 MED ORDER — FENTANYL CITRATE (PF) 250 MCG/5ML IJ SOLN
INTRAMUSCULAR | Status: AC
  Filled 2017-02-25: qty 5

## 2017-02-25 MED ORDER — SENNOSIDES-DOCUSATE SODIUM 8.6-50 MG PO TABS
1.0000 | ORAL_TABLET | Freq: Every day | ORAL | Status: DC
  Administered 2017-02-26: 1 via ORAL
  Filled 2017-02-25 (×4): qty 1

## 2017-02-25 MED ORDER — ROCURONIUM BROMIDE 10 MG/ML (PF) SYRINGE
PREFILLED_SYRINGE | INTRAVENOUS | Status: AC
  Filled 2017-02-25: qty 15

## 2017-02-25 MED ORDER — DIPHENHYDRAMINE HCL 12.5 MG/5ML PO ELIX
12.5000 mg | ORAL_SOLUTION | Freq: Four times a day (QID) | ORAL | Status: DC | PRN
  Filled 2017-02-25: qty 5

## 2017-02-25 MED ORDER — MIDAZOLAM HCL 2 MG/2ML IJ SOLN
INTRAMUSCULAR | Status: AC
  Administered 2017-02-25: 2 mg
  Filled 2017-02-25: qty 2

## 2017-02-25 MED ORDER — SODIUM CHLORIDE 0.9% FLUSH: 9.0000 mL | INTRAVENOUS | Status: DC | PRN

## 2017-02-25 MED ORDER — ONDANSETRON HCL 4 MG/2ML IJ SOLN: 4.0000 mg | Freq: Four times a day (QID) | INTRAMUSCULAR | Status: DC | PRN

## 2017-02-25 MED ORDER — SUGAMMADEX SODIUM 200 MG/2ML IV SOLN
INTRAVENOUS | Status: DC | PRN
  Administered 2017-02-25: 200 mg via INTRAVENOUS

## 2017-02-25 MED ORDER — BISACODYL 5 MG PO TBEC
10.0000 mg | DELAYED_RELEASE_TABLET | Freq: Every day | ORAL | Status: DC
  Administered 2017-02-26: 10 mg via ORAL
  Filled 2017-02-25 (×3): qty 2

## 2017-02-25 MED ORDER — TAMSULOSIN HCL 0.4 MG PO CAPS
0.4000 mg | ORAL_CAPSULE | Freq: Every day | ORAL | Status: DC
  Administered 2017-02-26 – 2017-03-01 (×3): 0.4 mg via ORAL
  Filled 2017-02-25 (×3): qty 1

## 2017-02-25 MED ORDER — NALOXONE HCL 0.4 MG/ML IJ SOLN: 0.4000 mg | INTRAMUSCULAR | Status: DC | PRN

## 2017-02-25 MED ORDER — ACETAMINOPHEN 160 MG/5ML PO SOLN: 1000.0000 mg | Freq: Four times a day (QID) | ORAL | Status: DC

## 2017-02-25 MED ORDER — KETAMINE HCL 10 MG/ML IJ SOLN
INTRAMUSCULAR | Status: DC | PRN
  Administered 2017-02-25: 50 mg via INTRAVENOUS
  Administered 2017-02-25: 10 mg via INTRAVENOUS

## 2017-02-25 MED ORDER — FENTANYL CITRATE (PF) 100 MCG/2ML IJ SOLN
INTRAMUSCULAR | Status: DC | PRN
  Administered 2017-02-25 (×7): 50 ug via INTRAVENOUS

## 2017-02-25 MED ORDER — FENTANYL 40 MCG/ML IV SOLN
INTRAVENOUS | Status: DC
  Administered 2017-02-25: 1000 ug via INTRAVENOUS
  Administered 2017-02-25: 135 ug via INTRAVENOUS
  Administered 2017-02-26: 60 ug via INTRAVENOUS
  Administered 2017-02-26: 75 ug via INTRAVENOUS
  Administered 2017-02-26: 185 ug via INTRAVENOUS
  Administered 2017-02-26: 90 ug via INTRAVENOUS
  Administered 2017-02-26: 120 ug via INTRAVENOUS
  Administered 2017-02-26: 1000 ug via INTRAVENOUS
  Administered 2017-02-26: 165 ug via INTRAVENOUS
  Administered 2017-02-27: 60 ug via INTRAVENOUS
  Administered 2017-02-27: 90 ug via INTRAVENOUS
  Administered 2017-02-27: 60 ug via INTRAVENOUS
  Administered 2017-02-27: 105 ug via INTRAVENOUS
  Filled 2017-02-25 (×3): qty 25

## 2017-02-25 MED ORDER — ORAL CARE MOUTH RINSE
15.0000 mL | Freq: Two times a day (BID) | OROMUCOSAL | Status: DC
  Administered 2017-02-25: 15 mL via OROMUCOSAL

## 2017-02-25 MED ORDER — METOPROLOL TARTRATE 12.5 MG HALF TABLET
12.5000 mg | ORAL_TABLET | Freq: Two times a day (BID) | ORAL | Status: DC
  Administered 2017-02-25 – 2017-02-27 (×5): 12.5 mg via ORAL
  Filled 2017-02-25 (×5): qty 1

## 2017-02-25 MED ORDER — SUGAMMADEX SODIUM 200 MG/2ML IV SOLN
INTRAVENOUS | Status: AC
  Filled 2017-02-25: qty 2

## 2017-02-25 MED ORDER — ENOXAPARIN SODIUM 40 MG/0.4ML ~~LOC~~ SOLN
40.0000 mg | Freq: Every day | SUBCUTANEOUS | Status: DC
  Administered 2017-02-26 – 2017-02-28 (×3): 40 mg via SUBCUTANEOUS
  Filled 2017-02-25 (×4): qty 0.4

## 2017-02-25 MED ORDER — ALPRAZOLAM 0.5 MG PO TABS
0.5000 mg | ORAL_TABLET | Freq: Two times a day (BID) | ORAL | Status: DC
  Administered 2017-02-26 (×2): 0.5 mg via ORAL
  Filled 2017-02-25 (×2): qty 1

## 2017-02-25 MED ORDER — POTASSIUM CHLORIDE 10 MEQ/50ML IV SOLN
10.0000 meq | Freq: Every day | INTRAVENOUS | Status: DC | PRN
  Filled 2017-02-25: qty 50

## 2017-02-25 MED ORDER — PROPOFOL 10 MG/ML IV BOLUS
INTRAVENOUS | Status: DC | PRN
  Administered 2017-02-25: 50 mg via INTRAVENOUS
  Administered 2017-02-25: 150 mg via INTRAVENOUS
  Administered 2017-02-25: 50 mg via INTRAVENOUS

## 2017-02-25 MED ORDER — LACTATED RINGERS IV SOLN
INTRAVENOUS | Status: DC | PRN
  Administered 2017-02-25: 11:00:00 via INTRAVENOUS

## 2017-02-25 MED ORDER — DEXTROSE-NACL 5-0.45 % IV SOLN
INTRAVENOUS | Status: DC
Start: 2017-02-25 — End: 2017-02-26
  Administered 2017-02-25 – 2017-02-26 (×2): via INTRAVENOUS

## 2017-02-25 MED ORDER — TRAZODONE HCL 50 MG PO TABS: 50.0000 mg | ORAL_TABLET | Freq: Every day | ORAL | Status: DC

## 2017-02-25 MED ORDER — DIPHENHYDRAMINE HCL 50 MG/ML IJ SOLN: 12.5000 mg | Freq: Four times a day (QID) | INTRAMUSCULAR | Status: DC | PRN

## 2017-02-25 MED ORDER — DEXMEDETOMIDINE HCL 200 MCG/2ML IV SOLN
INTRAVENOUS | Status: DC | PRN
  Administered 2017-02-25: 12 ug via INTRAVENOUS

## 2017-02-25 MED ORDER — HYDROMORPHONE HCL 1 MG/ML IJ SOLN: 0.2500 mg | INTRAMUSCULAR | Status: DC | PRN

## 2017-02-25 MED ORDER — INSULIN ASPART 100 UNIT/ML ~~LOC~~ SOLN
0.0000 [IU] | SUBCUTANEOUS | Status: DC
  Administered 2017-02-25: 4 [IU] via SUBCUTANEOUS
  Administered 2017-02-26: 8 [IU] via SUBCUTANEOUS
  Administered 2017-02-26: 4 [IU] via SUBCUTANEOUS
  Administered 2017-02-26 (×2): 2 [IU] via SUBCUTANEOUS

## 2017-02-25 MED ORDER — ROCURONIUM BROMIDE 100 MG/10ML IV SOLN
INTRAVENOUS | Status: DC | PRN
  Administered 2017-02-25 (×2): 20 mg via INTRAVENOUS
  Administered 2017-02-25: 30 mg via INTRAVENOUS
  Administered 2017-02-25: 50 mg via INTRAVENOUS

## 2017-02-25 MED ORDER — ANASTROZOLE 1 MG PO TABS
1.0000 mg | ORAL_TABLET | ORAL | Status: DC
  Filled 2017-02-25: qty 1

## 2017-02-25 MED ORDER — DEXAMETHASONE SODIUM PHOSPHATE 10 MG/ML IJ SOLN
INTRAMUSCULAR | Status: DC | PRN
  Administered 2017-02-25: 10 mg via INTRAVENOUS

## 2017-02-25 MED ORDER — FENTANYL CITRATE (PF) 100 MCG/2ML IJ SOLN
INTRAMUSCULAR | Status: AC
  Administered 2017-02-25: 50 ug
  Filled 2017-02-25: qty 2

## 2017-02-25 MED ORDER — 0.9 % SODIUM CHLORIDE (POUR BTL) OPTIME
TOPICAL | Status: DC | PRN
  Administered 2017-02-25: 1000 mL

## 2017-02-25 MED ORDER — ALPRAZOLAM 0.5 MG PO TABS: 0.5000 mg | ORAL_TABLET | Freq: Two times a day (BID) | ORAL | Status: DC

## 2017-02-25 MED ORDER — CYCLOBENZAPRINE HCL 10 MG PO TABS: 5.0000 mg | ORAL_TABLET | Freq: Three times a day (TID) | ORAL | Status: DC | PRN

## 2017-02-25 MED ORDER — FENTANYL CITRATE (PF) 250 MCG/5ML IJ SOLN
INTRAMUSCULAR | Status: AC
Start: 2017-02-25 — End: ?
  Filled 2017-02-25: qty 5

## 2017-02-25 MED ORDER — TRAMADOL HCL 50 MG PO TABS
50.0000 mg | ORAL_TABLET | Freq: Four times a day (QID) | ORAL | Status: DC | PRN
  Administered 2017-02-25: 100 mg via ORAL
  Administered 2017-02-26: 50 mg via ORAL
  Administered 2017-02-26 – 2017-03-01 (×9): 100 mg via ORAL
  Filled 2017-02-25 (×9): qty 2
  Filled 2017-02-25: qty 1
  Filled 2017-02-25: qty 2

## 2017-02-25 MED ORDER — KETAMINE HCL-SODIUM CHLORIDE 100-0.9 MG/10ML-% IV SOSY
PREFILLED_SYRINGE | INTRAVENOUS | Status: AC
  Filled 2017-02-25: qty 10

## 2017-02-25 SURGICAL SUPPLY — 82 items
ADH SKN CLS APL DERMABOND .7 (GAUZE/BANDAGES/DRESSINGS) ×3
APL SRG 22X2 LUM MLBL SLNT (VASCULAR PRODUCTS)
APL SRG 7X2 LUM MLBL SLNT (VASCULAR PRODUCTS)
APPLICATOR TIP COSEAL (VASCULAR PRODUCTS) IMPLANT
APPLICATOR TIP EXT COSEAL (VASCULAR PRODUCTS) IMPLANT
BLADE SURG 11 STRL SS (BLADE) ×4 IMPLANT
CANISTER SUCT 3000ML PPV (MISCELLANEOUS) ×4 IMPLANT
CATH KIT ON Q 5IN SLV (PAIN MANAGEMENT) IMPLANT
CATH THORACIC 28FR (CATHETERS) IMPLANT
CATH THORACIC 36FR (CATHETERS) IMPLANT
CATH THORACIC 36FR RT ANG (CATHETERS) IMPLANT
CLEANER TIP ELECTROSURG 2X2 (MISCELLANEOUS) ×4 IMPLANT
CLIP VESOCCLUDE MED 6/CT (CLIP) IMPLANT
CONN ST 1/4X3/8  BEN (MISCELLANEOUS) ×2
CONN ST 1/4X3/8 BEN (MISCELLANEOUS) IMPLANT
CONN Y 3/8X3/8X3/8  BEN (MISCELLANEOUS)
CONN Y 3/8X3/8X3/8 BEN (MISCELLANEOUS) IMPLANT
CONT SPEC 4OZ CLIKSEAL STRL BL (MISCELLANEOUS) ×9 IMPLANT
COVER SURGICAL LIGHT HANDLE (MISCELLANEOUS) ×4 IMPLANT
DERMABOND ADVANCED (GAUZE/BANDAGES/DRESSINGS) ×1
DERMABOND ADVANCED .7 DNX12 (GAUZE/BANDAGES/DRESSINGS) IMPLANT
DRAIN CHANNEL 28F RND 3/8 FF (WOUND CARE) IMPLANT
DRAIN CHANNEL 32F RND 10.7 FF (WOUND CARE) ×2 IMPLANT
DRAPE LAPAROSCOPIC ABDOMINAL (DRAPES) ×4 IMPLANT
DRAPE WARM FLUID 44X44 (DRAPE) ×4 IMPLANT
DRILL BIT 7/64X5 (BIT) IMPLANT
ELECT BLADE 4.0 EZ CLEAN MEGAD (MISCELLANEOUS) ×8
ELECT REM PT RETURN 9FT ADLT (ELECTROSURGICAL) ×4
ELECTRODE BLDE 4.0 EZ CLN MEGD (MISCELLANEOUS) ×3 IMPLANT
ELECTRODE REM PT RTRN 9FT ADLT (ELECTROSURGICAL) ×3 IMPLANT
GAUZE SPONGE 4X4 12PLY STRL (GAUZE/BANDAGES/DRESSINGS) ×4 IMPLANT
GAUZE SPONGE 4X4 12PLY STRL LF (GAUZE/BANDAGES/DRESSINGS) ×2 IMPLANT
GLOVE BIO SURGEON STRL SZ 6.5 (GLOVE) ×14 IMPLANT
GLOVE BIOGEL PI IND STRL 6.5 (GLOVE) IMPLANT
GLOVE BIOGEL PI INDICATOR 6.5 (GLOVE) ×5
GLOVE SURG SS PI 6.0 STRL IVOR (GLOVE) ×2 IMPLANT
GOWN STRL REUS W/ TWL LRG LVL3 (GOWN DISPOSABLE) ×9 IMPLANT
GOWN STRL REUS W/TWL LRG LVL3 (GOWN DISPOSABLE) ×12
KIT BASIN OR (CUSTOM PROCEDURE TRAY) ×4 IMPLANT
KIT ROOM TURNOVER OR (KITS) ×4 IMPLANT
KIT SUCTION CATH 14FR (SUCTIONS) ×4 IMPLANT
NS IRRIG 1000ML POUR BTL (IV SOLUTION) ×16 IMPLANT
PACK CHEST (CUSTOM PROCEDURE TRAY) ×4 IMPLANT
PAD ARMBOARD 7.5X6 YLW CONV (MISCELLANEOUS) ×8 IMPLANT
PASSER SUT SWANSON 36MM LOOP (INSTRUMENTS) IMPLANT
SCISSORS LAP 5X35 DISP (ENDOMECHANICALS) IMPLANT
SEALANT PROGEL (MISCELLANEOUS) IMPLANT
SEALANT SURG COSEAL 4ML (VASCULAR PRODUCTS) IMPLANT
SEALANT SURG COSEAL 8ML (VASCULAR PRODUCTS) IMPLANT
SOLUTION ANTI FOG 6CC (MISCELLANEOUS) ×4 IMPLANT
SUT PROLENE 3 0 SH DA (SUTURE) IMPLANT
SUT PROLENE 4 0 RB 1 (SUTURE)
SUT PROLENE 4-0 RB1 .5 CRCL 36 (SUTURE) IMPLANT
SUT SILK  1 MH (SUTURE) ×4
SUT SILK 1 MH (SUTURE) ×12 IMPLANT
SUT SILK 1 TIES 10X30 (SUTURE) IMPLANT
SUT SILK 2 0SH CR/8 30 (SUTURE) IMPLANT
SUT SILK 3 0SH CR/8 30 (SUTURE) IMPLANT
SUT VIC AB 1 CTX 18 (SUTURE) ×1 IMPLANT
SUT VIC AB 1 CTX 36 (SUTURE)
SUT VIC AB 1 CTX36XBRD ANBCTR (SUTURE) IMPLANT
SUT VIC AB 2-0 CT1 27 (SUTURE) ×4
SUT VIC AB 2-0 CT1 TAPERPNT 27 (SUTURE) IMPLANT
SUT VIC AB 2-0 CTX 36 (SUTURE) IMPLANT
SUT VIC AB 3-0 SH 8-18 (SUTURE) ×1 IMPLANT
SUT VIC AB 3-0 X1 27 (SUTURE) ×1 IMPLANT
SUT VICRYL 0 UR6 27IN ABS (SUTURE) IMPLANT
SUT VICRYL 2 TP 1 (SUTURE) IMPLANT
SWAB COLLECTION DEVICE MRSA (MISCELLANEOUS) IMPLANT
SWAB CULTURE ESWAB REG 1ML (MISCELLANEOUS) IMPLANT
SYSTEM SAHARA CHEST DRAIN ATS (WOUND CARE) ×4 IMPLANT
TAPE CLOTH 4X10 WHT NS (GAUZE/BANDAGES/DRESSINGS) ×4 IMPLANT
TAPE CLOTH SURG 4X10 WHT LF (GAUZE/BANDAGES/DRESSINGS) ×2 IMPLANT
TAPE UMBILICAL COTTON 1/8X30 (MISCELLANEOUS) ×4 IMPLANT
TIP APPLICATOR SPRAY EXTEND 16 (VASCULAR PRODUCTS) IMPLANT
TOWEL GREEN STERILE (TOWEL DISPOSABLE) ×4 IMPLANT
TOWEL GREEN STERILE FF (TOWEL DISPOSABLE) ×4 IMPLANT
TRAP SPECIMEN MUCOUS 40CC (MISCELLANEOUS) IMPLANT
TRAY FOLEY CATH SILVER 16FR LF (SET/KITS/TRAYS/PACK) ×4 IMPLANT
TROCAR BLADELESS 12MM (ENDOMECHANICALS) ×4 IMPLANT
TUNNELER SHEATH ON-Q 11GX8 DSP (PAIN MANAGEMENT) IMPLANT
WATER STERILE IRR 1000ML POUR (IV SOLUTION) ×8 IMPLANT

## 2017-02-25 NOTE — Brief Op Note (Addendum)
02/24/2017 - 02/25/2017  7:56 PM  PATIENT:  Kenneth [name]  62 y.o. male  PRE-OPERATIVE DIAGNOSIS:  EMPYEMA, left  POST-OPERATIVE DIAGNOSIS:  EMPYEMA, left  PROCEDURE:  Procedure(s): LEFT VIDEO ASSISTED THORACOSCOPY (VATS) (Left) VIDEO BRONCHOSCOPY (N/A) DECORTICATION (Left) BIOPSY DIAPHRAGMATIC PLAQUE (N/A) LEFT EMPYEMA DRAINAGE (Left)  SURGEON:  Surgeon(s) and Role:    * Grace Isaac, MD - Primary  PHYSICIAN ASSISTANT:  Nicholes Rough, PA-C   ANESTHESIA:   general  EBL:  50 mL   BLOOD ADMINISTERED:none  DRAINS: TWO BLAKE DRAINS   LOCAL MEDICATIONS USED:  NONE  SPECIMEN:  Source of Specimen:  LEFT PLEURAL PEEL, DIAPHRAGMATIC BIOPSY  DISPOSITION OF SPECIMEN:  PATHOLOGY  COUNTS:  YES  DICTATION: .Dragon Dictation  PLAN OF CARE: Admit for overnight observation  PATIENT DISPOSITION:  ICU - intubated and hemodynamically stable.   Delay start of Pharmacological VTE agent (>24hrs) due to surgical blood loss or risk of bleeding: yes

## 2017-02-25 NOTE — Progress Notes (Addendum)
PROGRESS NOTE    Kenneth [name]  ENI:[id number] DOB: 03-May-1955 DOA: 02/24/2017 PCP: Sinda Du, MD     Brief Narrative:  Kenneth [name] is a 62 y.o. male with history of prostate cancer, chronic pain with history of thoracic outlet syndrome with previous history of left upper extremity DVT, sleep apnea who has been experiencing sharp left-sided chest pain last week.  Pain is mostly pleuritic in nature.  Patient's pain worsened and called up his PCP on 2/8 and was called in [place] which patient took last 4 days.  He followed up with primary care physician on 2/11 and had chest x-ray followed by CT chest which showed features concerning for empyema and was told to come to the ER.  Primary care has discussed with cardiothoracic surgeon Dr. Servando Snare who will be planning to do VATS. Patient has had a similar episode in 2010 on the right side and had a VATS done at that time.   Assessment & Plan:   Principal Problem:   Empyema ([place]) Active Problems:   Chronic pain   Prostate cancer ([place])   Sepsis secondary to left empyema -Presented with fever 102.3, tachycardia, tachypnea, and leukocytosis 17.4. Lactic acid 1.31.  -Planned for VATS this morning -Continue empiric vanco/zosyn/levaquin  Chronic pain syndrome -Continue home fentanyl patch  Hx of prostate cancer -Continue arimidex  BPH -Continue tamsulosin   OSA -Does not use CPAP   DVT prophylaxis: SCD, may start lovenox post-op if okay with cardiothoracic surgery Code Status: Full Family Communication: No family at bedside Disposition Plan: OR today    Consultants:   Cardiothoracic surgery  Procedures:   None   Antimicrobials:  Anti-infectives (From admission, onward)   Start     Dose/Rate Route Frequency Ordered Stop   02/25/17 1000  [MAR Hold]  vancomycin (VANCOCIN) IVPB 750 mg/150 ml premix     (MAR Hold since 02/25/17 0930)   750 mg 150 mL/hr over 60 Minutes Intravenous Every 12 hours 02/24/17 2340     02/25/17 0600  [MAR Hold]  piperacillin-tazobactam (ZOSYN) IVPB 3.375 g     (MAR Hold since 02/25/17 0930)   3.375 g 12.5 mL/hr over 240 Minutes Intravenous Every 8 hours 02/24/17 2340     02/25/17 0000  [MAR Hold]  levofloxacin (LEVAQUIN) IVPB 500 mg     (MAR Hold since 02/25/17 0930)   500 mg 100 mL/hr over 60 Minutes Intravenous Every 24 hours 02/24/17 2340     02/24/17 2345  vancomycin (VANCOCIN) 1,500 mg in sodium chloride 0.9 % 500 mL IVPB     1,500 mg 250 mL/hr over 120 Minutes Intravenous  Once 02/24/17 2340 02/25/17 0228   02/24/17 2345  piperacillin-tazobactam (ZOSYN) IVPB 3.375 g     3.375 g 100 mL/hr over 30 Minutes Intravenous  Once 02/24/17 2340 02/25/17 0219       Subjective: Feeling well this morning. Continues to have left sided sharp chest pain which has improved since last week. No SOB or cough. Has had low grade fevers at home. No N/V.   Objective: Vitals:   02/25/17 0400 02/25/17 0500 02/25/17 0600 02/25/17 0800  BP: (!) 154/63 (!) 145/65 (!) 143/64 (!) 163/63  Pulse:    97  Resp: 18 18 18 17   Temp:    98.6 F (37 C)  TempSrc:    Oral  SpO2: 92% 95% 98% 93%  Weight:      Height:        Intake/Output Summary (Last 24 hours) at  02/25/2017 0931 Last data filed at 02/25/2017 0700 Gross per 24 hour  Intake 1150 ml  Output -  Net 1150 ml   Filed Weights   02/24/17 1856 02/25/17 0112  Weight: 90.3 kg (199 lb) 93 kg (205 lb 0.4 oz)    Examination:  General exam: Appears calm and comfortable  Respiratory system: Clear to auscultation on right, diminished breath sound on LLL. Respiratory effort normal. On room air without conversational dyspnea  Cardiovascular system: S1 & S2 heard, RRR. No JVD, murmurs, rubs, gallops or clicks. No pedal edema. Gastrointestinal system: Abdomen is nondistended, soft and nontender. No organomegaly or masses felt. Normal bowel sounds heard. Central nervous system: Alert and oriented. No focal neurological  deficits. Extremities: Symmetric  Skin: No rashes, lesions or ulcers Psychiatry: Judgement and insight appear normal. Mood & affect appropriate.   Data Reviewed: I have personally reviewed following labs and imaging studies  CBC: Recent Labs  Lab 02/24/17 2041 02/25/17 0504 02/25/17 0853  WBC 17.4* 12.9* 13.7*  NEUTROABS 14.7*  --   --   HGB 13.5 10.7* 11.0*  HCT 38.1* 31.0* 32.1*  MCV 89.0 89.1 89.2  PLT 197 179 937   Basic Metabolic Panel: Recent Labs  Lab 02/24/17 1333 02/24/17 2041 02/25/17 0504  NA  --  132* 136  K  --  4.1 3.8  CL  --  95* 101  CO2  --  24 23  GLUCOSE  --  172* 128*  BUN  --  22* 17  CREATININE 1.60* 1.68* 1.50*  CALCIUM  --  9.0 8.6*   GFR: Estimated Creatinine Clearance: 60.1 mL/min (A) (by C-G formula based on SCr of 1.5 mg/dL (H)). Liver Function Tests: No results for input(s): AST, ALT, ALKPHOS, BILITOT, PROT, ALBUMIN in the last 168 hours. No results for input(s): LIPASE, AMYLASE in the last 168 hours. No results for input(s): AMMONIA in the last 168 hours. Coagulation Profile: No results for input(s): INR, PROTIME in the last 168 hours. Cardiac Enzymes: No results for input(s): CKTOTAL, CKMB, CKMBINDEX, TROPONINI in the last 168 hours. BNP (last 3 results) No results for input(s): PROBNP in the last 8760 hours. HbA1C: No results for input(s): HGBA1C in the last 72 hours. CBG: No results for input(s): GLUCAP in the last 168 hours. Lipid Profile: No results for input(s): CHOL, HDL, LDLCALC, TRIG, CHOLHDL, LDLDIRECT in the last 72 hours. Thyroid Function Tests: No results for input(s): TSH, T4TOTAL, FREET4, T3FREE, THYROIDAB in the last 72 hours. Anemia Panel: No results for input(s): VITAMINB12, FOLATE, FERRITIN, TIBC, IRON, RETICCTPCT in the last 72 hours. Sepsis Labs: Recent Labs  Lab 02/24/17 2111  LATICACIDVEN 1.31    Recent Results (from the past 240 hour(s))  Culture, blood (routine x 2)     Status: None (Preliminary  result)   Collection Time: 02/24/17  8:41 PM  Result Value Ref Range Status   Specimen Description BLOOD RIGHT FOREARM  Final   Special Requests   Final    BOTTLES DRAWN AEROBIC ONLY Blood Culture adequate volume   Culture   Final    NO GROWTH < 12 HOURS Performed at [facility], [address], [place], [place] [address]    Report Status PENDING  Incomplete  Culture, blood (routine x 2)     Status: None (Preliminary result)   Collection Time: 02/24/17  8:41 PM  Result Value Ref Range Status   Specimen Description BLOOD RIGHT ANTECUBITAL  Final   Special Requests   Final  BOTTLES DRAWN AEROBIC ONLY Blood Culture adequate volume   Culture   Final    NO GROWTH < 12 HOURS Performed at [facility] [address] [place], [place] [address]    Report Status PENDING  Incomplete       Radiology Studies: Ct Chest W Contrast  Result Date: 02/24/2017 CLINICAL DATA:  62 year old male with a history of pneumonia 02/21/2017 EXAM: CT CHEST WITH CONTRAST TECHNIQUE: Multidetector CT imaging of the chest was performed during intravenous contrast administration. CONTRAST:  73mL ISOVUE-300 IOPAMIDOL (ISOVUE-300) INJECTION 61% COMPARISON:  Chest x-ray 02/21/2017 FINDINGS: Cardiovascular: Heart size within normal limits. Calcifications of the left main, left anterior descending coronary arteries. Minimal calcifications of the aortic arch and the branch vessels, specifically left subclavian artery. No aneurysm.  No dissection. Pulmonary arteries not well evaluated given the timing of the contrast bolus. Mediastinum/Nodes: Multiple borderline enlarged lymph nodes throughout the mediastinum, including left hilar, subcarinal, paratracheal nodal stations. Index lymph node on the right in the right paratracheal nodal station measures 16 mm. Peribronchial lymph node on the left measures 14 mm. Unremarkable thoracic esophagus. Lungs/Pleura: Significant increase volume of left-sided pleural fluid,  which is loculated within the left fissure, and at the left inferior pleural space with scalloping of the lung, compressive atelectasis, with the largest component in the subpulmonic region. Inflammatory change in thickening along the diaphragm anteriorly. Loculated fluid extending towards the left hilum. Evidence of pleural enhancement at the lung base. There is trace right-sided pleural fluid, in the costophrenic sulcus. Pattern of centrilobular nodularity in predominantly peribronchovascular distribution, mostly of the right upper lobe. Upper Abdomen: No acute Musculoskeletal: No acute displaced fracture. IMPRESSION: Developing left-sided empyema, with significant increase loculated pleural fluid compared to the prior chest x-ray, and associated pleural enhancement. Reactive mediastinal adenopathy. Mild centrilobular nodularity of predominantly the right upper lobe, compatible with infection Small right-sided pleural effusion. Two vessel coronary artery disease. These results were called by telephone at the time of interpretation on 02/24/2017 at 2:08 pm to Dr. Sinda Du , who verbally acknowledged these results. Electronically Signed   By: Corrie Mckusick D.O.   On: 02/24/2017 14:11      Scheduled Meds: . [MAR Hold] ALPRAZolam  0.5 mg Oral BID  . [MAR Hold] anastrozole  1 mg Oral Once per day on Mon Wed Fri  . [MAR Hold] fentaNYL  12.5 mcg Transdermal Q48H  . [MAR Hold] mirtazapine  15 mg Oral Once  . [MAR Hold] pregabalin  75 mg Oral Once  . [MAR Hold] tamsulosin  0.4 mg Oral Daily  . [MAR Hold] traZODone  50 mg Oral QHS   Continuous Infusions: . sodium chloride 50 mL/hr at 02/25/17 0400  . [MAR Hold] levofloxacin (LEVAQUIN) IV Stopped (02/25/17 0128)  . [MAR Hold] piperacillin-tazobactam (ZOSYN)  IV 3.375 g (02/25/17 0836)  . [MAR Hold] vancomycin       LOS: 1 day    Time spent: 40 minutes   Dessa Phi, DO [facility] [email or web address] Password [facility] 02/25/2017, 9:31 AM

## 2017-02-25 NOTE — OR Nursing (Signed)
Dr. Servando Snare paged.

## 2017-02-25 NOTE — Progress Notes (Signed)
Spoke with Dr. Servando Snare during rounds about pt BP being a little high. Orders for Lopressor BID. Will continue to monitor.

## 2017-02-25 NOTE — Consult Note (Addendum)
[address]       [place] [address]             [phone]        Jef L Gainor Pacheco Medical Record #[id number] Date of Birth: 03-Mar-1955  Referring: Dr Luan Pulling  Primary Care: Sinda Du, MD Primary Cardiologist:No primary care provider on file.  Chief Complaint:    Chief Complaint  Patient presents with  . Shortness of Breath    History of Present Illness:     [name] is a 62 year old male patient with a past medical history of prostate cancer, chronic pain with history of thoracic outlet syndrome with previous left upper extremity DVT, sleep apnea, and previous pneumonia with empyema which required surgical intervention by Dr. Rudi Heap in 2010 who presented to [facility] with pleuritic left-sided chest pain.  The pain started a few days ago and the patient thought that he pulled a muscle.  When the pain became worse and began to spread he went to Dr Luan Pulling  for an outpatient x-ray.  ACT scan was done and showed   a loculated pleural effusion concerning for an empyema.  He was admitted to the hospital and started on IV antibiotics.  The patient was ambulatory prior to admission.  He was doing yard work and going about his normal day routine.  However, due to his chronic pain regimen some of his pain may have been masked resulting in a delay of care.  We are consulted for VATs, decortication, and drainage of an empyema.    Current Activity/ Functional Status: Patient was independent with mobility/ambulation, transfers, ADL's, IADL's.   Zubrod Score: At the time of surgery this patient's most appropriate activity status/level should be described as: []     0    Normal activity, no symptoms [x]     1    Restricted in physical strenuous activity but ambulatory, able to do out light work []     2    Ambulatory and capable of self care, unable to do work activities, up and about                 more than 50%  Of the time                            []     3     Only limited self care, in bed greater than 50% of waking hours []     4    Completely disabled, no self care, confined to bed or chair []     5    Moribund  Past Medical History:  Diagnosis Date  . Anxiety   . 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 Carotid artery occlusion    right  . Chronic pain disorder    left arm, shoulder and chest ;thoracic syndrome  . Collapsed lung 2010  . Depression   . Fibromyalgia    "left shoulder area" (10/24/2011)  . GERD (gastroesophageal reflux disease)   . History of blood transfusion 2010  . History of thoracic outlet syndrome 2008   rib removed in 2009  . Insomnia due to medical condition   . Neuromuscular disorder ([place])   . Neuropathy of hand    left  . Pneumonia 2010  . PONV (postoperative nausea and vomiting)   . Prostate cancer ([place])   . Reflex sympathetic dystrophy of the arm  left arm  . RSD (reflex sympathetic dystrophy)    "left shoulder and left arm" (10/24/2011)  . Snoring     Past Surgical History:  Procedure Laterality Date  . ANTERIOR CERVICAL DECOMP/DISCECTOMY FUSION  10/24/2011  . ANTERIOR CERVICAL DECOMP/DISCECTOMY FUSION  10/24/2011   Procedure: ANTERIOR CERVICAL DECOMPRESSION/DISCECTOMY FUSION 2 LEVELS;  Surgeon: Melina Schools, MD;  Location: [place];  Service: Orthopedics;  Laterality: N/A;  ACDF C3-5 C-ARM SKYTRON TABLE SYNTHES VECTOR TITAN CAGE NEURO MONITORING SYTEM  . CYSTECTOMY     left hand x 3  . EYE SURGERY    . KNEE ARTHROSCOPY  1987   right  . REFRACTIVE SURGERY  2003   bilaterally  . RIB RESECTION  2009   "left anterior" (10/24/2011)  . VASCULAR SURGERY     to remove  blood clots    Social History   Tobacco Use  Smoking Status Former Smoker  . Packs/day: 1.00  . Years: 30.00  . Pack years: 30.00  . Types: Cigarettes  . Last attempt to quit: 10/20/2004  . Years since quitting: 12.3  Smokeless Tobacco Current User  . Types: Snuff    Social History   Substance and Sexual Activity  Alcohol  Use No  . Alcohol/week: 0.0 oz    Social History   Socioeconomic History  . Marital status: Married    Spouse name: Not on file  . Number of children: 2  . Years of education: College  . Highest education level: Not on file  Social Needs  . Financial resource strain: Not on file  . Food insecurity - worry: Not on file  . Food insecurity - inability: Not on file  . Transportation needs - medical: Not on file  . Transportation needs - non-medical: Not on file  Occupational History  . Occupation: Disabled  Tobacco Use  . Smoking status: Former Smoker    Packs/day: 1.00    Years: 30.00    Pack years: 30.00    Types: Cigarettes    Last attempt to quit: 10/20/2004    Years since quitting: 12.3  . Smokeless tobacco: Current User    Types: Snuff  Substance and Sexual Activity  . Alcohol use: No    Alcohol/week: 0.0 oz  . Drug use: No  . Sexual activity: Yes  Other Topics Concern  . Not on file  Social History Narrative   Drinks 2 sodas a day    No Known Allergies  Current Facility-Administered Medications  Medication Dose Route Frequency Provider Last Rate Last Dose  . 0.9 %  sodium chloride infusion   Intravenous Continuous Rise Patience, MD 50 mL/hr at 02/25/17 0400    . [MAR Hold] acetaminophen (TYLENOL) tablet 650 mg  650 mg Oral Q6H PRN Rise Patience, MD   650 mg at 02/25/17 0131   Or  . [MAR Hold] acetaminophen (TYLENOL) suppository 650 mg  650 mg Rectal Q6H PRN Rise Patience, MD      . [facility] Hold] ALPRAZolam Duanne Moron) tablet 0.5 mg  0.5 mg Oral BID Dessa Phi, DO      . [MAR Hold] anastrozole (ARIMIDEX) tablet 1 mg  1 mg Oral Once per day on Mon Wed Fri Dessa Phi, DO      . [facility] Hold] cyclobenzaprine (FLEXERIL) tablet 5 mg  5 mg Oral TID PRN Dessa Phi, DO      . [MAR Hold] fentaNYL ([place] - dosed mcg/hr) 12.5 mcg  12.5 mcg Transdermal Q48H Rise Patience, MD      .  fentaNYL (SUBLIMAZE) 100 MCG/2ML injection           . [MAR  Hold] levofloxacin (LEVAQUIN) IVPB 500 mg  500 mg Intravenous Q24H Laren Everts, RPH   Stopped at 02/25/17 0128  . midazolam (VERSED) 2 MG/2ML injection           . [MAR Hold] mirtazapine (REMERON) tablet 15 mg  15 mg Oral Once Rise Patience, MD      . [facility] Sou Hold] ondansetron [facility]) tablet 4 mg  4 mg Oral Q6H PRN Rise Patience, MD       Or  . Doug Sou Hold] ondansetron [facility]) injection 4 mg  4 mg Intravenous Q6H PRN Rise Patience, MD      . Doug Sou Hold] piperacillin-tazobactam (ZOSYN) IVPB 3.375 g  3.375 g Intravenous Q8H Laren Everts, RPH 12.5 mL/hr at 02/25/17 0836 3.375 g at 02/25/17 0836  . [MAR Hold] pregabalin (LYRICA) capsule 75 mg  75 mg Oral Once Rise Patience, MD      . Doug Sou Hold] tamsulosin [facility]) capsule 0.4 mg  0.4 mg Oral Daily Dessa Phi, DO      . [MAR Hold] traZODone (DESYREL) tablet 50 mg  50 mg Oral QHS Dessa Phi, DO      . [MAR Hold] vancomycin (VANCOCIN) IVPB 750 mg/150 ml premix  750 mg Intravenous Q12H Bryk, Veronda P, RPH        Medications Prior to Admission  Medication Sig Dispense Refill Last Dose  . ALPRAZolam (XANAX) 0.5 MG tablet Take 0.5 mg by mouth 2 (two) times daily.   5 02/24/2017 at Unknown time  . anastrozole (ARIMIDEX) 1 MG tablet Take 1 mg by mouth 3 (three) times a week. Sunday, Tuesday and Friday   02/23/2017 at Unknown time  . aspirin EC 81 MG tablet Take 81 mg by mouth daily.   02/24/2017 at Unknown time  . cetirizine (ZYRTEC) 10 MG tablet Take 10 mg by mouth daily.   02/24/2017 at Unknown time  . Cinnamon 500 MG capsule Take 500 mg by mouth daily.    02/24/2017 at Unknown time  . Coenzyme Q10 200 MG capsule Take 200 mg by mouth daily.   02/24/2017 at Unknown time  . cyclobenzaprine (FLEXERIL) 5 MG tablet Take 5 mg by mouth 3 (three) times daily as needed for muscle spasms.   02/23/2017 at Unknown time  . fentaNYL (DURAGESIC - DOSED MCG/HR) 12 MCG/HR Place 12 mcg onto the skin every other day.    02/23/2017 at Unknown  time  . Krill Oil 500 MG CAPS Take 500 mg by mouth daily.    02/24/2017 at Unknown time  . mirtazapine (REMERON) 15 MG tablet Take 15 mg by mouth at bedtime.   02/23/2017 at Unknown time  . naproxen sodium (ANAPROX) 220 MG tablet Take 220 mg by mouth daily as needed (for pain or headache).    Past Week at Unknown time  . pregabalin (LYRICA) 75 MG capsule Take 75-150 mg by mouth See admin instructions. Take 150 mg by mouth in the morning and take 75 mg by mouth at supper   02/24/2017 at Unknown time  . Probiotic Product (PROBIOTIC DAILY PO) Take 1 capsule by mouth daily.    02/24/2017 at Unknown time  . RABEprazole (ACIPHEX) 20 MG tablet Take 20 mg by mouth daily.   02/24/2017 at Unknown time  . SUPER B COMPLEX/C PO Take 1 tablet by mouth daily.   02/24/2017 at Unknown time  . tamsulosin (  FLOMAX) 0.4 MG CAPS capsule Take 0.4 mg by mouth daily.   11 02/24/2017 at Unknown time  . traZODone (DESYREL) 50 MG tablet Take 50 mg by mouth at bedtime.   1 02/23/2017 at Unknown time  . [DISCONTINUED] polyethylene glycol (MIRALAX / GLYCOLAX) packet Take 17 g by mouth daily. Take 1 packet daily until bowels become regular (Patient not taking: Reported on 02/24/2017) 14 each 0 Not Taking at Unknown time    Family History  Problem Relation Age of Onset  . Diabetes Mother      Review of Systems:   Pertinent items are noted in HPI.     Cardiac Review of Systems: Y or  [    ]= no  Chest Pain [ yes   ]  Resting SOB [ no  ] Exertional SOB  [ no ]  Orthopnea [  ]   Pedal Edema no]    Palpitations [  ] Syncope  [  ]   Presyncope [   ]  General Review of Systems: [Y] = yes [  ]=no Constitional: recent weight change [  ]; anorexia [  ]; fatigue [ yes ]; nausea [  ]; night sweats [  ]; fever [ yes ]; or chills [place] ]                                                               Dental: poor dentition[  ]; Last Dentist visit:   Eye : blurred vision [  ]; diplopia [   ]; vision changes [  ];  Amaurosis fugax[  ]; Resp:  cough [no  ];  wheezing[ no ];  hemoptysis[  ]; shortness of breath[ no ]; paroxysmal nocturnal dyspnea[  ]; dyspnea on exertion[  ]; or orthopnea[  ];  GI:  gallstones[  ], vomiting[ no ];  dysphagia[  ]; melena[  ];  hematochezia [  ]; heartburn[  ];   Hx of  Colonoscopy[  ]; GU: kidney stones [  ]; hematuria[  ];   dysuria [  ];  nocturia[  ];  history of     obstruction [  ]; urinary frequency [  ]             Skin: rash, swelling[  ];, hair loss[  ];  peripheral edema[no ];  or itching[  ]; Musculosketetal: myalgias[  ];  joint swelling[  ];  joint erythema[  ];  joint pain[ yes ];  back pain[  ];  Heme/Lymph: bruising[  ];  bleeding[  ];  anemia[  ];  Neuro: TIA[  ];  headaches[  ];  stroke[  ];  vertigo[  ];  seizures[  ];   paresthesias[  ];  difficulty walking[ no ];  Psych:depression[yes  ]; anxiety[yes ];  Endocrine: diabetes[ no ];  thyroid dysfunction[ no ];  Immunizations: Flu [  ]; Pneumococcal[  ];    Physical Exam: BP (!) 163/63 (BP Location: Right Arm)   Pulse 97   Temp 98.6 F (37 C) (Oral)   Resp 17   Ht 6\' 2"  (1.88 m)   Wt 205 lb 0.4 oz (93 kg)   SpO2 93%   BMI 26.32 kg/m    General appearance: alert, cooperative and no distress Resp: clear to  auscultation bilaterally and diminshed breath sounds on the left side Cardio: regular rate and rhythm, S1, S2 normal, no murmur, click, rub or gallop GI: soft, non-tender; bowel sounds normal; no masses,  no organomegaly Extremities: extremities normal, atraumatic, no cyanosis or edema, left-sided arm with chronic pain.  Diagnostic Studies & Laboratory data:     Recent Radiology Findings:   Ct Chest W Contrast  Result Date: 02/24/2017 CLINICAL DATA:  62 year old male with a history of pneumonia 02/21/2017 EXAM: CT CHEST WITH CONTRAST TECHNIQUE: Multidetector CT imaging of the chest was performed during intravenous contrast administration. CONTRAST:  35mL ISOVUE-300 IOPAMIDOL (ISOVUE-300) INJECTION 61% COMPARISON:  Chest  x-ray 02/21/2017 FINDINGS: Cardiovascular: Heart size within normal limits. Calcifications of the left main, left anterior descending coronary arteries. Minimal calcifications of the aortic arch and the branch vessels, specifically left subclavian artery. No aneurysm.  No dissection. Pulmonary arteries not well evaluated given the timing of the contrast bolus. Mediastinum/Nodes: Multiple borderline enlarged lymph nodes throughout the mediastinum, including left hilar, subcarinal, paratracheal nodal stations. Index lymph node on the right in the right paratracheal nodal station measures 16 mm. Peribronchial lymph node on the left measures 14 mm. Unremarkable thoracic esophagus. Lungs/Pleura: Significant increase volume of left-sided pleural fluid, which is loculated within the left fissure, and at the left inferior pleural space with scalloping of the lung, compressive atelectasis, with the largest component in the subpulmonic region. Inflammatory change in thickening along the diaphragm anteriorly. Loculated fluid extending towards the left hilum. Evidence of pleural enhancement at the lung base. There is trace right-sided pleural fluid, in the costophrenic sulcus. Pattern of centrilobular nodularity in predominantly peribronchovascular distribution, mostly of the right upper lobe. Upper Abdomen: No acute Musculoskeletal: No acute displaced fracture. IMPRESSION: Developing left-sided empyema, with significant increase loculated pleural fluid compared to the prior chest x-ray, and associated pleural enhancement. Reactive mediastinal adenopathy. Mild centrilobular nodularity of predominantly the right upper lobe, compatible with infection Small right-sided pleural effusion. Two vessel coronary artery disease. These results were called by telephone at the time of interpretation on 02/24/2017 at 2:08 pm to Dr. Sinda Du , who verbally acknowledged these results. Electronically Signed   By: Corrie Mckusick D.O.   On:  02/24/2017 14:11    I have independently reviewed the above radiology studies  and reviewed the findings with the patient.    Recent Lab Findings: Lab Results  Component Value Date   WBC 13.7 (H) 02/25/2017   HGB 11.0 (L) 02/25/2017   HCT 32.1 (L) 02/25/2017   PLT 166 02/25/2017   GLUCOSE 132 (H) 02/25/2017   ALT 16 (L) 02/25/2017   AST 14 (L) 02/25/2017   NA 137 02/25/2017   K 4.0 02/25/2017   CL 103 02/25/2017   CREATININE 1.57 (H) 02/25/2017   BUN 16 02/25/2017   CO2 22 02/25/2017   INR 1.36 02/25/2017    Chronic Kidney Disease   Stage I     GFR >90  Stage II    GFR 60-89  Stage IIIA GFR 45-59  Stage IIIB GFR 30-44  Stage IV   GFR 15-29  Stage V    GFR  <15  Lab Results  Component Value Date   CREATININE 1.57 (H) 02/25/2017   Estimated Creatinine Clearance: 57.4 mL/min (A) (by C-G formula based on SCr of 1.57 mg/dL (H)).  Assessment / Plan:   1/The procedure of left sided video-assisted thoracoscopic surgery with decortication and drainage of an empyema was discussed with the patient and wife  at the bedside.  All questions were answered to the patient's and family's satisfaction.  2/Stage IIIB chronic kidney disease on admission   The goals risks and alternatives of the planned surgical procedure Procedure(s): VIDEO ASSISTED THORACOSCOPY (VATS)/EMPYEMA (Left)  have been discussed with the patient in detail. The risks of the procedure including death, infection, stroke, myocardial infarction, bleeding, blood transfusion have all been discussed specifically.  I have quoted Arneta Cliche a 2 % of perioperative mortality and a complication rate as high as 30 %. The patient's questions have been answered.ARHAAN CHESNUT is willing  to proceed with the planned procedure.  Grace Isaac MD      [address] [place] [place], [address] Office [phone]   [place]

## 2017-02-25 NOTE — Transfer of Care (Signed)
Immediate Anesthesia Transfer of Care Note  Patient: Kenneth [name]  Procedure(s) Performed: LEFT VIDEO ASSISTED THORACOSCOPY (VATS) (Left Chest) VIDEO BRONCHOSCOPY (N/A ) DECORTICATION (Left Chest) BIOPSY DIAPHRAGMATIC PLAQUE (N/A Chest) LEFT EMPYEMA DRAINAGE (Left Chest)  Patient Location: PACU  Anesthesia Type:General  Level of Consciousness: awake and drowsy  Airway & Oxygen Therapy: Patient Spontanous Breathing and Patient connected to nasal cannula oxygen  Post-op Assessment: Report given to RN and Post -op Vital signs reviewed and stable  Post vital signs: Reviewed and stable  Last Vitals:  Vitals:   02/25/17 0600 02/25/17 0800  BP: (!) 143/64 (!) 163/63  Pulse:  97  Resp: 18 17  Temp:  37 C  SpO2: 98% 93%    Last Pain:  Vitals:   02/25/17 0800  TempSrc: Oral  PainSc:          Complications: No apparent anesthesia complications

## 2017-02-25 NOTE — OR Nursing (Signed)
Patient has blood around the penis meatus near foley catheter. MD aware

## 2017-02-25 NOTE — Anesthesia Procedure Notes (Signed)
Procedure Name: Intubation Date/Time: 02/25/2017 11:05 AM Performed by: Candis Shine, CRNA Pre-anesthesia Checklist: Patient identified, Emergency Drugs available, Suction available and Patient being monitored Patient Re-evaluated:Patient Re-evaluated prior to induction Oxygen Delivery Method: Circle System Utilized Preoxygenation: Pre-oxygenation with 100% oxygen Induction Type: IV induction Ventilation: Mask ventilation without difficulty Laryngoscope Size: Mac and 4 Grade View: Grade II Tube type: Oral Tube size: 8.5 mm Number of attempts: 1 Airway Equipment and Method: Stylet and Oral airway Placement Confirmation: ETT inserted through vocal cords under direct vision,  positive ETCO2 and breath sounds checked- equal and bilateral Secured at: 23 cm Tube secured with: Tape Dental Injury: Teeth and Oropharynx as per pre-operative assessment

## 2017-02-25 NOTE — OR Nursing (Signed)
Full dose Fentanyl PCA checked/confirmed with Carney Living, RN.

## 2017-02-25 NOTE — Anesthesia Preprocedure Evaluation (Addendum)
Anesthesia Evaluation  Patient identified by MRN, date of birth, ID band Patient awake    Reviewed: Allergy & Precautions, H&P , NPO status , Patient's Chart, lab work & pertinent test results  History of Anesthesia Complications (+) PONV  Airway Mallampati: III  TM Distance: >3 FB Neck ROM: Full    Dental no notable dental hx. (+) Teeth Intact, Dental Advisory Given   Pulmonary pneumonia, unresolved, former smoker,    Pulmonary exam normal breath sounds clear to auscultation       Cardiovascular + Peripheral Vascular Disease   Rhythm:Regular Rate:Normal     Neuro/Psych Anxiety Depression negative neurological ROS     GI/Hepatic Neg liver ROS, GERD  Controlled,  Endo/Other  negative endocrine ROS  Renal/GU negative Renal ROS  negative genitourinary   Musculoskeletal  (+) Fibromyalgia -, narcotic dependent  Abdominal   Peds  Hematology negative hematology ROS (+)   Anesthesia Other Findings   Reproductive/Obstetrics negative OB ROS                            Anesthesia Physical Anesthesia Plan  ASA: III  Anesthesia Plan: General   Post-op Pain Management:    Induction: Intravenous  PONV Risk Score and Plan: 3 and Ondansetron, Dexamethasone, Midazolam and Scopolamine patch - Pre-op  Airway Management Planned: Double Lumen EBT  Additional Equipment: Arterial line, CVP and Ultrasound Guidance Line Placement  Intra-op Plan:   Post-operative Plan: Extubation in OR and Possible Post-op intubation/ventilation  Informed Consent: I have reviewed the patients History and Physical, chart, labs and discussed the procedure including the risks, benefits and alternatives for the proposed anesthesia with the patient or authorized representative who has indicated his/her understanding and acceptance.   Dental advisory given  Plan Discussed with: CRNA  Anesthesia Plan Comments:         Anesthesia Quick Evaluation

## 2017-02-25 NOTE — Anesthesia Procedure Notes (Addendum)
Arterial Line Insertion Performed by: Valda Favia, CRNA, CRNA  Preanesthetic checklist: patient identified, IV checked, site marked, risks and benefits discussed, surgical consent, monitors and equipment checked, pre-op evaluation, timeout performed and anesthesia consent Lidocaine 1% used for infiltration and patient sedated Right, radial was placed Catheter size: 20 G Hand hygiene performed , maximum sterile barriers used  and Seldinger technique used Allen's test indicative of satisfactory collateral circulation Attempts: 1 Procedure performed without using ultrasound guided technique. Following insertion, dressing applied and Biopatch. Post procedure assessment: normal  Patient tolerated the procedure well with no immediate complications.

## 2017-02-25 NOTE — Anesthesia Procedure Notes (Signed)
Procedure Name: Intubation Date/Time: 02/25/2017 11:40 AM Performed by: Candis Shine, CRNA Pre-anesthesia Checklist: Patient identified, Emergency Drugs available, Suction available and Patient being monitored Patient Re-evaluated:Patient Re-evaluated prior to induction Oxygen Delivery Method: Circle System Utilized Preoxygenation: Pre-oxygenation with 100% oxygen Induction Type: IV induction Ventilation: Mask ventilation without difficulty Laryngoscope Size: Mac and 4 Grade View: Grade II Tube type: Oral Endobronchial tube: Bronchial Blocker placed under direct vision Tube size: 8.5 mm Number of attempts: 4 Airway Equipment and Method: Stylet and Oral airway Placement Confirmation: ETT inserted through vocal cords under direct vision,  positive ETCO2 and breath sounds checked- equal and bilateral Tube secured with: Tape Dental Injury: Teeth and Oropharynx as per pre-operative assessment  Difficulty Due To: Difficulty was anticipated and Difficult Airway- due to reduced neck mobility Comments: Single lumen tube first inserted with grade III view and removed after bronch, to insert double lumen tube. First attempt to place 38 french double lumen tube was made by SRNA with grade II view, but was not able to advance tube past cords. Second attempt was made after repositioning, but was still not able to advance tube. Third attempt was made by anesthesiologist, but was not able to advance tube, 4th attempt with glidescope with grade I view, but still unable to advance tube past cords. At that point decision was made to place single lumen tube with bronchial blocker. Single lumen tube was placed with ease and bronchial blocker was advanced to left lung visualized on fiberoptic scope.

## 2017-02-25 NOTE — Progress Notes (Addendum)
Patient transferred to short stay via stretcher with OR staff. IV infusing zosyn through 20 gauge right forearm IV. Consents for procedure and blood transfusion signed, dated and witnessed. Report to [facility], charge nurse in short stay. Made aware that attempt for blood gas unsuccessful, mupirocin unavailable to administer but MRSA swab sent to the lab. Family accompanied patient to short stay and are in possession of all patients belongings.

## 2017-02-25 NOTE — OR Nursing (Signed)
Dr. Servando Snare returned page, notified of tiny, less than 5% pneumo on left.  No new orders received, continue to monitor.

## 2017-02-25 NOTE — Anesthesia Procedure Notes (Signed)
Central Venous Catheter Insertion Performed by: Roderic Palau, MD, anesthesiologist Start/End2/12/2017 10:35 AM, 02/25/2017 10:45 AM Patient location: Pre-op. Preanesthetic checklist: patient identified, IV checked, site marked, risks and benefits discussed, surgical consent, monitors and equipment checked, pre-op evaluation, timeout performed and anesthesia consent Position: Trendelenburg Lidocaine 1% used for infiltration and patient sedated Hand hygiene performed , maximum sterile barriers used  and Seldinger technique used Catheter size: 8 Fr Total catheter length 16. Central line was placed.Double lumen Procedure performed using ultrasound guided technique. Ultrasound Notes:anatomy identified, needle tip was noted to be adjacent to the nerve/plexus identified, no ultrasound evidence of intravascular and/or intraneural injection and image(s) printed for medical record Attempts: 1 Following insertion, dressing applied, line sutured and Biopatch. Post procedure assessment: blood return through all ports  Patient tolerated the procedure well with no immediate complications.

## 2017-02-25 NOTE — Progress Notes (Signed)
Attempted to stick pt for RA ABG and got a flash of blood but pt jumped and RT lost blood flow to syringe. Pt then refused to be stuck again. RN aware.

## 2017-02-25 NOTE — OR Nursing (Signed)
X-ray at bedside, chest x-ray obtained.

## 2017-02-25 NOTE — Progress Notes (Signed)
Patient ID: RIDDIK SENNA, male   DOB: 17-Jun-1955, 62 y.o.   MRN: [id number] EVENING ROUNDS NOTE :     [address]       [place] [address]             [phone]                 Day of Surgery Procedure(s) (LRB): LEFT VIDEO ASSISTED THORACOSCOPY (VATS) (Left) VIDEO BRONCHOSCOPY (N/A) DECORTICATION (Left) BIOPSY DIAPHRAGMATIC PLAQUE (N/A) LEFT EMPYEMA DRAINAGE (Left)  Total Length of Stay:  LOS: 1 day  BP 129/82   Pulse 87   Temp 98 F (36.7 C) (Oral)   Resp 16   Ht 6\' 2"  (1.88 m)   Wt 205 lb 0.4 oz (93 kg)   SpO2 98%   BMI 26.32 kg/m   .Intake/Output      02/12 0701 - 02/13 0700   P.O. 200   I.V. (mL/kg) 1413.3 (15.2)   Other 200   IV Piggyback    Total Intake(mL/kg) 1813.3 (19.5)   Urine (mL/kg/hr) 1350 (1.1)   Blood 50   Chest Tube 250   Total Output 1650   Net +163.3       Urine Occurrence 1 x     . dextrose 5 % and 0.45% NaCl 100 mL/hr at 02/25/17 1652  . levofloxacin (LEVAQUIN) IV Stopped (02/25/17 0128)  . piperacillin-tazobactam (ZOSYN)  IV 3.375 g (02/25/17 0836)  . potassium chloride    . vancomycin       Lab Results  Component Value Date   WBC 13.7 (H) 02/25/2017   HGB 11.0 (L) 02/25/2017   HCT 32.1 (L) 02/25/2017   PLT 166 02/25/2017   GLUCOSE 132 (H) 02/25/2017   ALT 16 (L) 02/25/2017   AST 14 (L) 02/25/2017   NA 137 02/25/2017   K 4.0 02/25/2017   CL 103 02/25/2017   CREATININE 1.57 (H) 02/25/2017   BUN 16 02/25/2017   CO2 22 02/25/2017   INR 1.36 02/25/2017   Stable in icu, bp little high start lopressor   Grace Isaac MD  Beeper [phone] Office [phone] 02/25/2017 7:56 PM

## 2017-02-25 NOTE — OR Nursing (Signed)
Phone call from Dr. [employer], radiology.  States "tiny" pneumo on left, probably less than 5%.  Software engineer.

## 2017-02-25 NOTE — Anesthesia Postprocedure Evaluation (Signed)
Anesthesia Post Note  Patient: Kenneth [name]  Procedure(s) Performed: LEFT VIDEO ASSISTED THORACOSCOPY (VATS) (Left Chest) VIDEO BRONCHOSCOPY (N/A ) DECORTICATION (Left Chest) BIOPSY DIAPHRAGMATIC PLAQUE (N/A Chest) LEFT EMPYEMA DRAINAGE (Left Chest)     Patient location during evaluation: PACU Anesthesia Type: General Level of consciousness: awake and alert Pain management: pain level controlled Vital Signs Assessment: post-procedure vital signs reviewed and stable Respiratory status: spontaneous breathing, nonlabored ventilation, respiratory function stable and patient connected to nasal cannula oxygen Cardiovascular status: blood pressure returned to baseline and stable Postop Assessment: no apparent nausea or vomiting Anesthetic complications: no    Last Vitals:  Vitals:   02/25/17 1411 02/25/17 1442  BP:    Pulse: 83   Resp: 15 16  Temp:    SpO2: 93% 94%    Last Pain:  Vitals:   02/25/17 1442  TempSrc:   PainSc: Asleep                 Marwin Primmer,W. EDMOND

## 2017-02-25 NOTE — Progress Notes (Signed)
Per CRNA's, pt had traumatic/difficult  Foley insertion in OR. Blood noted at urinary meatus, uo is yellow. Dr Jobie Quaker here & aware. Will continue to monitor.

## 2017-02-26 ENCOUNTER — Encounter (HOSPITAL_COMMUNITY): Payer: Self-pay | Admitting: Cardiothoracic Surgery

## 2017-02-26 ENCOUNTER — Inpatient Hospital Stay (HOSPITAL_COMMUNITY): Payer: Commercial Managed Care - PPO

## 2017-02-26 LAB — CBC
HCT: 30.9 % — ABNORMAL LOW (ref 39.0–52.0)
Hemoglobin: 10.5 g/dL — ABNORMAL LOW (ref 13.0–17.0)
MCH: 30.5 pg (ref 26.0–34.0)
MCHC: 34 g/dL (ref 30.0–36.0)
MCV: 89.8 fL (ref 78.0–100.0)
PLATELETS: 220 10*3/uL (ref 150–400)
RBC: 3.44 MIL/uL — AB (ref 4.22–5.81)
RDW: 12.7 % (ref 11.5–15.5)
WBC: 13 10*3/uL — ABNORMAL HIGH (ref 4.0–10.5)

## 2017-02-26 LAB — GLUCOSE, CAPILLARY
GLUCOSE-CAPILLARY: 157 mg/dL — AB (ref 65–99)
GLUCOSE-CAPILLARY: 226 mg/dL — AB (ref 65–99)
GLUCOSE-CAPILLARY: 98 mg/dL (ref 65–99)
Glucose-Capillary: 106 mg/dL — ABNORMAL HIGH (ref 65–99)
Glucose-Capillary: 150 mg/dL — ABNORMAL HIGH (ref 65–99)
Glucose-Capillary: 178 mg/dL — ABNORMAL HIGH (ref 65–99)

## 2017-02-26 LAB — ACID FAST SMEAR (AFB, MYCOBACTERIA)
Acid Fast Smear: NEGATIVE
Acid Fast Smear: NEGATIVE

## 2017-02-26 LAB — BASIC METABOLIC PANEL
Anion gap: 11 (ref 5–15)
BUN: 16 mg/dL (ref 6–20)
CO2: 24 mmol/L (ref 22–32)
Calcium: 8.5 mg/dL — ABNORMAL LOW (ref 8.9–10.3)
Chloride: 102 mmol/L (ref 101–111)
Creatinine, Ser: 1.25 mg/dL — ABNORMAL HIGH (ref 0.61–1.24)
GFR calc Af Amer: >60 mL/min (ref >60)
GLUCOSE: 173 mg/dL — AB (ref 65–99)
POTASSIUM: 4.5 mmol/L (ref 3.5–5.1)
Sodium: 137 mmol/L (ref 135–145)

## 2017-02-26 LAB — POCT I-STAT 3, ART BLOOD GAS (G3+)
BICARBONATE: 25.9 mmol/L (ref 20.0–28.0)
O2 SAT: 96 %
PCO2 ART: 44.1 mmHg (ref 32.0–48.0)
Patient temperature: 97.8
TCO2: 27 mmol/L (ref 22–32)
pH, Arterial: 7.374 (ref 7.350–7.450)
pO2, Arterial: 82 mmHg — ABNORMAL LOW (ref 83.0–108.0)

## 2017-02-26 LAB — PATHOLOGIST SMEAR REVIEW

## 2017-02-26 LAB — LEGIONELLA PNEUMOPHILA SEROGP 1 UR AG: L. PNEUMOPHILA SEROGP 1 UR AG: NEGATIVE

## 2017-02-26 MED ORDER — ANASTROZOLE 1 MG PO TABS: 1.0000 mg | ORAL_TABLET | ORAL | Status: DC

## 2017-02-26 MED ORDER — PREGABALIN 75 MG PO CAPS
75.0000 mg | ORAL_CAPSULE | Freq: Two times a day (BID) | ORAL | Status: DC
  Administered 2017-02-26 (×2): 75 mg via ORAL
  Filled 2017-02-26 (×2): qty 1

## 2017-02-26 MED ORDER — CEFEPIME HCL 2 G IJ SOLR
2.0000 g | Freq: Three times a day (TID) | INTRAMUSCULAR | Status: DC
  Administered 2017-02-26 – 2017-03-01 (×8): 2 g via INTRAVENOUS
  Filled 2017-02-26 (×11): qty 2

## 2017-02-26 NOTE — Progress Notes (Addendum)
PROGRESS NOTE    Kenneth [name]  VFI:[id number] DOB: 09-Feb-1955 DOA: 02/24/2017 PCP: Sinda Du, MD     Brief Narrative:  Kenneth [name] is a 62 y.o. male with history of prostate cancer, chronic pain with history of thoracic outlet syndrome with previous history of left upper extremity DVT, sleep apnea who has been experiencing sharp left-sided chest pain last week.  Pain is mostly pleuritic in nature.  Patient's pain worsened and called up his PCP on 2/8 and was called in [place] which patient took last 4 days.  He followed up with primary care physician on 2/11 and had chest x-ray followed by CT chest which showed features concerning for empyema and was told to come to the ER. Patient has had a similar episode in 2010 on the right side and had a VATS done at that time. Patient was admitted and underwent VATS on 2/12 with Dr. Servando Snare.   Assessment & Plan:   Principal Problem:   Empyema ([place]) Active Problems:   Chronic pain   Prostate cancer ([place])   Sepsis secondary to left empyema -Presented with fever 102.3, tachycardia, tachypnea, and leukocytosis 17.4. Lactic acid 1.31.  -S/p VATS 2/12 by Dr. Servando Snare. 2 chest tubes in place -Continue empiric vanco/zosyn/levaquin. Cultures pending.   Chronic pain syndrome -Continue home fentanyl patch -Currently on PCA   Hx of prostate cancer -Continue arimidex  BPH -Continue tamsulosin   OSA -Does not use CPAP   DVT prophylaxis: Lovenox  Code Status: Full Family Communication: No family at bedside Disposition Plan: Pending improvement. Increase mobility, remove foley, discontinue IVF, advance diet. Chest tube in place.    Consultants:   Cardiothoracic surgery  Procedures:   VATS 2/12   Antimicrobials:  Anti-infectives (From admission, onward)   Start     Dose/Rate Route Frequency Ordered Stop   02/25/17 1000  vancomycin (VANCOCIN) IVPB 750 mg/150 ml premix     750 mg 150 mL/hr over 60 Minutes Intravenous Every 12  hours 02/24/17 2340     02/25/17 0600  piperacillin-tazobactam (ZOSYN) IVPB 3.375 g     3.375 g 12.5 mL/hr over 240 Minutes Intravenous Every 8 hours 02/24/17 2340     02/25/17 0000  levofloxacin (LEVAQUIN) IVPB 500 mg     500 mg 100 mL/hr over 60 Minutes Intravenous Every 24 hours 02/24/17 2340     02/24/17 2345  vancomycin (VANCOCIN) 1,500 mg in sodium chloride 0.9 % 500 mL IVPB     1,500 mg 250 mL/hr over 120 Minutes Intravenous  Once 02/24/17 2340 02/25/17 0228   02/24/17 2345  piperacillin-tazobactam (ZOSYN) IVPB 3.375 g     3.375 g 100 mL/hr over 30 Minutes Intravenous  Once 02/24/17 2340 02/25/17 0219       Subjective: Feels flushed. Didn't sleep well overnight due to multiple IV/lines/chest tube. Breathing is stable. Pain well controlled on PCA pump. Hungry and diet was advanced this morning.   Objective: Vitals:   02/26/17 0630 02/26/17 0700 02/26/17 0800 02/26/17 0827  BP: (!) 146/85  (!) 146/81   Pulse: 79  89   Resp: 12 20 20    Temp:    97.8 F (36.6 C)  TempSrc:    Oral  SpO2: 97%  95% 97%  Weight:      Height:        Intake/Output Summary (Last 24 hours) at 02/26/2017 0839 Last data filed at 02/26/2017 0740 Gross per 24 hour  Intake 3455 ml  Output 2900 ml  Net 555  ml   Filed Weights   02/24/17 1856 02/25/17 0112  Weight: 90.3 kg (199 lb) 93 kg (205 lb 0.4 oz)    Examination: General exam: Appears calm and comfortable  Respiratory system: Improved to ascultation bilaterally. Chest tube in place  Cardiovascular system: S1 & S2 heard, RRR. No JVD, murmurs, rubs, gallops or clicks. No pedal edema. Gastrointestinal system: Abdomen is nondistended, soft and nontender. No organomegaly or masses felt. Normal bowel sounds heard. Central nervous system: Alert and oriented. No focal neurological deficits. Extremities: Symmetric 5 x 5 power. Skin: No rashes, lesions or ulcers Psychiatry: Judgement and insight appear normal. Mood & affect appropriate.    Data  Reviewed: I have personally reviewed following labs and imaging studies  CBC: Recent Labs  Lab 02/24/17 2041 02/25/17 0504 02/25/17 0853 02/26/17 0358  WBC 17.4* 12.9* 13.7* 13.0*  NEUTROABS 14.7*  --   --   --   HGB 13.5 10.7* 11.0* 10.5*  HCT 38.1* 31.0* 32.1* 30.9*  MCV 89.0 89.1 89.2 89.8  PLT 197 179 166 850   Basic Metabolic Panel: Recent Labs  Lab 02/24/17 1333 02/24/17 2041 02/25/17 0504 02/25/17 0853 02/26/17 0358  NA  --  132* 136 137 137  K  --  4.1 3.8 4.0 4.5  CL  --  95* 101 103 102  CO2  --  24 23 22 24   GLUCOSE  --  172* 128* 132* 173*  BUN  --  22* 17 16 16   CREATININE 1.60* 1.68* 1.50* 1.57* 1.25*  CALCIUM  --  9.0 8.6* 8.7* 8.5*   GFR: Estimated Creatinine Clearance: 72.2 mL/min (A) (by C-G formula based on SCr of 1.25 mg/dL (H)). Liver Function Tests: Recent Labs  Lab 02/25/17 0853  AST 14*  ALT 16*  ALKPHOS 90  BILITOT 0.9  PROT 6.4*  ALBUMIN 2.5*   No results for input(s): LIPASE, AMYLASE in the last 168 hours. No results for input(s): AMMONIA in the last 168 hours. Coagulation Profile: Recent Labs  Lab 02/25/17 0853  INR 1.36   Cardiac Enzymes: No results for input(s): CKTOTAL, CKMB, CKMBINDEX, TROPONINI in the last 168 hours. BNP (last 3 results) No results for input(s): PROBNP in the last 8760 hours. HbA1C: No results for input(s): HGBA1C in the last 72 hours. CBG: Recent Labs  Lab 02/25/17 1655 02/25/17 2022 02/26/17 0002 02/26/17 0410 02/26/17 0826  GLUCAP 142* 171* 178* 157* 150*   Lipid Profile: No results for input(s): CHOL, HDL, LDLCALC, TRIG, CHOLHDL, LDLDIRECT in the last 72 hours. Thyroid Function Tests: No results for input(s): TSH, T4TOTAL, FREET4, T3FREE, THYROIDAB in the last 72 hours. Anemia Panel: No results for input(s): VITAMINB12, FOLATE, FERRITIN, TIBC, IRON, RETICCTPCT in the last 72 hours. Sepsis Labs: Recent Labs  Lab 02/24/17 2111  LATICACIDVEN 1.31    Recent Results (from the past 240  hour(s))  Culture, blood (routine x 2)     Status: None (Preliminary result)   Collection Time: 02/24/17  8:41 PM  Result Value Ref Range Status   Specimen Description BLOOD RIGHT FOREARM  Final   Special Requests   Final    BOTTLES DRAWN AEROBIC ONLY Blood Culture adequate volume   Culture   Final    NO GROWTH < 12 HOURS Performed at [facility], [address] [place], [place] [address]    Report Status PENDING  Incomplete  Culture, blood (routine x 2)     Status: None (Preliminary result)   Collection Time: 02/24/17  8:41 PM  Result Value Ref Range Status   Specimen Description BLOOD RIGHT ANTECUBITAL  Final   Special Requests   Final    BOTTLES DRAWN AEROBIC ONLY Blood Culture adequate volume   Culture   Final    NO GROWTH < 12 HOURS Performed at [facility], [address] [place], [place] [address]    Report Status PENDING  Incomplete  Surgical PCR screen     Status: None   Collection Time: 02/25/17  9:18 AM  Result Value Ref Range Status   MRSA, PCR NEGATIVE NEGATIVE Final   Staphylococcus aureus NEGATIVE NEGATIVE Final    Comment: (NOTE) The Xpert SA Assay (FDA approved for NASAL specimens in patients 85 years of age and older), is one component of a comprehensive surveillance program. It is not intended to diagnose infection nor to guide or monitor treatment. Performed at [facility], [place] [address], [place], [place] [address]   Culture, respiratory (NON-Expectorated)     Status: None (Preliminary result)   Collection Time: 02/25/17 11:22 AM  Result Value Ref Range Status   Specimen Description BRONCHIAL WASHINGS  Final   Special Requests LEFT BRONCHIAL BRUSHING  Final   Gram Stain   Final    RARE WBC PRESENT,BOTH PMN AND MONONUCLEAR NO ORGANISMS SEEN Performed at [facility], [place] [address], [place], [place] [address]    Culture PENDING  Incomplete   Report Status PENDING  Incomplete  Body fluid culture     Status: None  (Preliminary result)   Collection Time: 02/25/17 12:41 PM  Result Value Ref Range Status   Specimen Description PLEURAL  Final   Special Requests LEFT  Final   Gram Stain   Final    RARE WBC PRESENT, PREDOMINANTLY PMN NO ORGANISMS SEEN Performed at [facility], [place] [address] [place], [place] [address]    Culture PENDING  Incomplete   Report Status PENDING  Incomplete       Radiology Studies: Ct Chest W Contrast  Result Date: 02/24/2017 CLINICAL DATA:  62 year old male with a history of pneumonia 02/21/2017 EXAM: CT CHEST WITH CONTRAST TECHNIQUE: Multidetector CT imaging of the chest was performed during intravenous contrast administration. CONTRAST:  51mL ISOVUE-300 IOPAMIDOL (ISOVUE-300) INJECTION 61% COMPARISON:  Chest x-ray 02/21/2017 FINDINGS: Cardiovascular: Heart size within normal limits. Calcifications of the left main, left anterior descending coronary arteries. Minimal calcifications of the aortic arch and the branch vessels, specifically left subclavian artery. No aneurysm.  No dissection. Pulmonary arteries not well evaluated given the timing of the contrast bolus. Mediastinum/Nodes: Multiple borderline enlarged lymph nodes throughout the mediastinum, including left hilar, subcarinal, paratracheal nodal stations. Index lymph node on the right in the right paratracheal nodal station measures 16 mm. Peribronchial lymph node on the left measures 14 mm. Unremarkable thoracic esophagus. Lungs/Pleura: Significant increase volume of left-sided pleural fluid, which is loculated within the left fissure, and at the left inferior pleural space with scalloping of the lung, compressive atelectasis, with the largest component in the subpulmonic region. Inflammatory change in thickening along the diaphragm anteriorly. Loculated fluid extending towards the left hilum. Evidence of pleural enhancement at the lung base. There is trace right-sided pleural fluid, in the costophrenic sulcus.  Pattern of centrilobular nodularity in predominantly peribronchovascular distribution, mostly of the right upper lobe. Upper Abdomen: No acute Musculoskeletal: No acute displaced fracture. IMPRESSION: Developing left-sided empyema, with significant increase loculated pleural fluid compared to the prior chest x-ray, and associated pleural  enhancement. Reactive mediastinal adenopathy. Mild centrilobular nodularity of predominantly the right upper lobe, compatible with infection Small right-sided pleural effusion. Two vessel coronary artery disease. These results were called by telephone at the time of interpretation on 02/24/2017 at 2:08 pm to Dr. Sinda Du , who verbally acknowledged these results. Electronically Signed   By: Corrie Mckusick D.O.   On: 02/24/2017 14:11   Dg Chest Port 1 View  Result Date: 02/26/2017 CLINICAL DATA:  Empyema EXAM: PORTABLE CHEST 1 VIEW COMPARISON:  02/25/2017 FINDINGS: Right IJ catheter is identified with tip projecting over the distal SVC. 2 left chest tubes are in place. A tiny sliver of left apical pneumothorax is decreased in volume from previous exam. Atelectasis noted in the left base. No evidence for pleural fluid. IMPRESSION: Tiny left apical pneumothorax is noted, decreased from previous exam. Left base atelectasis. Electronically Signed   By: Kerby Moors M.D.   On: 02/26/2017 08:23   Dg Chest Port 1 View  Result Date: 02/25/2017 CLINICAL DATA:  Left-sided chest surgery.  Empyema. EXAM: PORTABLE CHEST 1 VIEW COMPARISON:  CT 02/24/2017. FINDINGS: Right IJ line noted with tip over superior vena cava. Two left chest tubes noted. Tiny left apical pneumothorax. Left lower lobe atelectasis/infiltrate. Small left pleural effusion. Previously identified large left pleural fluid collection has been removed. Prior cervical spine fusion. Cardiomegaly with normal pulmonary vascularity. Postsurgical changes right eighth rib. IMPRESSION: 1. Right IJ line noted with tip over  superior vena cava. Two left chest tubes noted. Tiny left apical pneumothorax. Previously identified large left pleural fluid collection has been removed. 2.  Left base atelectasis/infiltrate.  Small left pleural effusion. 3.  Cardiomegaly.  No pulmonary venous congestion. Critical Value/emergent results were called by telephone at the time of interpretation on 02/25/2017 at 2:48 pm to nurse Linus Orn , who verbally acknowledged these results. Electronically Signed   By: Marcello Moores  Register   On: 02/25/2017 14:49      Scheduled Meds: . acetaminophen  1,000 mg Oral Q6H   Or  . acetaminophen (TYLENOL) oral liquid 160 mg/5 mL  1,000 mg Oral Q6H  . ALPRAZolam  0.5 mg Oral BID  . anastrozole  1 mg Oral Once per day on Mon Wed Fri  . bisacodyl  10 mg Oral Daily  . Chlorhexidine Gluconate Cloth  6 each Topical Daily  . enoxaparin (LOVENOX) injection  40 mg Subcutaneous Daily  . fentaNYL   Intravenous Q4H  . insulin aspart  0-24 Units Subcutaneous Q4H  . mouth rinse  15 mL Mouth Rinse BID  . metoprolol tartrate  12.5 mg Oral BID  . senna-docusate  1 tablet Oral QHS  . sodium chloride flush  10-40 mL Intracatheter Q12H  . tamsulosin  0.4 mg Oral Daily   Continuous Infusions: . levofloxacin (LEVAQUIN) IV Stopped (02/26/17 0332)  . piperacillin-tazobactam (ZOSYN)  IV 3.375 g (02/26/17 0629)  . potassium chloride    . vancomycin Stopped (02/25/17 2342)     LOS: 2 days    Time spent: 30 minutes   Dessa Phi, DO [facility] [email or web address] Password [facility] 02/26/2017, 8:39 AM

## 2017-02-26 NOTE — Progress Notes (Signed)
CT surgery  Patient had excellent day with minimal pain, minimal drainage from chest tubes Ambulating in hallway Appetite improved

## 2017-02-26 NOTE — Op Note (Signed)
NAMESIMS, LADAY NO.:  [id number]  MEDICAL RECORD NO.:  [id number]  LOCATION:  6C02C                        FACILITY:  [facility]  PHYSICIAN:  Lanelle Bal, MD    DATE OF BIRTH:  1955-05-14  DATE OF PROCEDURE:  02/25/2017 DATE OF DISCHARGE:                              OPERATIVE REPORT   PREOPERATIVE DIAGNOSIS:  Left empyema.  POSTOPERATIVE DIAGNOSIS:  Left empyema.  PROCEDURE PERFORMED:  Bronchoscopy, left video-assisted thoracoscopy, minithoracotomy, evacuation of left empyema with decortication and biopsy of pleural plaque on the diaphragm.  SURGEON:  Lanelle Bal, MD.  FIRST ASSISTANT:  Nicholes Rough, [place].  BRIEF HISTORY:  The patient is a 62 year old male with chronic back pain with a previous history of cervical rib incision on the left and previous empyema on the right drained by Dr. Arlyce Dice in 2010.  The patient recently had increasing cough, fever, chills.  He was seen by Dr. Luan Pulling and a CT scan was done, which demonstrated complex left pleural effusion and empyema.  The patient was referred to [facility] for admission and surgical drainage and decortication.  Risks and options were discussed with the patient and his wife in detail.  He was agreeable and signed informed consent.  DESCRIPTION OF PROCEDURE:  The patient underwent general endotracheal anesthesia.  Initially, a single-lumen endotracheal tube was placed without difficulty.  The patient has had cervical spine fusions in the past.  Appropriate time-out was performed and we proceeded with a fiberoptic bronchoscopy to the subsegmental level of both the right and left tracheobronchial trees.  Each segment was examined.  There were no endobronchial lesions appreciated.  Bronchial washings and cultures from the left lower lobe were obtained and submitted to microbiology.  The scope was removed.  Dr. Ola Spurr then attempted to place a double- lumen endotracheal tube, but with  difficulty.  Ultimately, this was abandoned and a single-lumen endotracheal tube with a bronchial blocker was placed.  The patient was then turned in lateral decubitus position with the previously marked left side.  The left chest was prepped with Betadine and draped in usual sterile manner.  Using the CT scan guidance, a small incision was made approximately at 6th intercostal space posterior axillary line.  This was carried down into the chest and into an area of congealed loculated effusion.  Initially, there was difficulty in deflating the lung but with adjustment of the endotracheal tube and a bronchial blocker, we ultimately had satisfactory collapse of the left lung to proceed with complete evacuation of the empyema and effusion and decortication especially of the lower chest where thick fibrinous debris had built up.  Cultures and pathology of this material were sent.  The 30 degree videoscope was used to assist in complete evacuation of the effusion.  On examination of the diaphragm, there was area of pleural plaque that was noted.  Using an endoscopic biopsy forceps, several fragments of this were obtained for permanent section. With the adequate decortication and evacuation of empyema completed, two 32 Blake drains were left in place, 1 anterior and 1 posterior, brought out through separate sites of the left chest wall.  Once muscle layers of incision were  then closed with interrupted 0 Vicryl, running 3-0 Vicryl subcutaneous tissue and 3-0 subcuticular stitch.  Dry dressings were applied.  The lung reinflated nicely with no air leak at the completion of the procedure. Estimated blood loss approximately 50 to 100 mL.  The patient tolerated the procedure without obvious complication.  He was extubated in the operating room and transferred to the recovery room for further postoperative care.  Sponge and needle count was reported as correct at the completion of the  procedure.     Lanelle Bal, MD     EG/MEDQ  D:  02/26/2017  T:  02/26/2017  Job:  [id number]

## 2017-02-26 NOTE — Progress Notes (Signed)
Pharmacy Antibiotic Note  Kenneth [name] is a 62 y.o. male with empyema s/p VATs on zosyn, vancomycin and levaquin.  -WBC= 13, afeb, CrCl ~ 70  Spoke w/ Dr. Maylene Roes: cefepime will provide better coverage of pseudomonas and then could discontinue Levaquin.  Plans will be to treat with Cefepime and vancomycin and narrow based on cultures  Plan: -Vancomycin 750mg  IV q12h -Cefepime 2gm IV q8h -Will follow renal function, cultures and clinical progress   Height: 6\' 2"  (188 cm) Weight: 205 lb 0.4 oz (93 kg) IBW/kg (Calculated) : 82.2  Temp (24hrs), Avg:97.9 F (36.6 C), Min:97.4 F (36.3 C), Max:98.5 F (36.9 C)  Recent Labs  Lab 02/24/17 1333 02/24/17 2041 02/24/17 2111 02/25/17 0504 02/25/17 0853 02/26/17 0358  WBC  --  17.4*  --  12.9* 13.7* 13.0*  CREATININE 1.60* 1.68*  --  1.50* 1.57* 1.25*  LATICACIDVEN  --   --  1.31  --   --   --     Estimated Creatinine Clearance: 72.2 mL/min (A) (by C-G formula based on SCr of 1.25 mg/dL (H)).    No Known Allergies   Thank you for allowing pharmacy to be a part of this patient's care.  Hildred Laser, Pharm D 02/26/2017 10:01 AM

## 2017-02-26 NOTE — Progress Notes (Signed)
Fentanyl PCA syringe replaced, wasted 71mL from previous syringe with Doretha Sou, RN.

## 2017-02-26 NOTE — Progress Notes (Addendum)
T[facility] DAILY ICU PROGRESS NOTE                   [address]            [place] [address]          [phone]   1 Day Post-Op Procedure(s) (LRB): LEFT VIDEO ASSISTED THORACOSCOPY (VATS) (Left) VIDEO BRONCHOSCOPY (N/A) DECORTICATION (Left) BIOPSY DIAPHRAGMATIC PLAQUE (N/A) LEFT EMPYEMA DRAINAGE (Left)  Total Length of Stay:  LOS: 2 days   Subjective: Pain is an 8/10 when moving around.   Objective: Vital signs in last 24 hours: Temp:  [97.4 F (36.3 C)-98.5 F (36.9 C)] 97.8 F (36.6 C) (02/13 0417) Pulse Rate:  [59-91] 89 (02/13 0800) Cardiac Rhythm: Normal sinus rhythm (02/12 2000) Resp:  [8-24] 20 (02/13 0800) BP: (125-161)/(70-95) 146/81 (02/13 0800) SpO2:  [91 %-99 %] 95 % (02/13 0800) Arterial Line BP: (151-182)/(58-84) 182/84 (02/12 1608)  Filed Weights   02/24/17 1856 02/25/17 0112  Weight: 199 lb (90.3 kg) 205 lb 0.4 oz (93 kg)    Weight change:      Intake/Output from previous day: 02/12 0701 - 02/13 0700 In: 3388.3 [P.O.:200; I.V.:2638.3; IV Piggyback:350] Out: 2900 [Urine:2500; Blood:50; Chest Tube:350]  Intake/Output this shift: Total I/O In: 66.7 [I.V.:66.7] Out: -   Current Meds: Scheduled Meds: . acetaminophen  1,000 mg Oral Q6H   Or  . acetaminophen (TYLENOL) oral liquid 160 mg/5 mL  1,000 mg Oral Q6H  . ALPRAZolam  0.5 mg Oral BID  . anastrozole  1 mg Oral Once per day on Mon Wed Fri  . bisacodyl  10 mg Oral Daily  . Chlorhexidine Gluconate Cloth  6 each Topical Daily  . enoxaparin (LOVENOX) injection  40 mg Subcutaneous Daily  . fentaNYL   Intravenous Q4H  . insulin aspart  0-24 Units Subcutaneous Q4H  . mouth rinse  15 mL Mouth Rinse BID  . metoprolol tartrate  12.5 mg Oral BID  . senna-docusate  1 tablet Oral QHS  . sodium chloride flush  10-40 mL Intracatheter Q12H  . tamsulosin  0.4 mg Oral Daily   Continuous Infusions: . dextrose 5 % and 0.45% NaCl Stopped (02/26/17 0740)  . levofloxacin (LEVAQUIN) IV  Stopped (02/26/17 0332)  . piperacillin-tazobactam (ZOSYN)  IV 3.375 g (02/26/17 0629)  . potassium chloride    . vancomycin Stopped (02/25/17 2342)   PRN Meds:.cyclobenzaprine, diphenhydrAMINE **OR** diphenhydrAMINE, fentaNYL (SUBLIMAZE) injection, naloxone **AND** sodium chloride flush, oxyCODONE, potassium chloride, sodium chloride flush, traMADol  General appearance: alert, cooperative and no distress Heart: regular rate and rhythm, S1, S2 normal, no murmur, click, rub or gallop Lungs: clear to auscultation bilaterally Abdomen: soft, non-tender; bowel sounds normal; no masses,  no organomegaly Extremities: extremities normal, atraumatic, no cyanosis or edema Wound: clean and dry  Lab Results: CBC: Recent Labs    02/25/17 0853 02/26/17 0358  WBC 13.7* 13.0*  HGB 11.0* 10.5*  HCT 32.1* 30.9*  PLT 166 220   BMET:  Recent Labs    02/25/17 0853 02/26/17 0358  NA 137 137  K 4.0 4.5  CL 103 102  CO2 22 24  GLUCOSE 132* 173*  BUN 16 16  CREATININE 1.57* 1.25*  CALCIUM 8.7* 8.5*    CMET: Lab Results  Component Value Date   WBC 13.0 (H) 02/26/2017   HGB 10.5 (L) 02/26/2017   HCT 30.9 (L) 02/26/2017   PLT 220 02/26/2017   GLUCOSE 173 (H) 02/26/2017   ALT 16 (L)  02/25/2017   AST 14 (L) 02/25/2017   NA 137 02/26/2017   K 4.5 02/26/2017   CL 102 02/26/2017   CREATININE 1.25 (H) 02/26/2017   BUN 16 02/26/2017   CO2 24 02/26/2017   INR 1.36 02/25/2017      PT/INR:  Recent Labs    02/25/17 0853  LABPROT 16.7*  INR 1.36   Radiology: Dg Chest Port 1 View  Result Date: 02/25/2017 CLINICAL DATA:  Left-sided chest surgery.  Empyema. EXAM: PORTABLE CHEST 1 VIEW COMPARISON:  CT 02/24/2017. FINDINGS: Right IJ line noted with tip over superior vena cava. Two left chest tubes noted. Tiny left apical pneumothorax. Left lower lobe atelectasis/infiltrate. Small left pleural effusion. Previously identified large left pleural fluid collection has been removed. Prior cervical  spine fusion. Cardiomegaly with normal pulmonary vascularity. Postsurgical changes right eighth rib. IMPRESSION: 1. Right IJ line noted with tip over superior vena cava. Two left chest tubes noted. Tiny left apical pneumothorax. Previously identified large left pleural fluid collection has been removed. 2.  Left base atelectasis/infiltrate.  Small left pleural effusion. 3.  Cardiomegaly.  No pulmonary venous congestion. Critical Value/emergent results were called by telephone at the time of interpretation on 02/25/2017 at 2:48 pm to nurse Linus Orn , who verbally acknowledged these results. Electronically Signed   By: Marcello Moores  Register   On: 02/25/2017 14:49     Assessment/Plan: S/P Procedure(s) (LRB): LEFT VIDEO ASSISTED THORACOSCOPY (VATS) (Left) VIDEO BRONCHOSCOPY (N/A) DECORTICATION (Left) BIOPSY DIAPHRAGMATIC PLAQUE (N/A) LEFT EMPYEMA DRAINAGE (Left)  1. CV-BP on the high side at times. 146/81 this morning. Lopressor added. NSR in the 80s.  2. Pulm-tolerating room air with good oxygen saturation. Chest tube with 350cc/24 hours-keep. Placed on water seal this morning.  3. Renal-creatinine 1.25, down from 1.57. Electrolytes okay. Making good urine.  4. Endo-continue SSI for tight glucemic control. 5. H and H stable, expected acute blood loss anemia.  6. ID- continue Levaquin, Vanco, and Zosyn for empyema/pneumonia.  7. Pain control- will add back home Lyrica dose. PCA, IV fentanyl, and oxycodone PO ordered.    Elgie Collard 02/26/2017 8:21 AM   Cultures pending Adjust bp meds  I have seen and examined Kenneth [name] and agree with the above assessment  and plan.  Grace Isaac MD Beeper [phone] Office [phone] 02/26/2017 9:04 AM

## 2017-02-27 ENCOUNTER — Other Ambulatory Visit: Payer: Self-pay

## 2017-02-27 LAB — COMPREHENSIVE METABOLIC PANEL
ALBUMIN: 2.1 g/dL — AB (ref 3.5–5.0)
ALT: 21 U/L (ref 17–63)
AST: 22 U/L (ref 15–41)
Alkaline Phosphatase: 77 U/L (ref 38–126)
Anion gap: 10 (ref 5–15)
BUN: 20 mg/dL (ref 6–20)
CHLORIDE: 102 mmol/L (ref 101–111)
CO2: 28 mmol/L (ref 22–32)
Calcium: 8.4 mg/dL — ABNORMAL LOW (ref 8.9–10.3)
Creatinine, Ser: 1.39 mg/dL — ABNORMAL HIGH (ref 0.61–1.24)
GFR calc Af Amer: >60 mL/min (ref >60)
GFR, EST NON AFRICAN AMERICAN: 53 mL/min — AB (ref >60)
GLUCOSE: 112 mg/dL — AB (ref 65–99)
POTASSIUM: 3.8 mmol/L (ref 3.5–5.1)
SODIUM: 140 mmol/L (ref 135–145)
Total Bilirubin: 0.4 mg/dL (ref 0.3–1.2)
Total Protein: 5.5 g/dL — ABNORMAL LOW (ref 6.5–8.1)

## 2017-02-27 LAB — GLUCOSE, CAPILLARY
GLUCOSE-CAPILLARY: 112 mg/dL — AB (ref 65–99)
Glucose-Capillary: 107 mg/dL — ABNORMAL HIGH (ref 65–99)

## 2017-02-27 LAB — CBC
HCT: 29.3 % — ABNORMAL LOW (ref 39.0–52.0)
Hemoglobin: 9.7 g/dL — ABNORMAL LOW (ref 13.0–17.0)
MCH: 29.9 pg (ref 26.0–34.0)
MCHC: 33.1 g/dL (ref 30.0–36.0)
MCV: 90.4 fL (ref 78.0–100.0)
PLATELETS: 186 10*3/uL (ref 150–400)
RBC: 3.24 MIL/uL — ABNORMAL LOW (ref 4.22–5.81)
RDW: 12.8 % (ref 11.5–15.5)
WBC: 9.8 10*3/uL (ref 4.0–10.5)

## 2017-02-27 MED ORDER — TAMSULOSIN HCL 0.4 MG PO CAPS: 0.4000 mg | ORAL_CAPSULE | Freq: Every day | ORAL | Status: DC

## 2017-02-27 MED ORDER — ALPRAZOLAM 0.5 MG PO TABS
0.5000 mg | ORAL_TABLET | Freq: Two times a day (BID) | ORAL | Status: DC
  Administered 2017-02-27 – 2017-03-01 (×5): 0.5 mg via ORAL
  Filled 2017-02-27 (×5): qty 1

## 2017-02-27 MED ORDER — PREGABALIN 50 MG PO CAPS
150.0000 mg | ORAL_CAPSULE | Freq: Every day | ORAL | Status: DC
  Administered 2017-02-27 – 2017-03-01 (×3): 150 mg via ORAL
  Filled 2017-02-27 (×2): qty 1
  Filled 2017-02-27: qty 2

## 2017-02-27 MED ORDER — TRAZODONE HCL 50 MG PO TABS
50.0000 mg | ORAL_TABLET | Freq: Every day | ORAL | Status: DC
  Administered 2017-02-27: 50 mg via ORAL
  Filled 2017-02-27 (×2): qty 1

## 2017-02-27 MED ORDER — ALPRAZOLAM 0.5 MG PO TABS: 0.5000 mg | ORAL_TABLET | Freq: Two times a day (BID) | ORAL | Status: DC

## 2017-02-27 MED ORDER — MIRTAZAPINE 15 MG PO TABS
15.0000 mg | ORAL_TABLET | Freq: Every day | ORAL | Status: DC
  Administered 2017-02-27 – 2017-02-28 (×2): 15 mg via ORAL
  Filled 2017-02-27 (×2): qty 1

## 2017-02-27 MED ORDER — PREGABALIN 75 MG PO CAPS: 75.0000 mg | ORAL_CAPSULE | Freq: Once | ORAL | Status: DC

## 2017-02-27 MED ORDER — ANASTROZOLE 1 MG PO TABS
1.0000 mg | ORAL_TABLET | ORAL | Status: DC
  Administered 2017-02-28: 1 mg via ORAL
  Filled 2017-02-27: qty 1

## 2017-02-27 MED ORDER — FENTANYL 12 MCG/HR TD PT72
12.0000 ug | MEDICATED_PATCH | TRANSDERMAL | Status: DC
Start: 2017-02-27 — End: 2017-02-28
  Administered 2017-02-27: 12.5 ug via TRANSDERMAL
  Filled 2017-02-27 (×2): qty 1

## 2017-02-27 MED ORDER — PREGABALIN 50 MG PO CAPS
75.0000 mg | ORAL_CAPSULE | Freq: Every day | ORAL | Status: DC
  Administered 2017-02-27 – 2017-02-28 (×2): 75 mg via ORAL
  Filled 2017-02-27 (×2): qty 1

## 2017-02-27 NOTE — Progress Notes (Signed)
Patient arrived from 2 heart. Patient is alert and oriented and VS are stable. Patient is sitting up in chair. Patient has had his CHG bath and has been placed on telemetry.

## 2017-02-27 NOTE — Progress Notes (Signed)
PROGRESS NOTE    Kenneth [name]  TIW:[id number] DOB: [birth year] DOA: 02/24/2017 PCP: Sinda Du, MD     Brief Narrative:  Kenneth [name] is a 62 y.o. male with history of prostate cancer, chronic pain with history of thoracic outlet syndrome with previous history of left upper extremity DVT, sleep apnea who has been experiencing sharp left-sided chest pain last week.  Pain is mostly pleuritic in nature.  Patient's pain worsened and called up his PCP on 2/8 and was called in [place] which patient took last 4 days.  He followed up with primary care physician on 2/11 and had chest x-ray followed by CT chest which showed features concerning for empyema and was told to come to the ER. Patient has had a similar episode in 2010 on the right side and had a VATS done at that time. Patient was admitted and underwent VATS on 2/12 with Dr. Servando Snare.   Assessment & Plan:   Principal Problem:   Empyema ([place]) Active Problems:   Chronic pain   Prostate cancer ([place])   Sepsis secondary to left empyema -Presented with fever 102.3, tachycardia, tachypnea, and leukocytosis 17.4. Lactic acid 1.31.  -S/p VATS 2/12 by Dr. Servando Snare. 2 chest tubes in place -Continue empiric vanco/cefepime. Cultures pending  Chronic pain syndrome -Continue home fentanyl patch, resume home lyrica  -Currently on PCA   Hx of prostate cancer -Continue arimidex  BPH -Continue tamsulosin   Anxiety/depression -Resume home xanax, remeron, trazodone   OSA -Does not use CPAP   DVT prophylaxis: Lovenox  Code Status: Full Family Communication: Wife at bedside  Disposition Plan: Slowly improving. Transfer out of ICU today    Consultants:   Cardiothoracic surgery  Procedures:   VATS 2/12   Antimicrobials:  Anti-infectives (From admission, onward)   Start     Dose/Rate Route Frequency Ordered Stop   02/26/17 1200  ceFEPIme (MAXIPIME) 2 g in sodium chloride 0.9 % 100 mL IVPB     2 g 200 mL/hr over 30 Minutes  Intravenous Every 8 hours 02/26/17 1002     02/25/17 1000  vancomycin (VANCOCIN) IVPB 750 mg/150 ml premix     750 mg 150 mL/hr over 60 Minutes Intravenous Every 12 hours 02/24/17 2340     02/25/17 0600  piperacillin-tazobactam (ZOSYN) IVPB 3.375 g  Status:  Discontinued     3.375 g 12.5 mL/hr over 240 Minutes Intravenous Every 8 hours 02/24/17 2340 02/26/17 0958   02/25/17 0000  levofloxacin (LEVAQUIN) IVPB 500 mg  Status:  Discontinued     500 mg 100 mL/hr over 60 Minutes Intravenous Every 24 hours 02/24/17 2340 02/26/17 0958   02/24/17 2345  vancomycin (VANCOCIN) 1,500 mg in sodium chloride 0.9 % 500 mL IVPB     1,500 mg 250 mL/hr over 120 Minutes Intravenous  Once 02/24/17 2340 02/25/17 0228   02/24/17 2345  piperacillin-tazobactam (ZOSYN) IVPB 3.375 g     3.375 g 100 mL/hr over 30 Minutes Intravenous  Once 02/24/17 2340 02/25/17 0219       Subjective: Doing better this morning. Eating breakfast. Appetite is slowly coming back. Denies shortness of breath, cough. Pain well controlled. Has been ambulating in hall.   Objective: Vitals:   02/27/17 0700 02/27/17 0800 02/27/17 0812 02/27/17 0813  BP:    132/77  Pulse:    94  Resp: 14 17  17   Temp:   98.5 F (36.9 C)   TempSrc:   Oral   SpO2:  95%  96%  Weight:      Height:        Intake/Output Summary (Last 24 hours) at 02/27/2017 0858 Last data filed at 02/27/2017 0500 Gross per 24 hour  Intake 625 ml  Output 1010 ml  Net -385 ml   Filed Weights   02/24/17 1856 02/25/17 0112  Weight: 90.3 kg (199 lb) 93 kg (205 lb 0.4 oz)    Examination: General exam: Appears calm and comfortable  Respiratory system: Clear to auscultation. Respiratory effort normal. Chest tube in place  Cardiovascular system: S1 & S2 heard, RRR. No JVD, murmurs, rubs, gallops or clicks. No pedal edema. Gastrointestinal system: Abdomen is nondistended, soft and nontender. No organomegaly or masses felt.  Central nervous system: Alert and oriented. No  focal neurological deficits. Extremities: Symmetric in appearance  Skin: No rashes, lesions or ulcers on exposed skin  Psychiatry: Judgement and insight appear normal. Mood & affect appropriate.    Data Reviewed: I have personally reviewed following labs and imaging studies  CBC: Recent Labs  Lab 02/24/17 2041 02/25/17 0504 02/25/17 0853 02/26/17 0358 02/27/17 0524  WBC 17.4* 12.9* 13.7* 13.0* 9.8  NEUTROABS 14.7*  --   --   --   --   HGB 13.5 10.7* 11.0* 10.5* 9.7*  HCT 38.1* 31.0* 32.1* 30.9* 29.3*  MCV 89.0 89.1 89.2 89.8 90.4  PLT 197 179 166 220 951   Basic Metabolic Panel: Recent Labs  Lab 02/24/17 2041 02/25/17 0504 02/25/17 0853 02/26/17 0358 02/27/17 0524  NA 132* 136 137 137 140  K 4.1 3.8 4.0 4.5 3.8  CL 95* 101 103 102 102  CO2 24 23 22 24 28   GLUCOSE 172* 128* 132* 173* 112*  BUN 22* 17 16 16 20   CREATININE 1.68* 1.50* 1.57* 1.25* 1.39*  CALCIUM 9.0 8.6* 8.7* 8.5* 8.4*   GFR: Estimated Creatinine Clearance: 64.9 mL/min (A) (by C-G formula based on SCr of 1.39 mg/dL (H)). Liver Function Tests: Recent Labs  Lab 02/25/17 0853 02/27/17 0524  AST 14* 22  ALT 16* 21  ALKPHOS 90 77  BILITOT 0.9 0.4  PROT 6.4* 5.5*  ALBUMIN 2.5* 2.1*   No results for input(s): LIPASE, AMYLASE in the last 168 hours. No results for input(s): AMMONIA in the last 168 hours. Coagulation Profile: Recent Labs  Lab 02/25/17 0853  INR 1.36   Cardiac Enzymes: No results for input(s): CKTOTAL, CKMB, CKMBINDEX, TROPONINI in the last 168 hours. BNP (last 3 results) No results for input(s): PROBNP in the last 8760 hours. HbA1C: No results for input(s): HGBA1C in the last 72 hours. CBG: Recent Labs  Lab 02/26/17 1151 02/26/17 1545 02/26/17 2043 02/26/17 2355 02/27/17 0407  GLUCAP 226* 106* 112* 98 107*   Lipid Profile: No results for input(s): CHOL, HDL, LDLCALC, TRIG, CHOLHDL, LDLDIRECT in the last 72 hours. Thyroid Function Tests: No results for input(s): TSH,  T4TOTAL, FREET4, T3FREE, THYROIDAB in the last 72 hours. Anemia Panel: No results for input(s): VITAMINB12, FOLATE, FERRITIN, TIBC, IRON, RETICCTPCT in the last 72 hours. Sepsis Labs: Recent Labs  Lab 02/24/17 2111  LATICACIDVEN 1.31    Recent Results (from the past 240 hour(s))  Culture, blood (routine x 2)     Status: None (Preliminary result)   Collection Time: 02/24/17  8:41 PM  Result Value Ref Range Status   Specimen Description BLOOD RIGHT FOREARM  Final   Special Requests   Final    BOTTLES DRAWN AEROBIC ONLY Blood Culture adequate volume   Culture   Final  NO GROWTH 2 DAYS Performed at [facility], [place] [address], [place], [place] [address]    Report Status PENDING  Incomplete  Culture, blood (routine x 2)     Status: None (Preliminary result)   Collection Time: 02/24/17  8:41 PM  Result Value Ref Range Status   Specimen Description BLOOD RIGHT ANTECUBITAL  Final   Special Requests   Final    BOTTLES DRAWN AEROBIC ONLY Blood Culture adequate volume   Culture   Final    NO GROWTH 2 DAYS Performed at [facility], [place] [address] [place], [place] [address]    Report Status PENDING  Incomplete  Surgical PCR screen     Status: None   Collection Time: 02/25/17  9:18 AM  Result Value Ref Range Status   MRSA, PCR NEGATIVE NEGATIVE Final   Staphylococcus aureus NEGATIVE NEGATIVE Final    Comment: (NOTE) The Xpert SA Assay (FDA approved for NASAL specimens in patients 76 years of age and older), is one component of a comprehensive surveillance program. It is not intended to diagnose infection nor to guide or monitor treatment. Performed at [facility], [place] [address] [place], [place] [address]   Culture, respiratory (NON-Expectorated)     Status: None (Preliminary result)   Collection Time: 02/25/17 11:22 AM  Result Value Ref Range Status   Specimen Description BRONCHIAL WASHINGS  Final   Special Requests LEFT BRONCHIAL BRUSHING  Final   Gram  Stain   Final    RARE WBC PRESENT,BOTH PMN AND MONONUCLEAR NO ORGANISMS SEEN    Culture   Final    NO GROWTH < 24 HOURS Performed at [facility], [place] [address], [place], [place] [address]    Report Status PENDING  Incomplete  Acid Fast Smear (AFB)     Status: None   Collection Time: 02/25/17 11:22 AM  Result Value Ref Range Status   AFB Specimen Processing Concentration  Final   Acid Fast Smear Negative  Final    Comment: (NOTE) Performed At: [facility] [place], [place] [id number] Rush Farmer MD IR:[id number]    Source (AFB) LEFT  Final    Comment: BRONCHIAL BRUSHING BRONCHIAL WASHINGS Performed at [facility], [place] [address] [place], [place] [address]   Body fluid culture     Status: None (Preliminary result)   Collection Time: 02/25/17 12:41 PM  Result Value Ref Range Status   Specimen Description PLEURAL  Final   Special Requests LEFT  Final   Gram Stain   Final    RARE WBC PRESENT, PREDOMINANTLY PMN NO ORGANISMS SEEN    Culture   Final    NO GROWTH < 24 HOURS Performed at [place] [address], [place], [place] [address]    Report Status PENDING  Incomplete  Acid Fast Smear (AFB)     Status: None   Collection Time: 02/25/17 12:41 PM  Result Value Ref Range Status   AFB Specimen Processing Concentration  Final   Acid Fast Smear Negative  Final    Comment: (NOTE) Performed At: [facility] [place], [place] [id number] Rush Farmer MD FY:[id number]    Source (AFB) LEFT  Final    Comment: PLEURAL Performed at [facility], [place] [address], [place], [place] [address]        Radiology Studies: Dg Chest Port 1 View  Result Date: 02/26/2017 CLINICAL DATA:  Empyema EXAM: PORTABLE  CHEST 1 VIEW COMPARISON:  02/25/2017 FINDINGS: Right IJ catheter is identified with tip projecting over the distal SVC. 2 left chest tubes are in place. A tiny sliver of left apical pneumothorax is  decreased in volume from previous exam. Atelectasis noted in the left base. No evidence for pleural fluid. IMPRESSION: Tiny left apical pneumothorax is noted, decreased from previous exam. Left base atelectasis. Electronically Signed   By: Kerby Moors M.D.   On: 02/26/2017 08:23   Dg Chest Port 1 View  Result Date: 02/25/2017 CLINICAL DATA:  Left-sided chest surgery.  Empyema. EXAM: PORTABLE CHEST 1 VIEW COMPARISON:  CT 02/24/2017. FINDINGS: Right IJ line noted with tip over superior vena cava. Two left chest tubes noted. Tiny left apical pneumothorax. Left lower lobe atelectasis/infiltrate. Small left pleural effusion. Previously identified large left pleural fluid collection has been removed. Prior cervical spine fusion. Cardiomegaly with normal pulmonary vascularity. Postsurgical changes right eighth rib. IMPRESSION: 1. Right IJ line noted with tip over superior vena cava. Two left chest tubes noted. Tiny left apical pneumothorax. Previously identified large left pleural fluid collection has been removed. 2.  Left base atelectasis/infiltrate.  Small left pleural effusion. 3.  Cardiomegaly.  No pulmonary venous congestion. Critical Value/emergent results were called by telephone at the time of interpretation on 02/25/2017 at 2:48 pm to nurse Linus Orn , who verbally acknowledged these results. Electronically Signed   By: Marcello Moores  Register   On: 02/25/2017 14:49      Scheduled Meds: . acetaminophen  1,000 mg Oral Q6H   Or  . acetaminophen (TYLENOL) oral liquid 160 mg/5 mL  1,000 mg Oral Q6H  . ALPRAZolam  0.5 mg Oral BID  . [START ON 02/28/2017] anastrozole  1 mg Oral Once per day on Sun Tue Fri  . bisacodyl  10 mg Oral Daily  . Chlorhexidine Gluconate Cloth  6 each Topical Daily  . enoxaparin (LOVENOX) injection  40 mg Subcutaneous Daily  . fentaNYL  12.5 mcg Transdermal Q48H  . metoprolol tartrate  12.5 mg Oral BID  . mirtazapine  15 mg Oral QHS  . pregabalin  150 mg Oral Daily  . pregabalin   75 mg Oral QHS  . senna-docusate  1 tablet Oral QHS  . sodium chloride flush  10-40 mL Intracatheter Q12H  . tamsulosin  0.4 mg Oral Daily  . traZODone  50 mg Oral QHS   Continuous Infusions: . ceFEPime (MAXIPIME) IV Stopped (02/27/17 0530)  . potassium chloride    . vancomycin Stopped (02/26/17 2300)     LOS: 3 days    Time spent: 30 minutes   Dessa Phi, DO [facility] [email or web address] Password [facility] 02/27/2017, 8:58 AM

## 2017-02-27 NOTE — Plan of Care (Signed)
  Progressing Education: Knowledge of disease or condition will improve 02/27/2017 1509 - Progressing by Jenne Campus, RN Activity: Risk for activity intolerance will decrease 02/27/2017 1509 - Progressing by Jenne Campus, RN Cardiac: Hemodynamic stability will improve 02/27/2017 1509 - Progressing by Jenne Campus, RN Clinical Measurements: Postoperative complications will be avoided or minimized 02/27/2017 1509 - Progressing by Jenne Campus, RN Respiratory: Respiratory status will improve 02/27/2017 1509 - Progressing by Jenne Campus, RN Pain Management: Pain level will decrease 02/27/2017 1509 - Progressing by Jenne Campus, RN Skin Integrity: Wound healing without signs and symptoms infection will improve 02/27/2017 1509 - Progressing by Jenne Campus, RN

## 2017-02-27 NOTE — Progress Notes (Signed)
75ml of Fentanyl PCA wasted in sink. Witnessed by Lurline Hare and Human resources officer.

## 2017-02-28 ENCOUNTER — Inpatient Hospital Stay (HOSPITAL_COMMUNITY): Payer: Commercial Managed Care - PPO

## 2017-02-28 LAB — BODY FLUID CULTURE: Culture: NO GROWTH

## 2017-02-28 LAB — CULTURE, RESPIRATORY W GRAM STAIN: Culture: NO GROWTH

## 2017-02-28 LAB — CBC
HCT: 32.7 % — ABNORMAL LOW (ref 39.0–52.0)
Hemoglobin: 10.8 g/dL — ABNORMAL LOW (ref 13.0–17.0)
MCH: 30 pg (ref 26.0–34.0)
MCHC: 33 g/dL (ref 30.0–36.0)
MCV: 90.8 fL (ref 78.0–100.0)
Platelets: 210 10*3/uL (ref 150–400)
RBC: 3.6 MIL/uL — ABNORMAL LOW (ref 4.22–5.81)
RDW: 12.8 % (ref 11.5–15.5)
WBC: 10.5 10*3/uL (ref 4.0–10.5)

## 2017-02-28 LAB — BASIC METABOLIC PANEL
Anion gap: 13 (ref 5–15)
BUN: 17 mg/dL (ref 6–20)
CO2: 25 mmol/L (ref 22–32)
Calcium: 8.8 mg/dL — ABNORMAL LOW (ref 8.9–10.3)
Chloride: 103 mmol/L (ref 101–111)
Creatinine, Ser: 1.31 mg/dL — ABNORMAL HIGH (ref 0.61–1.24)
GFR calc Af Amer: >60 mL/min (ref >60)
GFR calc non Af Amer: 57 mL/min — ABNORMAL LOW (ref >60)
Glucose, Bld: 117 mg/dL — ABNORMAL HIGH (ref 65–99)
Potassium: 4.1 mmol/L (ref 3.5–5.1)
Sodium: 141 mmol/L (ref 135–145)

## 2017-02-28 MED ORDER — SODIUM CHLORIDE 0.9% FLUSH
3.0000 mL | INTRAVENOUS | Status: DC | PRN
Start: 2017-02-28 — End: 2017-03-01

## 2017-02-28 MED ORDER — METOPROLOL TARTRATE 25 MG PO TABS
25.0000 mg | ORAL_TABLET | Freq: Two times a day (BID) | ORAL | Status: DC
  Administered 2017-02-28 – 2017-03-01 (×3): 25 mg via ORAL
  Filled 2017-02-28 (×3): qty 1

## 2017-02-28 MED ORDER — SODIUM CHLORIDE 0.9% FLUSH
3.0000 mL | Freq: Two times a day (BID) | INTRAVENOUS | Status: DC
  Administered 2017-02-28 (×2): 3 mL via INTRAVENOUS

## 2017-02-28 MED ORDER — FENTANYL 12 MCG/HR TD PT72
12.0000 ug | MEDICATED_PATCH | TRANSDERMAL | Status: DC
  Administered 2017-02-28: 12.5 ug via TRANSDERMAL

## 2017-02-28 MED ORDER — SODIUM CHLORIDE 0.9 % IV SOLN: 250.0000 mL | INTRAVENOUS | Status: DC | PRN

## 2017-02-28 NOTE — Progress Notes (Addendum)
      [address]       [place], [address]             [phone]      3 Days Post-Op Procedure(s) (LRB): LEFT VIDEO ASSISTED THORACOSCOPY (VATS) (Left) VIDEO BRONCHOSCOPY (N/A) DECORTICATION (Left) BIOPSY DIAPHRAGMATIC PLAQUE (N/A) LEFT EMPYEMA DRAINAGE (Left) Subjective: No issues overnight. Wants to go home.   Objective: Vital signs in last 24 hours: Temp:  [98 F (36.7 C)-98.5 F (36.9 C)] 98.2 F (36.8 C) (02/14 2101) Pulse Rate:  [74-94] 85 (02/14 2101) Cardiac Rhythm: Normal sinus rhythm (02/14 2024) Resp:  [16-28] 18 (02/15 0450) BP: (126-161)/(71-91) 161/80 (02/15 0450) SpO2:  [95 %-98 %] 98 % (02/14 2101) Weight:  [202 lb 9.3 oz (91.9 kg)-202 lb 9.6 oz (91.9 kg)] 202 lb 9.3 oz (91.9 kg) (02/15 0450)     Intake/Output from previous day: 02/14 0701 - 02/15 0700 In: 700 [P.O.:440; I.V.:10; IV Piggyback:250] Out: 1035 [Urine:925; Chest Tube:110] Intake/Output this shift: No intake/output data recorded.  General appearance: alert, cooperative and no distress Heart: regular rate and rhythm, S1, S2 normal, no murmur, click, rub or gallop Lungs: clear to auscultation bilaterally Abdomen: soft, non-tender; bowel sounds normal; no masses,  no organomegaly Extremities: extremities normal, atraumatic, no cyanosis or edema Wound: clean and dry  Lab Results: Recent Labs    02/27/17 0524 02/28/17 0451  WBC 9.8 10.5  HGB 9.7* 10.8*  HCT 29.3* 32.7*  PLT 186 210   BMET:  Recent Labs    02/27/17 0524 02/28/17 0451  NA 140 141  K 3.8 4.1  CL 102 103  CO2 28 25  GLUCOSE 112* 117*  BUN 20 17  CREATININE 1.39* 1.31*  CALCIUM 8.4* 8.8*    PT/INR:  Recent Labs    02/25/17 0853  LABPROT 16.7*  INR 1.36   ABG    Component Value Date/Time   PHART 7.374 02/26/2017 0416   HCO3 25.9 02/26/2017 0416   TCO2 27 02/26/2017 0416   O2SAT 96.0 02/26/2017 0416   CBG (last 3)  Recent Labs    02/26/17 2043 02/26/17 2355 02/27/17 0407    GLUCAP 112* 98 107*    Assessment/Plan: S/P Procedure(s) (LRB): LEFT VIDEO ASSISTED THORACOSCOPY (VATS) (Left) VIDEO BRONCHOSCOPY (N/A) DECORTICATION (Left) BIOPSY DIAPHRAGMATIC PLAQUE (N/A) LEFT EMPYEMA DRAINAGE (Left)   1. CV-BP on the high side at times. 161/80 this morning. Lopressor added, will increase today. NSR in the 80s.  2. Pulm-tolerating room air with good oxygen saturation. Chest tube with 110cc/24 hours-+ fluid wave but no air leak. One chest tube remaining. CXR with no pneumo or pleural effusions.   3. Renal-creatinine 1.31, down from 1.39. Electrolytes okay. Making good urine. Weight continues to trend down. 4. Endo-continue SSI for tight glucemic control. 5. H and H stable, expected acute blood loss anemia.  6. ID- continue Maxipime for empyema/pneumonia.  7. Pain control- on home Lyrica dose. Fentanyl patch, oral Oxycodone .    Plan: Discontinue remaining chest tube. Discontinue central line is primary okay with minimal blood draws ( patient has poor veins). CXR tomorrow morning. Watch cultures.     LOS: 4 days    Elgie Collard 02/28/2017

## 2017-02-28 NOTE — Progress Notes (Signed)
Removed Chest Tube per provider orders. Patient tolerated removal well with no complications. Dressed with gauze, area clean, dry and intact. Patient will remain on bed rest for 30 minutes. Removed IJ per provider orders. Patient tolerated removal well. Dressed with gauze and held pressure for 5 minutes, area clean, dry and intact. Patient is alert and oriented and VS are stable.

## 2017-02-28 NOTE — Progress Notes (Signed)
PROGRESS NOTE    Kenneth [name]  IRS:[id number] DOB: [birth year] DOA: 02/24/2017 PCP: Sinda Du, MD     Brief Narrative:  Kenneth [name] is a 62 y.o. male with history of prostate cancer, chronic pain with history of thoracic outlet syndrome with previous history of left upper extremity DVT, sleep apnea who has been experiencing sharp left-sided chest pain last week.  Pain is mostly pleuritic in nature.  Patient's pain worsened and called up his PCP on 2/8 and was called in [place] which patient took last 4 days.  He followed up with primary care physician on 2/11 and had chest x-ray followed by CT chest which showed features concerning for empyema and was told to come to the ER. Patient has had a similar episode in 2010 on the right side and had a VATS done at that time. Patient was admitted and underwent VATS on 2/12 with Dr. Servando Snare.   Assessment & Plan:   Principal Problem:   Empyema ([place]) Active Problems:   Chronic pain   Prostate cancer ([place])   Sepsis secondary to left empyema -Presented with fever 102.3, tachycardia, tachypnea, and leukocytosis 17.4. Lactic acid 1.31.  -S/p VATS 2/12 by Dr. Servando Snare. 2 chest tubes now removed -Repeat CXR in AM -Continue cefepime. Await cultures   Chronic pain syndrome -Continue home fentanyl patch, resume home lyrica   Hx of prostate cancer -Continue arimidex  BPH -Continue tamsulosin   Anxiety/depression -Continue home xanax, remeron, trazodone   OSA -Does not use CPAP   DVT prophylaxis: Lovenox  Code Status: Full Family Communication: Wife at bedside  Disposition Plan: Remove chest tube and central line today. Hopeful discharge home tomorrow    Consultants:   Cardiothoracic surgery  Procedures:   VATS 2/12   Antimicrobials:  Anti-infectives (From admission, onward)   Start     Dose/Rate Route Frequency Ordered Stop   02/26/17 1200  ceFEPIme (MAXIPIME) 2 g in sodium chloride 0.9 % 100 mL IVPB     2 g 200 mL/hr  over 30 Minutes Intravenous Every 8 hours 02/26/17 1002     02/25/17 1000  vancomycin (VANCOCIN) IVPB 750 mg/150 ml premix  Status:  Discontinued     750 mg 150 mL/hr over 60 Minutes Intravenous Every 12 hours 02/24/17 2340 02/28/17 0727   02/25/17 0600  piperacillin-tazobactam (ZOSYN) IVPB 3.375 g  Status:  Discontinued     3.375 g 12.5 mL/hr over 240 Minutes Intravenous Every 8 hours 02/24/17 2340 02/26/17 0958   02/25/17 0000  levofloxacin (LEVAQUIN) IVPB 500 mg  Status:  Discontinued     500 mg 100 mL/hr over 60 Minutes Intravenous Every 24 hours 02/24/17 2340 02/26/17 0958   02/24/17 2345  vancomycin (VANCOCIN) 1,500 mg in sodium chloride 0.9 % 500 mL IVPB     1,500 mg 250 mL/hr over 120 Minutes Intravenous  Once 02/24/17 2340 02/25/17 0228   02/24/17 2345  piperacillin-tazobactam (ZOSYN) IVPB 3.375 g     3.375 g 100 mL/hr over 30 Minutes Intravenous  Once 02/24/17 2340 02/25/17 0219       Subjective: No complaints.   Objective: Vitals:   02/27/17 2101 02/28/17 0450 02/28/17 0800 02/28/17 1159  BP: (!) 149/75 (!) 161/80 (!) 151/76 140/78  Pulse: 85  88   Resp: (!) 28 18 20    Temp: 98.2 F (36.8 C)  97.9 F (36.6 C)   TempSrc: Oral Oral Oral   SpO2: 98%  94%   Weight: 91.9 kg (202 lb 9.6 oz)  91.9 kg (202 lb 9.3 oz)    Height: 6\' 2"  (1.88 m)       Intake/Output Summary (Last 24 hours) at 02/28/2017 1247 Last data filed at 02/28/2017 0610 Gross per 24 hour  Intake 340 ml  Output 985 ml  Net -645 ml   Filed Weights   02/25/17 0112 02/27/17 2101 02/28/17 0450  Weight: 93 kg (205 lb 0.4 oz) 91.9 kg (202 lb 9.6 oz) 91.9 kg (202 lb 9.3 oz)    Examination: General exam: Appears calm and comfortable  Respiratory system: Clear to auscultation. Respiratory effort normal. Chest tube in place  Cardiovascular system: S1 & S2 heard, RRR. No JVD, murmurs, rubs, gallops or clicks. No pedal edema. Gastrointestinal system: Abdomen is nondistended, soft and nontender. No  organomegaly or masses felt. Normal bowel sounds heard. Central nervous system: Alert and oriented. No focal neurological deficits. Extremities: Symmetric 5 x 5 power. Skin: No rashes, lesions or ulcers Psychiatry: Judgement and insight appear normal. Mood & affect appropriate.    Data Reviewed: I have personally reviewed following labs and imaging studies  CBC: Recent Labs  Lab 02/24/17 2041 02/25/17 0504 02/25/17 0853 02/26/17 0358 02/27/17 0524 02/28/17 0451  WBC 17.4* 12.9* 13.7* 13.0* 9.8 10.5  NEUTROABS 14.7*  --   --   --   --   --   HGB 13.5 10.7* 11.0* 10.5* 9.7* 10.8*  HCT 38.1* 31.0* 32.1* 30.9* 29.3* 32.7*  MCV 89.0 89.1 89.2 89.8 90.4 90.8  PLT 197 179 166 220 186 478   Basic Metabolic Panel: Recent Labs  Lab 02/25/17 0504 02/25/17 0853 02/26/17 0358 02/27/17 0524 02/28/17 0451  NA 136 137 137 140 141  K 3.8 4.0 4.5 3.8 4.1  CL 101 103 102 102 103  CO2 23 22 24 28 25   GLUCOSE 128* 132* 173* 112* 117*  BUN 17 16 16 20 17   CREATININE 1.50* 1.57* 1.25* 1.39* 1.31*  CALCIUM 8.6* 8.7* 8.5* 8.4* 8.8*   GFR: Estimated Creatinine Clearance: 68.8 mL/min (A) (by C-G formula based on SCr of 1.31 mg/dL (H)). Liver Function Tests: Recent Labs  Lab 02/25/17 0853 02/27/17 0524  AST 14* 22  ALT 16* 21  ALKPHOS 90 77  BILITOT 0.9 0.4  PROT 6.4* 5.5*  ALBUMIN 2.5* 2.1*   No results for input(s): LIPASE, AMYLASE in the last 168 hours. No results for input(s): AMMONIA in the last 168 hours. Coagulation Profile: Recent Labs  Lab 02/25/17 0853  INR 1.36   Cardiac Enzymes: No results for input(s): CKTOTAL, CKMB, CKMBINDEX, TROPONINI in the last 168 hours. BNP (last 3 results) No results for input(s): PROBNP in the last 8760 hours. HbA1C: No results for input(s): HGBA1C in the last 72 hours. CBG: Recent Labs  Lab 02/26/17 1151 02/26/17 1545 02/26/17 2043 02/26/17 2355 02/27/17 0407  GLUCAP 226* 106* 112* 98 107*   Lipid Profile: No results for  input(s): CHOL, HDL, LDLCALC, TRIG, CHOLHDL, LDLDIRECT in the last 72 hours. Thyroid Function Tests: No results for input(s): TSH, T4TOTAL, FREET4, T3FREE, THYROIDAB in the last 72 hours. Anemia Panel: No results for input(s): VITAMINB12, FOLATE, FERRITIN, TIBC, IRON, RETICCTPCT in the last 72 hours. Sepsis Labs: Recent Labs  Lab 02/24/17 2111  LATICACIDVEN 1.31    Recent Results (from the past 240 hour(s))  Culture, blood (routine x 2)     Status: None (Preliminary result)   Collection Time: 02/24/17  8:41 PM  Result Value Ref Range Status   Specimen Description BLOOD RIGHT FOREARM  Final   Special Requests   Final    BOTTLES DRAWN AEROBIC ONLY Blood Culture adequate volume   Culture   Final    NO GROWTH 3 DAYS Performed at [facility], [address] [place], [place] [address]    Report Status PENDING  Incomplete  Culture, blood (routine x 2)     Status: None (Preliminary result)   Collection Time: 02/24/17  8:41 PM  Result Value Ref Range Status   Specimen Description BLOOD RIGHT ANTECUBITAL  Final   Special Requests   Final    BOTTLES DRAWN AEROBIC ONLY Blood Culture adequate volume   Culture   Final    NO GROWTH 3 DAYS Performed at [facility], [place] [address], [place], [place] [address]    Report Status PENDING  Incomplete  Surgical PCR screen     Status: None   Collection Time: 02/25/17  9:18 AM  Result Value Ref Range Status   MRSA, PCR NEGATIVE NEGATIVE Final   Staphylococcus aureus NEGATIVE NEGATIVE Final    Comment: (NOTE) The Xpert SA Assay (FDA approved for NASAL specimens in patients 23 years of age and older), is one component of a comprehensive surveillance program. It is not intended to diagnose infection nor to guide or monitor treatment. Performed at [facility], [place] [address], [place], [place] [address]   Anaerobic culture     Status: None (Preliminary result)   Collection Time: 02/25/17 11:22 AM  Result Value Ref Range  Status   Specimen Description BRONCHIAL BRUSHING  Final   Special Requests LEFT BRONCHIAL WASHINGS  Final   Culture   Final    NO GROWTH 2 DAYS NO ANAEROBES ISOLATED; CULTURE IN PROGRESS FOR 5 DAYS Performed at [facility], [place] [address], [place], [place] [address]    Report Status PENDING  Incomplete  Culture, respiratory (NON-Expectorated)     Status: None   Collection Time: 02/25/17 11:22 AM  Result Value Ref Range Status   Specimen Description BRONCHIAL WASHINGS  Final   Special Requests LEFT BRONCHIAL BRUSHING  Final   Gram Stain   Final    RARE WBC PRESENT,BOTH PMN AND MONONUCLEAR NO ORGANISMS SEEN    Culture   Final    NO GROWTH 2 DAYS Performed at [facility], [place] [address] [place], [place] [address]    Report Status 02/28/2017 FINAL  Final  Acid Fast Smear (AFB)     Status: None   Collection Time: 02/25/17 11:22 AM  Result Value Ref Range Status   AFB Specimen Processing Concentration  Final   Acid Fast Smear Negative  Final    Comment: (NOTE) Performed At: [facility] [place], [place] [id number] Rush Farmer MD YP:[id number]    Source (AFB) LEFT  Final    Comment: BRONCHIAL BRUSHING BRONCHIAL WASHINGS Performed at [facility], [place] [address], [place], [place] [address]   Body fluid culture     Status: None   Collection Time: 02/25/17 12:41 PM  Result Value Ref Range Status   Specimen Description PLEURAL  Final   Special Requests LEFT  Final   Gram Stain   Final    RARE WBC PRESENT, PREDOMINANTLY PMN NO ORGANISMS SEEN    Culture   Final    No growth aerobically or anaerobically. Performed at [facility], [place] [address] [place], [place] [address]    Report Status 02/28/2017 FINAL  Final  Fungus Culture With Stain     Status: None (Preliminary result)   Collection Time: 02/25/17 12:41 PM  Result Value Ref Range Status   Fungus Stain Final report  Final    Comment: (NOTE) Performed At: [facility] [place], [place] [id number] Rush Farmer MD [id number]    Fungus (Mycology) Culture PENDING  Incomplete   Fungal Source LEFT  Final    Comment: PLEURAL Performed at [facility], [place] [address], [place], [place] [address]   Acid Fast Smear (AFB)     Status: None   Collection Time: 02/25/17 12:41 PM  Result Value Ref Range Status   AFB Specimen Processing Concentration  Final   Acid Fast Smear Negative  Final    Comment: (NOTE) Performed At: [facility] [place], [place] [id number] Rush Farmer MD [id number]:[id number]    Source (AFB) LEFT  Final    Comment: PLEURAL Performed at [facility], [place] [address] [place], [place] [address]   Fungus Culture Result     Status: None   Collection Time: 02/25/17 12:41 PM  Result Value Ref Range Status   Result 1 Comment  Final    Comment: (NOTE) KOH/Calcofluor preparation:  no fungus observed. Performed At: [facility] [place], [place] [id number] Rush Farmer MD [id number] Performed at [facility], [place] [address], [place], [place] [address]        Radiology Studies: Dg Chest Port 1 View  Result Date: 02/28/2017 CLINICAL DATA:  Pneumothorax EXAM: PORTABLE CHEST 1 VIEW COMPARISON:  02/26/2017 FINDINGS: Stable tiny left apical pneumothorax. One left chest tube removed. One left chest tube remains. Stable right jugular central venous catheter. Normal heart size. Bibasilar atelectasis left greater than right improved. Aeration has improved. IMPRESSION: One left chest tube removed with the other left in place. Stable tiny left apical pneumothorax Improved bibasilar atelectasis left greater than right. Electronically Signed   By: Marybelle Killings M.D.   On: 02/28/2017 07:37      Scheduled Meds: . acetaminophen  1,000 mg Oral Q6H   Or  . acetaminophen (TYLENOL) oral liquid 160 mg/5 mL  1,000 mg Oral Q6H  . ALPRAZolam  0.5 mg Oral BID  .  anastrozole  1 mg Oral Once per day on Sun Tue Fri  . bisacodyl  10 mg Oral Daily  . Chlorhexidine Gluconate Cloth  6 each Topical Daily  . enoxaparin (LOVENOX) injection  40 mg Subcutaneous Daily  . fentaNYL  12.5 mcg Transdermal Q48H  . metoprolol tartrate  25 mg Oral BID  . mirtazapine  15 mg Oral QHS  . pregabalin  150 mg Oral Daily  . pregabalin  75 mg Oral QHS  . senna-docusate  1 tablet Oral QHS  . sodium chloride flush  3 mL Intravenous Q12H  . tamsulosin  0.4 mg Oral Daily  . traZODone  50 mg Oral QHS   Continuous Infusions: . sodium chloride    . ceFEPime (MAXIPIME) IV 2 g (02/28/17 1207)     LOS: 4 days    Time spent: 30 minutes   Dessa Phi, DO [facility] [email or web address] Password [facility] 02/28/2017, 12:47 PM

## 2017-03-01 ENCOUNTER — Inpatient Hospital Stay (HOSPITAL_COMMUNITY): Payer: Commercial Managed Care - PPO

## 2017-03-01 LAB — CULTURE, BLOOD (ROUTINE X 2)
CULTURE: NO GROWTH
Culture: NO GROWTH
SPECIAL REQUESTS: ADEQUATE
Special Requests: ADEQUATE

## 2017-03-01 MED ORDER — AMOXICILLIN-POT CLAVULANATE 875-125 MG PO TABS: 1.0000 | ORAL_TABLET | Freq: Two times a day (BID) | ORAL | 0 refills | Status: AC

## 2017-03-01 NOTE — Progress Notes (Signed)
[address]       [place] [address]             [phone]      4 Days Post-Op Procedure(s) (LRB): LEFT VIDEO ASSISTED THORACOSCOPY (VATS) (Left) VIDEO BRONCHOSCOPY (N/A) DECORTICATION (Left) BIOPSY DIAPHRAGMATIC PLAQUE (N/A) LEFT EMPYEMA DRAINAGE (Left) Subjective: Looks and feels well CXR is stable in appearance  Objective: Vital signs in last 24 hours: Temp:  [98.1 F (36.7 C)-98.4 F (36.9 C)] 98.4 F (36.9 C) (02/16 0417) Cardiac Rhythm: Normal sinus rhythm (02/16 0822) Resp:  [18] 18 (02/16 0417) BP: (140-169)/(76-81) 169/81 (02/16 0417) SpO2:  [95 %-96 %] 95 % (02/16 0417)  Hemodynamic parameters for last 24 hours:    Intake/Output from previous day: No intake/output data recorded. Intake/Output this shift: No intake/output data recorded.  General appearance: alert, cooperative and no distress Heart: regular rate and rhythm Lungs: dim in left base Abdomen: benign Extremities: no edema Wound: incis healing well  Lab Results: Recent Labs    02/27/17 0524 02/28/17 0451  WBC 9.8 10.5  HGB 9.7* 10.8*  HCT 29.3* 32.7*  PLT 186 210   BMET:  Recent Labs    02/27/17 0524 02/28/17 0451  NA 140 141  K 3.8 4.1  CL 102 103  CO2 28 25  GLUCOSE 112* 117*  BUN 20 17  CREATININE 1.39* 1.31*  CALCIUM 8.4* 8.8*    PT/INR: No results for input(s): LABPROT, INR in the last 72 hours. ABG    Component Value Date/Time   PHART 7.374 02/26/2017 0416   HCO3 25.9 02/26/2017 0416   TCO2 27 02/26/2017 0416   O2SAT 96.0 02/26/2017 0416   CBG (last 3)  Recent Labs    02/26/17 2043 02/26/17 2355 02/27/17 0407  GLUCAP 112* 98 107*   Results for orders placed or performed during the hospital encounter of 02/24/17  Culture, blood (routine x 2)     Status: None (Preliminary result)   Collection Time: 02/24/17  8:41 PM  Result Value Ref Range Status   Specimen Description BLOOD RIGHT FOREARM  Final   Special Requests   Final   BOTTLES DRAWN AEROBIC ONLY Blood Culture adequate volume   Culture   Final    NO GROWTH 4 DAYS Performed at [facility], [place] [address] [place], [place] [address]    Report Status PENDING  Incomplete  Culture, blood (routine x 2)     Status: None (Preliminary result)   Collection Time: 02/24/17  8:41 PM  Result Value Ref Range Status   Specimen Description BLOOD RIGHT ANTECUBITAL  Final   Special Requests   Final    BOTTLES DRAWN AEROBIC ONLY Blood Culture adequate volume   Culture   Final    NO GROWTH 4 DAYS Performed at [facility], [place] [address] [place], [place] [address]    Report Status PENDING  Incomplete  Surgical PCR screen     Status: None   Collection Time: 02/25/17  9:18 AM  Result Value Ref Range Status   MRSA, PCR NEGATIVE NEGATIVE Final   Staphylococcus aureus NEGATIVE NEGATIVE Final    Comment: (NOTE) The Xpert SA Assay (FDA approved for NASAL specimens in patients 20 years of age and older), is one component of a comprehensive surveillance program. It is not intended to diagnose infection nor to guide or monitor treatment. Performed at [facility], [place] [address], [place],  [address]   Anaerobic  culture     Status: None (Preliminary result)   Collection Time: 02/25/17 11:22 AM  Result Value Ref Range Status   Specimen Description BRONCHIAL BRUSHING  Final   Special Requests LEFT BRONCHIAL WASHINGS  Final   Culture   Final    NO GROWTH 2 DAYS NO ANAEROBES ISOLATED; CULTURE IN PROGRESS FOR 5 DAYS Performed at [facility], [place] [address] [place], [place] [address]    Report Status PENDING  Incomplete  Culture, respiratory (NON-Expectorated)     Status: None   Collection Time: 02/25/17 11:22 AM  Result Value Ref Range Status   Specimen Description BRONCHIAL WASHINGS  Final   Special Requests LEFT BRONCHIAL BRUSHING  Final   Gram Stain   Final    RARE WBC PRESENT,BOTH PMN AND MONONUCLEAR NO ORGANISMS SEEN    Culture    Final    NO GROWTH 2 DAYS Performed at [facility], [place] [address] [place], [place] [address]    Report Status 02/28/2017 FINAL  Final  Acid Fast Smear (AFB)     Status: None   Collection Time: 02/25/17 11:22 AM  Result Value Ref Range Status   AFB Specimen Processing Concentration  Final   Acid Fast Smear Negative  Final    Comment: (NOTE) Performed At: [facility] [place], [place] [id number] Rush Farmer MD [id number]    Source (AFB) LEFT  Final    Comment: BRONCHIAL BRUSHING BRONCHIAL WASHINGS Performed at [facility], [place] [address], [place], [place] [address]   Body fluid culture     Status: None   Collection Time: 02/25/17 12:41 PM  Result Value Ref Range Status   Specimen Description PLEURAL  Final   Special Requests LEFT  Final   Gram Stain   Final    RARE WBC PRESENT, PREDOMINANTLY PMN NO ORGANISMS SEEN    Culture   Final    No growth aerobically or anaerobically. Performed at [facility], [place] [address], [place], [place] [address]    Report Status 02/28/2017 FINAL  Final  Fungus Culture With Stain     Status: None (Preliminary result)   Collection Time: 02/25/17 12:41 PM  Result Value Ref Range Status   Fungus Stain Final report  Final    Comment: (NOTE) Performed At: [facility] [place], [place] [id number] Rush Farmer MD [id number]    Fungus (Mycology) Culture PENDING  Incomplete   Fungal Source LEFT  Final    Comment: PLEURAL Performed at [facility], [place] [address], [place], [place] [address]   Acid Fast Smear (AFB)     Status: None   Collection Time: 02/25/17 12:41 PM  Result Value Ref Range Status   AFB Specimen Processing Concentration  Final   Acid Fast Smear Negative  Final    Comment: (NOTE) Performed At: [facility] [place], [place] [id number] Rush Farmer MD [id number]    Source (AFB) LEFT  Final    Comment:  PLEURAL Performed at [facility], [place] [address] [place], [place] [address]   Fungus Culture Result     Status: None   Collection Time: 02/25/17 12:41 PM  Result Value Ref Range Status   Result 1 Comment  Final    Comment: (NOTE) KOH/Calcofluor preparation:  no fungus observed. Performed At: [facility] [place], [place] [id number] Rush Farmer MD [id number] Performed at [facility], [address] [place], [place] [address]    Meds Scheduled Meds: . acetaminophen  1,000 mg Oral Q6H   Or  . acetaminophen (TYLENOL) oral liquid 160 mg/5 mL  1,000 mg Oral Q6H  . ALPRAZolam  0.5 mg Oral BID  . anastrozole  1 mg Oral Once per day on Sun Tue Fri  . bisacodyl  10 mg Oral Daily  . Chlorhexidine Gluconate Cloth  6 each Topical Daily  . enoxaparin (LOVENOX) injection  40 mg Subcutaneous Daily  . fentaNYL  12.5 mcg Transdermal Q48H  . metoprolol tartrate  25 mg Oral BID  . mirtazapine  15 mg Oral QHS  . pregabalin  150 mg Oral Daily  . pregabalin  75 mg Oral QHS  . senna-docusate  1 tablet Oral QHS  . sodium chloride flush  3 mL Intravenous Q12H  . tamsulosin  0.4 mg Oral Daily  . traZODone  50 mg Oral QHS   Continuous Infusions: . sodium chloride    . ceFEPime (MAXIPIME) IV Stopped (03/01/17 0627)   PRN Meds:.sodium chloride, cyclobenzaprine, diphenhydrAMINE **OR** diphenhydrAMINE, naloxone **AND** sodium chloride flush, oxyCODONE, sodium chloride flush, traMADol  Xrays Dg Chest Port 1 View  Result Date: 03/01/2017 CLINICAL DATA:  Chest tube removal. EXAM: PORTABLE CHEST 1 VIEW COMPARISON:  Chest x-ray from yesterday. FINDINGS: Interval removal of left-sided chest tube and right internal jugular central venous catheter. Stable cardiomediastinal silhouette. Normal pulmonary vascularity. Trace left apical pneumothorax is unchanged. Unchanged small left pleural effusion with left basilar atelectasis. No acute osseous abnormality. IMPRESSION:  1. Stable trace left apical pneumothorax. 2. Unchanged small left pleural effusion with left basilar atelectasis. Electronically Signed   By: Titus Dubin M.D.   On: 03/01/2017 09:00   Dg Chest Port 1 View  Result Date: 02/28/2017 CLINICAL DATA:  Pneumothorax EXAM: PORTABLE CHEST 1 VIEW COMPARISON:  02/26/2017 FINDINGS: Stable tiny left apical pneumothorax. One left chest tube removed. One left chest tube remains. Stable right jugular central venous catheter. Normal heart size. Bibasilar atelectasis left greater than right improved. Aeration has improved. IMPRESSION: One left chest tube removed with the other left in place. Stable tiny left apical pneumothorax Improved bibasilar atelectasis left greater than right. Electronically Signed   By: Marybelle Killings M.D.   On: 02/28/2017 07:37    Assessment/Plan: S/P Procedure(s) (LRB): LEFT VIDEO ASSISTED THORACOSCOPY (VATS) (Left) VIDEO BRONCHOSCOPY (N/A) DECORTICATION (Left) BIOPSY DIAPHRAGMATIC PLAQUE (N/A) LEFT EMPYEMA DRAINAGE (Left)  1 doing well  2 cultures unremarkable, suspect 14 day course of augmentin should be adequate coverage 3 will f/u in office- appt arranged  LOS: 5 days    John Giovanni 03/01/2017

## 2017-03-01 NOTE — Discharge Instructions (Signed)
Thoracoscopy, Care After °Refer to this sheet in the next few weeks. These instructions provide you with information about caring for yourself after your procedure. Your health care provider may also give you more specific instructions. Your treatment has been planned according to current medical practices, but problems sometimes occur. Call your health care provider if you have any problems or questions after your procedure. °What can I expect after the procedure? °After your procedure, it is common to feel sore for up to two weeks. °Follow these instructions at home: °· There are many different ways to close and cover an incision, including stitches (sutures), skin glue, and adhesive strips. Follow your health care provider's instructions about: °? Incision care. °? Bandage (dressing) changes and removal. °? Incision closure removal. °· Check your incision area every day for signs of infection. Watch for: °? Redness, swelling, or pain. °? Fluid, blood, or pus. °· Take medicines only as directed by your health care provider. °· Try to cough often. Coughing helps to protect against lung infection (pneumonia). It may hurt to cough. If this happens, hold a pillow against your chest when you cough. °· Take deep breaths. This also helps to protect against pneumonia. °· If you were given an incentive spirometer, use it as directed by your health care provider. °· Do not take baths, swim, or use a hot tub until your health care provider approves. You may take showers. °· Avoid lifting until your health care provider approves. °· Avoid driving until your health care provider approves. °· Do not travel by airplane after the chest tube is removed until your health care provider approves. °Contact a health care provider if: °· You have a fever. °· Pain medicines do not ease your pain. °· You have redness, swelling, or increasing pain in your incision area. °· You develop a cough that does not go away, or you are coughing up  mucus that is yellow or green. °Get help right away if: °· You have fluid, blood, or pus coming from your incision. °· There is a bad smell coming from your incision or dressing. °· You develop a rash. °· You have difficulty breathing. °· You cough up blood. °· You develop light-headedness or you feel faint. °· You develop chest pain. °· Your heartbeat feels irregular or very fast. °This information is not intended to replace advice given to you by your health care provider. Make sure you discuss any questions you have with your health care provider. °Document Released: 07/20/2004 Document Revised: 09/03/2015 Document Reviewed: 09/15/2013 °Elsevier Interactive Patient Education © 2018 [employer] ° °

## 2017-03-01 NOTE — Progress Notes (Signed)
Discharged to home with family office visits in place teaching done  

## 2017-03-01 NOTE — Discharge Summary (Signed)
Physician Discharge Summary  Kenneth [name]:[id number] DOB: 10/23/1955 DOA: 02/24/2017  PCP: Sinda Du, MD  Admit date: 02/24/2017 Discharge date: 03/01/2017  Admitted From: Home Disposition:  Home  Recommendations for Outpatient Follow-up:  1. Follow up with PCP in 1 week 2. Follow up with Dr. Servando Snare on 3/11 at 3pm  3. Please obtain BMP in 1 week to ensure stability Cr  4. Please follow up on the following pending results: final blood culture and pleural fluid culture results   Discharge Condition: Stable CODE STATUS: Full  Diet recommendation: Heart healthy   Brief/Interim Summary: Kenneth [name] a 62 y.o.malewithhistory of prostate cancer, chronic pain with history of thoracic outlet syndrome with previous history of left upper extremity DVT, sleep apnea who has been experiencing sharp left-sided chest pain last week. Pain is mostly pleuritic in nature. Patient's pain worsened and called up his PCP on 2/8and was called in [place] which patient took last 4 days. He followed up with primary care physician on 2/11 and had chest x-ray followed by CT chest which showed features concerning for empyema and was told to come to the ER. Patient has had a similar episode in 2010 on the right side and had a VATS done at that time. Patient was admitted and underwent VATS on 2/12 with Dr. Servando Snare. He continued to improve and chest tubes were discontinued. Cultures thus far have been negative. Repeat CXR remained stable.   Discharge Diagnoses:  Principal Problem:   Empyema ([place]) Active Problems:   Chronic pain   Prostate cancer ([place])  Sepsis secondary to left empyema -Presented with fever 102.3, tachycardia, tachypnea, and leukocytosis 17.4. Lactic acid 1.31.  -S/p VATS 2/12 by Dr. Servando Snare. 2 chest tubes now removed -Repeat CXR stable  -Pleural fluid cultures negative to date, pending final -Discharge home on augmentin, total 14 day course including hospital course antibiotics    Chronic pain syndrome -Continue home fentanyl patch, resume home lyrica   Hx of prostate cancer -Continue arimidex  BPH -Continue tamsulosin   Anxiety/depression -Continue home xanax, remeron, trazodone   OSA -Does not use CPAP    Discharge Instructions  Discharge Instructions    Call MD for:  difficulty breathing, headache or visual disturbances   Complete by:  As directed    Call MD for:  extreme fatigue   Complete by:  As directed    Call MD for:  hives   Complete by:  As directed    Call MD for:  persistant dizziness or light-headedness   Complete by:  As directed    Call MD for:  persistant nausea and vomiting   Complete by:  As directed    Call MD for:  redness, tenderness, or signs of infection (pain, swelling, redness, odor or green/yellow discharge around incision site)   Complete by:  As directed    Call MD for:  severe uncontrolled pain   Complete by:  As directed    Call MD for:  temperature >100.4   Complete by:  As directed    Diet - low sodium heart healthy   Complete by:  As directed    Discharge instructions   Complete by:  As directed    You were cared for by a hospitalist during your hospital stay. If you have any questions about your discharge medications or the care you received while you were in the hospital after you are discharged, you can call the unit and asked to speak with the hospitalist on call  if the hospitalist that took care of you is not available. Once you are discharged, your primary care physician will handle any further medical issues. Please note that NO REFILLS for any discharge medications will be authorized once you are discharged, as it is imperative that you return to your primary care physician (or establish a relationship with a primary care physician if you do not have one) for your aftercare needs so that they can reassess your need for medications and monitor your lab values.   Increase activity slowly   Complete by:   As directed      Allergies as of 03/01/2017   No Known Allergies     Medication List    TAKE these medications   ALPRAZolam 0.5 MG tablet Commonly known as:  XANAX Take 0.5 mg by mouth 2 (two) times daily.   amoxicillin-clavulanate 875-125 MG tablet Commonly known as:  AUGMENTIN Take 1 tablet by mouth 2 (two) times daily for 10 days.   anastrozole 1 MG tablet Commonly known as:  ARIMIDEX Take 1 mg by mouth 3 (three) times a week. Sunday, Tuesday and Friday   aspirin EC 81 MG tablet Take 81 mg by mouth daily.   cetirizine 10 MG tablet Commonly known as:  ZYRTEC Take 10 mg by mouth daily.   Cinnamon 500 MG capsule Take 500 mg by mouth daily.   Coenzyme Q10 200 MG capsule Take 200 mg by mouth daily.   cyclobenzaprine 5 MG tablet Commonly known as:  FLEXERIL Take 5 mg by mouth 3 (three) times daily as needed for muscle spasms.   fentaNYL 12 MCG/HR Commonly known as:  DURAGESIC - dosed mcg/hr Place 12 mcg onto the skin every other day.   Krill Oil 500 MG Caps Take 500 mg by mouth daily.   mirtazapine 15 MG tablet Commonly known as:  REMERON Take 15 mg by mouth at bedtime.   naproxen sodium 220 MG tablet Commonly known as:  ALEVE Take 220 mg by mouth daily as needed (for pain or headache).   pregabalin 75 MG capsule Commonly known as:  LYRICA Take 75-150 mg by mouth See admin instructions. Take 150 mg by mouth in the morning and take 75 mg by mouth at supper   PROBIOTIC DAILY PO Take 1 capsule by mouth daily.   RABEprazole 20 MG tablet Commonly known as:  ACIPHEX Take 20 mg by mouth daily.   SUPER B COMPLEX/C PO Take 1 tablet by mouth daily.   tamsulosin 0.4 MG Caps capsule Commonly known as:  FLOMAX Take 0.4 mg by mouth daily.   traZODone 50 MG tablet Commonly known as:  DESYREL Take 50 mg by mouth at bedtime.      Follow-up Information    Grace Isaac, MD Follow up.   Specialty:  Cardiothoracic Surgery Why:  Your appointment is on  03/24/2017 at 3:00pm. Please arrive at 2:30pm for a chest xray located at [facility] which is on the first floor of our building.  Contact information: [place] [address] [phone]        Sinda Du, MD. Schedule an appointment as soon as possible for a visit in 1 week(s).   Specialty:  Pulmonary Disease Contact information: [place] [address] [phone]          No Known Allergies  Consultations:  CTS    Procedures/Studies: Dg Chest 2 View  Result Date: 02/21/2017 CLINICAL DATA:  Hervey Ard  left-sided chest pain with cough. Low-grade fever. EXAM: CHEST  2 VIEW COMPARISON:  11/24/2013 FINDINGS: Heart size is normal. Mediastinal shadows are normal. There is chronic pleural scarring at the bases with more pleural blunting presently seen, suggesting small effusions. Basilar lung markings are more prominent, particularly on the left, suggesting basilar pneumonia, more likely on the left. Upper lungs are clear. No evidence of heart failure. No acute bone finding. IMPRESSION: Chronic pleural and parenchymal scarring at the bases. Superimposed pleural fluid and worsened basilar density, left more than right, suggesting mild basilar pneumonia. Electronically Signed   By: Nelson Chimes M.D.   On: 02/21/2017 13:23   Ct Chest W Contrast  Result Date: 02/24/2017 CLINICAL DATA:  62 year old male with a history of pneumonia 02/21/2017 EXAM: CT CHEST WITH CONTRAST TECHNIQUE: Multidetector CT imaging of the chest was performed during intravenous contrast administration. CONTRAST:  72mL ISOVUE-300 IOPAMIDOL (ISOVUE-300) INJECTION 61% COMPARISON:  Chest x-ray 02/21/2017 FINDINGS: Cardiovascular: Heart size within normal limits. Calcifications of the left main, left anterior descending coronary arteries. Minimal calcifications of the aortic arch and the branch vessels, specifically left subclavian artery. No aneurysm.  No  dissection. Pulmonary arteries not well evaluated given the timing of the contrast bolus. Mediastinum/Nodes: Multiple borderline enlarged lymph nodes throughout the mediastinum, including left hilar, subcarinal, paratracheal nodal stations. Index lymph node on the right in the right paratracheal nodal station measures 16 mm. Peribronchial lymph node on the left measures 14 mm. Unremarkable thoracic esophagus. Lungs/Pleura: Significant increase volume of left-sided pleural fluid, which is loculated within the left fissure, and at the left inferior pleural space with scalloping of the lung, compressive atelectasis, with the largest component in the subpulmonic region. Inflammatory change in thickening along the diaphragm anteriorly. Loculated fluid extending towards the left hilum. Evidence of pleural enhancement at the lung base. There is trace right-sided pleural fluid, in the costophrenic sulcus. Pattern of centrilobular nodularity in predominantly peribronchovascular distribution, mostly of the right upper lobe. Upper Abdomen: No acute Musculoskeletal: No acute displaced fracture. IMPRESSION: Developing left-sided empyema, with significant increase loculated pleural fluid compared to the prior chest x-ray, and associated pleural enhancement. Reactive mediastinal adenopathy. Mild centrilobular nodularity of predominantly the right upper lobe, compatible with infection Small right-sided pleural effusion. Two vessel coronary artery disease. These results were called by telephone at the time of interpretation on 02/24/2017 at 2:08 pm to Dr. Sinda Du , who verbally acknowledged these results. Electronically Signed   By: Corrie Mckusick D.O.   On: 02/24/2017 14:11   Dg Chest Port 1 View  Result Date: 03/01/2017 CLINICAL DATA:  Chest tube removal. EXAM: PORTABLE CHEST 1 VIEW COMPARISON:  Chest x-ray from yesterday. FINDINGS: Interval removal of left-sided chest tube and right internal jugular central venous  catheter. Stable cardiomediastinal silhouette. Normal pulmonary vascularity. Trace left apical pneumothorax is unchanged. Unchanged small left pleural effusion with left basilar atelectasis. No acute osseous abnormality. IMPRESSION: 1. Stable trace left apical pneumothorax. 2. Unchanged small left pleural effusion with left basilar atelectasis. Electronically Signed   By: Titus Dubin M.D.   On: 03/01/2017 09:00   Dg Chest Port 1 View  Result Date: 02/28/2017 CLINICAL DATA:  Pneumothorax EXAM: PORTABLE CHEST 1 VIEW COMPARISON:  02/26/2017 FINDINGS: Stable tiny left apical pneumothorax. One left chest tube removed. One left chest tube remains. Stable right jugular central venous catheter. Normal heart size. Bibasilar atelectasis left greater than right improved. Aeration has improved. IMPRESSION: One left chest tube removed with the other left in place.  Stable tiny left apical pneumothorax Improved bibasilar atelectasis left greater than right. Electronically Signed   By: Marybelle Killings M.D.   On: 02/28/2017 07:37   Dg Chest Port 1 View  Result Date: 02/26/2017 CLINICAL DATA:  Empyema EXAM: PORTABLE CHEST 1 VIEW COMPARISON:  02/25/2017 FINDINGS: Right IJ catheter is identified with tip projecting over the distal SVC. 2 left chest tubes are in place. A tiny sliver of left apical pneumothorax is decreased in volume from previous exam. Atelectasis noted in the left base. No evidence for pleural fluid. IMPRESSION: Tiny left apical pneumothorax is noted, decreased from previous exam. Left base atelectasis. Electronically Signed   By: Kerby Moors M.D.   On: 02/26/2017 08:23   Dg Chest Port 1 View  Result Date: 02/25/2017 CLINICAL DATA:  Left-sided chest surgery.  Empyema. EXAM: PORTABLE CHEST 1 VIEW COMPARISON:  CT 02/24/2017. FINDINGS: Right IJ line noted with tip over superior vena cava. Two left chest tubes noted. Tiny left apical pneumothorax. Left lower lobe atelectasis/infiltrate. Small left pleural  effusion. Previously identified large left pleural fluid collection has been removed. Prior cervical spine fusion. Cardiomegaly with normal pulmonary vascularity. Postsurgical changes right eighth rib. IMPRESSION: 1. Right IJ line noted with tip over superior vena cava. Two left chest tubes noted. Tiny left apical pneumothorax. Previously identified large left pleural fluid collection has been removed. 2.  Left base atelectasis/infiltrate.  Small left pleural effusion. 3.  Cardiomegaly.  No pulmonary venous congestion. Critical Value/emergent results were called by telephone at the time of interpretation on 02/25/2017 at 2:48 pm to nurse Linus Orn , who verbally acknowledged these results. Electronically Signed   By: Marcello Moores  Register   On: 02/25/2017 14:49      Discharge Exam: Vitals:   02/28/17 2116 03/01/17 0417  BP: (!) 146/76 (!) 169/81  Pulse:    Resp:  18  Temp: 98.1 F (36.7 C) 98.4 F (36.9 C)  SpO2: 96% 95%   Vitals:   02/28/17 0800 02/28/17 1159 02/28/17 2116 03/01/17 0417  BP: (!) 151/76 140/78 (!) 146/76 (!) 169/81  Pulse: 88     Resp: 20   18  Temp: 97.9 F (36.6 C)  98.1 F (36.7 C) 98.4 F (36.9 C)  TempSrc: Oral  Oral Oral  SpO2: 94%  96% 95%  Weight:      Height:        General: Pt is alert, awake, not in acute distress Cardiovascular: RRR, S1/S2 +, no rubs, no gallops Respiratory: Crackles LLL, no wheezing, no rhonchi, no distress, on room air  Abdominal: Soft, NT, ND, bowel sounds + Extremities: no edema, no cyanosis    The results of significant diagnostics from this hospitalization (including imaging, microbiology, ancillary and laboratory) are listed below for reference.     Microbiology: Recent Results (from the past 240 hour(s))  Culture, blood (routine x 2)     Status: None (Preliminary result)   Collection Time: 02/24/17  8:41 PM  Result Value Ref Range Status   Specimen Description BLOOD RIGHT FOREARM  Final   Special Requests   Final    BOTTLES  DRAWN AEROBIC ONLY Blood Culture adequate volume   Culture   Final    NO GROWTH 4 DAYS Performed at [facility], [address], [place], [place] [address]    Report Status PENDING  Incomplete  Culture, blood (routine x 2)     Status: None (Preliminary result)   Collection Time: 02/24/17  8:41 PM  Result Value  Ref Range Status   Specimen Description BLOOD RIGHT ANTECUBITAL  Final   Special Requests   Final    BOTTLES DRAWN AEROBIC ONLY Blood Culture adequate volume   Culture   Final    NO GROWTH 4 DAYS Performed at [facility], [address] [place], [place] [address]    Report Status PENDING  Incomplete  Surgical PCR screen     Status: None   Collection Time: 02/25/17  9:18 AM  Result Value Ref Range Status   MRSA, PCR NEGATIVE NEGATIVE Final   Staphylococcus aureus NEGATIVE NEGATIVE Final    Comment: (NOTE) The Xpert SA Assay (FDA approved for NASAL specimens in patients 36 years of age and older), is one component of a comprehensive surveillance program. It is not intended to diagnose infection nor to guide or monitor treatment. Performed at [facility], [place] [address], [place], [place] [address]   Anaerobic culture     Status: None (Preliminary result)   Collection Time: 02/25/17 11:22 AM  Result Value Ref Range Status   Specimen Description BRONCHIAL BRUSHING  Final   Special Requests LEFT BRONCHIAL WASHINGS  Final   Culture   Final    NO GROWTH 2 DAYS NO ANAEROBES ISOLATED; CULTURE IN PROGRESS FOR 5 DAYS Performed at [facility], [place] [address] [place], [place] [address]    Report Status PENDING  Incomplete  Culture, respiratory (NON-Expectorated)     Status: None   Collection Time: 02/25/17 11:22 AM  Result Value Ref Range Status   Specimen Description BRONCHIAL WASHINGS  Final   Special Requests LEFT BRONCHIAL BRUSHING  Final   Gram Stain   Final    RARE WBC PRESENT,BOTH PMN AND MONONUCLEAR NO ORGANISMS SEEN    Culture   Final     NO GROWTH 2 DAYS Performed at [facility], [place] [address] [place], [place] [address]    Report Status 02/28/2017 FINAL  Final  Acid Fast Smear (AFB)     Status: None   Collection Time: 02/25/17 11:22 AM  Result Value Ref Range Status   AFB Specimen Processing Concentration  Final   Acid Fast Smear Negative  Final    Comment: (NOTE) Performed At: [facility] [place], [place] [id number] Rush Farmer MD QI:[id number]    Source (AFB) LEFT  Final    Comment: BRONCHIAL BRUSHING BRONCHIAL WASHINGS Performed at [facility], [place] [address], [place], [place] [address]   Body fluid culture     Status: None   Collection Time: 02/25/17 12:41 PM  Result Value Ref Range Status   Specimen Description PLEURAL  Final   Special Requests LEFT  Final   Gram Stain   Final    RARE WBC PRESENT, PREDOMINANTLY PMN NO ORGANISMS SEEN    Culture   Final    No growth aerobically or anaerobically. Performed at [facility], [place] [address], [place], [place] [address]    Report Status 02/28/2017 FINAL  Final  Fungus Culture With Stain     Status: None (Preliminary result)   Collection Time: 02/25/17 12:41 PM  Result Value Ref Range Status   Fungus Stain Final report  Final    Comment: (NOTE) Performed At: [facility] [place], [place] [id number] Rush Farmer MD WY:[id number]    Fungus (Mycology) Culture PENDING  Incomplete   Fungal Source LEFT  Final    Comment: PLEURAL Performed at [facility], [place] [address] [place], [place] [address]   Acid Fast Smear (AFB)     Status: None   Collection Time: 02/25/17 12:41 PM  Result Value Ref Range Status   AFB Specimen Processing Concentration  Final   Acid Fast Smear Negative  Final    Comment: (NOTE) Performed At: [facility] [place], [place] [id number] Rush Farmer MD NO:[id number]    Source (AFB) LEFT  Final    Comment: PLEURAL Performed at  [facility], [place] [address], [place], [place] [address]   Fungus Culture Result     Status: None   Collection Time: 02/25/17 12:41 PM  Result Value Ref Range Status   Result 1 Comment  Final    Comment: (NOTE) KOH/Calcofluor preparation:  no fungus observed. Performed At: [facility] [place], [place] [id number] Rush Farmer MD [id number] Performed at [facility], [place] [address], [place], [place] [address]      Labs: BNP (last 3 results) No results for input(s): BNP in the last 8760 hours. Basic Metabolic Panel: Recent Labs  Lab 02/25/17 0504 02/25/17 0853 02/26/17 0358 02/27/17 0524 02/28/17 0451  NA 136 137 137 140 141  K 3.8 4.0 4.5 3.8 4.1  CL 101 103 102 102 103  CO2 23 22 24 28 25   GLUCOSE 128* 132* 173* 112* 117*  BUN 17 16 16 20 17   CREATININE 1.50* 1.57* 1.25* 1.39* 1.31*  CALCIUM 8.6* 8.7* 8.5* 8.4* 8.8*   Liver Function Tests: Recent Labs  Lab 02/25/17 0853 02/27/17 0524  AST 14* 22  ALT 16* 21  ALKPHOS 90 77  BILITOT 0.9 0.4  PROT 6.4* 5.5*  ALBUMIN 2.5* 2.1*   No results for input(s): LIPASE, AMYLASE in the last 168 hours. No results for input(s): AMMONIA in the last 168 hours. CBC: Recent Labs  Lab 02/24/17 2041 02/25/17 0504 02/25/17 0853 02/26/17 0358 02/27/17 0524 02/28/17 0451  WBC 17.4* 12.9* 13.7* 13.0* 9.8 10.5  NEUTROABS 14.7*  --   --   --   --   --   HGB 13.5 10.7* 11.0* 10.5* 9.7* 10.8*  HCT 38.1* 31.0* 32.1* 30.9* 29.3* 32.7*  MCV 89.0 89.1 89.2 89.8 90.4 90.8  PLT 197 179 166 220 186 210   Cardiac Enzymes: No results for input(s): CKTOTAL, CKMB, CKMBINDEX, TROPONINI in the last 168 hours. BNP: Invalid input(s): POCBNP CBG: Recent Labs  Lab 02/26/17 1151 02/26/17 1545 02/26/17 2043 02/26/17 2355 02/27/17 0407  GLUCAP 226* 106* 112* 98 107*   D-Dimer No results for input(s): DDIMER in the last 72 hours. Hgb A1c No results for input(s): HGBA1C in the last 72  hours. Lipid Profile No results for input(s): CHOL, HDL, LDLCALC, TRIG, CHOLHDL, LDLDIRECT in the last 72 hours. Thyroid function studies No results for input(s): TSH, T4TOTAL, T3FREE, THYROIDAB in the last 72 hours.  Invalid input(s): FREET3 Anemia work up No results for input(s): VITAMINB12, FOLATE, FERRITIN, TIBC, IRON, RETICCTPCT in the last 72 hours. Urinalysis    Component Value Date/Time   COLORURINE YELLOW 06/10/2008 0151   APPEARANCEUR TURBID (A) 06/10/2008 0151   LABSPEC 1.014 06/10/2008 0151   PHURINE 6.0 06/10/2008 0151   GLUCOSEU NEGATIVE 06/10/2008 0151   HGBUR NEGATIVE 06/10/2008 0151   BILIRUBINUR NEGATIVE 06/10/2008 0151   KETONESUR NEGATIVE 06/10/2008 0151   PROTEINUR NEGATIVE 06/10/2008 0151   UROBILINOGEN 0.2 06/10/2008 0151   NITRITE NEGATIVE 06/10/2008 0151   LEUKOCYTESUR NEGATIVE 06/10/2008 0151  Sepsis Labs Invalid input(s): PROCALCITONIN,  WBC,  LACTICIDVEN Microbiology Recent Results (from the past 240 hour(s))  Culture, blood (routine x 2)     Status: None (Preliminary result)   Collection Time: 02/24/17  8:41 PM  Result Value Ref Range Status   Specimen Description BLOOD RIGHT FOREARM  Final   Special Requests   Final    BOTTLES DRAWN AEROBIC ONLY Blood Culture adequate volume   Culture   Final    NO GROWTH 4 DAYS Performed at [facility], [address], [place], [place] [address]    Report Status PENDING  Incomplete  Culture, blood (routine x 2)     Status: None (Preliminary result)   Collection Time: 02/24/17  8:41 PM  Result Value Ref Range Status   Specimen Description BLOOD RIGHT ANTECUBITAL  Final   Special Requests   Final    BOTTLES DRAWN AEROBIC ONLY Blood Culture adequate volume   Culture   Final    NO GROWTH 4 DAYS Performed at [facility], [place] [address] [place], [place] [address]    Report Status PENDING  Incomplete  Surgical PCR screen     Status: None   Collection Time: 02/25/17  9:18 AM  Result Value Ref  Range Status   MRSA, PCR NEGATIVE NEGATIVE Final   Staphylococcus aureus NEGATIVE NEGATIVE Final    Comment: (NOTE) The Xpert SA Assay (FDA approved for NASAL specimens in patients 87 years of age and older), is one component of a comprehensive surveillance program. It is not intended to diagnose infection nor to guide or monitor treatment. Performed at [facility], [place] [address], [place], [place] [address]   Anaerobic culture     Status: None (Preliminary result)   Collection Time: 02/25/17 11:22 AM  Result Value Ref Range Status   Specimen Description BRONCHIAL BRUSHING  Final   Special Requests LEFT BRONCHIAL WASHINGS  Final   Culture   Final    NO GROWTH 2 DAYS NO ANAEROBES ISOLATED; CULTURE IN PROGRESS FOR 5 DAYS Performed at [facility], [place] [address] [place], [place] [address]    Report Status PENDING  Incomplete  Culture, respiratory (NON-Expectorated)     Status: None   Collection Time: 02/25/17 11:22 AM  Result Value Ref Range Status   Specimen Description BRONCHIAL WASHINGS  Final   Special Requests LEFT BRONCHIAL BRUSHING  Final   Gram Stain   Final    RARE WBC PRESENT,BOTH PMN AND MONONUCLEAR NO ORGANISMS SEEN    Culture   Final    NO GROWTH 2 DAYS Performed at [facility], [place] [address] [place], [place] [address]    Report Status 02/28/2017 FINAL  Final  Acid Fast Smear (AFB)     Status: None   Collection Time: 02/25/17 11:22 AM  Result Value Ref Range Status   AFB Specimen Processing Concentration  Final   Acid Fast Smear Negative  Final    Comment: (NOTE) Performed At: [facility] [place], [place] [id number] Rush Farmer MD JG:[id number]    Source (AFB) LEFT  Final    Comment: BRONCHIAL BRUSHING BRONCHIAL WASHINGS Performed at [facility], [place] [address] [place], [place] [address]   Body fluid culture     Status: None   Collection Time: 02/25/17 12:41 PM  Result Value Ref Range Status    Specimen Description PLEURAL  Final   Special Requests LEFT  Final  Gram Stain   Final    RARE WBC PRESENT, PREDOMINANTLY PMN NO ORGANISMS SEEN    Culture   Final    No growth aerobically or anaerobically. Performed at [facility], [place] [address], [place], [place] [address]    Report Status 02/28/2017 FINAL  Final  Fungus Culture With Stain     Status: None (Preliminary result)   Collection Time: 02/25/17 12:41 PM  Result Value Ref Range Status   Fungus Stain Final report  Final    Comment: (NOTE) Performed At: [facility] [place], [place] [id number] Rush Farmer MD [id number]    Fungus (Mycology) Culture PENDING  Incomplete   Fungal Source LEFT  Final    Comment: PLEURAL Performed at [facility], [place] [address] [place], [place] [address]   Acid Fast Smear (AFB)     Status: None   Collection Time: 02/25/17 12:41 PM  Result Value Ref Range Status   AFB Specimen Processing Concentration  Final   Acid Fast Smear Negative  Final    Comment: (NOTE) Performed At: [facility] [place], [place] [id number] Rush Farmer MD QQ:[id number]    Source (AFB) LEFT  Final    Comment: PLEURAL Performed at [facility], [place] [address], [place], [place] [address]   Fungus Culture Result     Status: None   Collection Time: 02/25/17 12:41 PM  Result Value Ref Range Status   Result 1 Comment  Final    Comment: (NOTE) KOH/Calcofluor preparation:  no fungus observed. Performed At: [facility] [place], [place] [id number] Rush Farmer MD UD:[id number] Performed at [facility], [place] [address] [place], [place] [address]      Time coordinating discharge: 30 minutes  SIGNED:  Dessa Phi, DO [facility] Pager [phone]  If 7PM-7AM, please contact night-coverage [email or web address] Password [facility] 03/01/2017, 12:04 PM

## 2017-03-02 LAB — ANAEROBIC CULTURE

## 2017-03-10 ENCOUNTER — Ambulatory Visit (HOSPITAL_COMMUNITY)
Admission: RE | Admit: 2017-03-10 | Discharge: 2017-03-10 | Disposition: A | Discharge status: Home / Self Care | Payer: Commercial Managed Care - PPO | Source: Ambulatory Visit | Attending: Pulmonary Disease | Admitting: Pulmonary Disease

## 2017-03-10 ENCOUNTER — Other Ambulatory Visit (HOSPITAL_COMMUNITY): Payer: Self-pay | Admitting: Pulmonary Disease

## 2017-03-10 DIAGNOSIS — J13 Pneumonia due to Streptococcus pneumoniae: Secondary | ICD-10-CM

## 2017-03-10 DIAGNOSIS — J984 Other disorders of lung: Secondary | ICD-10-CM | POA: Insufficient documentation

## 2017-03-10 DIAGNOSIS — Z8701 Personal history of pneumonia (recurrent): Secondary | ICD-10-CM | POA: Insufficient documentation

## 2017-03-10 DIAGNOSIS — J9811 Atelectasis: Secondary | ICD-10-CM | POA: Diagnosis not present

## 2017-03-10 DIAGNOSIS — J189 Pneumonia, unspecified organism: Secondary | ICD-10-CM | POA: Diagnosis present

## 2017-03-24 ENCOUNTER — Ambulatory Visit
Admission: RE | Admit: 2017-03-24 | Discharge: 2017-03-24 | Disposition: A | Discharge status: Home / Self Care | Payer: Commercial Managed Care - PPO | Source: Ambulatory Visit | Attending: Cardiothoracic Surgery | Admitting: Cardiothoracic Surgery

## 2017-03-24 ENCOUNTER — Other Ambulatory Visit: Payer: Self-pay

## 2017-03-24 ENCOUNTER — Other Ambulatory Visit: Payer: Self-pay | Admitting: Cardiothoracic Surgery

## 2017-03-24 ENCOUNTER — Ambulatory Visit (INDEPENDENT_AMBULATORY_CARE_PROVIDER_SITE_OTHER): Payer: Self-pay | Admitting: Surgical

## 2017-03-24 VITALS — BP 167/90 | HR 100 | Resp 20 | Ht 74.0 in | Wt 199.0 lb

## 2017-03-24 DIAGNOSIS — J869 Pyothorax without fistula: Secondary | ICD-10-CM

## 2017-03-24 NOTE — Progress Notes (Signed)
[address]       [place] [address]             [phone]      Overton L Ju Sedgwick Medical Record #[id number] Date of Birth: 1955-11-14  Referring: Sinda Du, MD Primary Care: Sinda Du, MD Primary Cardiologist: No primary care provider on file.   Chief Complaint:   POST OP FOLLOW UP  DATE OF PROCEDURE:  02/25/2017 DATE OF DISCHARGE:                              OPERATIVE REPORT   PREOPERATIVE DIAGNOSIS:  Left empyema.  POSTOPERATIVE DIAGNOSIS:  Left empyema.  PROCEDURE PERFORMED:  Bronchoscopy, left video-assisted thoracoscopy, minithoracotomy, evacuation of left empyema with decortication and biopsy of pleural plaque on the diaphragm.  SURGEON:  Lanelle Bal, MD.  FIRST ASSISTANT:  Nicholes Rough, [place].    History of Present Illness:    The patient is a 62 year old male status post the above described procedure.  He is seen in the office on today's date and routine postsurgical follow-up.  Since discharge the patient describes a somewhat up-and-down course of feeling well at times and at other times feeling poorly.  He denies fevers, chills or other constitutional symptoms.  He is not having any significant shortness of breath.  He does have some incisional discomfort and the anterior pectoral neuropathic numbness.  He has been seen by primary care on 2 occasions and states that his physician felt as though he was making good overall progress.  He has not had any recent blood work.      Past Medical History:  Diagnosis Date  . Anxiety   . 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 Carotid artery occlusion    right  . Chronic pain disorder    left arm, shoulder and chest ;thoracic syndrome  . Collapsed lung 2010  . Depression   . Fibromyalgia    "left shoulder area" (10/24/2011)  . GERD (gastroesophageal reflux disease)   . History of blood transfusion 2010  . History of thoracic outlet syndrome 2008   rib removed in  2009  . Insomnia due to medical condition   . Neuromuscular disorder ([place])   . Neuropathy of hand    left  . Pneumonia 2010  . PONV (postoperative nausea and vomiting)   . Prostate cancer ([place])   . 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 RSD (reflex sympathetic dystrophy)    "left shoulder and left arm" (10/24/2011)  . Snoring      Social History   Tobacco Use  Smoking Status Former Smoker  . Packs/day: 1.00  . Years: 30.00  . Pack years: 30.00  . Types: Cigarettes  . Last attempt to quit: 10/20/2004  . Years since quitting: 12.4  Smokeless Tobacco Current User  . Types: Snuff    Social History   Substance and Sexual Activity  Alcohol Use No  . Alcohol/week: 0.0 oz     No Known Allergies  Current Outpatient Medications  Medication Sig Dispense Refill  . ALPRAZolam (XANAX) 0.5 MG tablet Take 0.5 mg by mouth 2 (two) times daily.   5  . anastrozole (ARIMIDEX) 1 MG tablet Take 1 mg by mouth 3 (three) times a week. Sunday, Tuesday and Friday    . aspirin EC 81 MG tablet Take 81 mg by mouth  daily.    . cetirizine (ZYRTEC) 10 MG tablet Take 10 mg by mouth daily.    . Cinnamon 500 MG capsule Take 500 mg by mouth daily.     . Coenzyme Q10 200 MG capsule Take 200 mg by mouth daily.    . cyclobenzaprine (FLEXERIL) 5 MG tablet Take 5 mg by mouth 3 (three) times daily as needed for muscle spasms.    . fentaNYL (DURAGESIC - DOSED MCG/HR) 12 MCG/HR Place 12 mcg onto the skin every other day.     Javier Docker Oil 500 MG CAPS Take 500 mg by mouth daily.     . mirtazapine (REMERON) 15 MG tablet Take 15 mg by mouth at bedtime.    . naproxen sodium (ANAPROX) 220 MG tablet Take 220 mg by mouth daily as needed (for pain or headache).     . pregabalin (LYRICA) 75 MG capsule Take 75-150 mg by mouth See admin instructions. Take 150 mg by mouth in the morning and take 75 mg by mouth at supper    . Probiotic Product (PROBIOTIC DAILY PO) Take 1 capsule by mouth daily.     .  RABEprazole (ACIPHEX) 20 MG tablet Take 20 mg by mouth daily.    . SUPER B COMPLEX/C PO Take 1 tablet by mouth daily.    . tamsulosin (FLOMAX) 0.4 MG CAPS capsule Take 0.4 mg by mouth daily.   11  . traZODone (DESYREL) 50 MG tablet Take 50 mg by mouth at bedtime.   1   No current facility-administered medications for this visit.        Physical Exam: BP (!) 167/90   Pulse 100   Resp 20   Ht 6\' 2"  (1.88 m)   Wt 199 lb (90.3 kg)   SpO2 98% Comment: RA  BMI 25.55 kg/m   General appearance: alert, cooperative and no distress Heart: regular rate and rhythm Lungs: clear to auscultation bilaterally Abdomen: soft, non-tender; bowel sounds normal; no masses,  no organomegaly Extremities: extremities normal, atraumatic, no cyanosis or edema Wound: Incision is noted to be healing well without evidence of infection.   Diagnostic Studies & Laboratory data:     Recent Radiology Findings:   Dg Chest 2 View  Result Date: 03/24/2017 CLINICAL DATA:  62 year old male status post left side VATS in February for left empyema drainage, decortication, biopsy of diaphragmatic plaque. EXAM: CHEST - 2 VIEW COMPARISON:  Postoperative radiographs 03/10/2017 and earlier. FINDINGS: Residual blunting of the costophrenic angles without new or increased pleural fluid. No pneumothorax. No pulmonary edema or confluent pulmonary opacity. Normal cardiac size and mediastinal contours. Visualized tracheal air column is within normal limits. Prior cervical ACDF. No acute osseous abnormality identified. Negative visible bowel gas pattern. IMPRESSION: Satisfactory appearance status post left VATS. Stable residual pleural thickening or trace pleural fluid in both costophrenic angles. No increased effusion or new pulmonary opacity. Electronically Signed   By: Genevie Ann M.D.   On: 03/24/2017 14:31      Recent Lab Findings: Lab Results  Component Value Date   WBC 10.5 02/28/2017   HGB 10.8 (L) 02/28/2017   HCT 32.7 (L)  02/28/2017   PLT 210 02/28/2017   GLUCOSE 117 (H) 02/28/2017   ALT 21 02/27/2017   AST 22 02/27/2017   NA 141 02/28/2017   K 4.1 02/28/2017   CL 103 02/28/2017   CREATININE 1.31 (H) 02/28/2017   BUN 17 02/28/2017   CO2 25 02/28/2017   INR 1.36 02/25/2017  Assessment / Plan: The patient appears to be recovering well from his VATS/decortication for empyema.  His progress appears to be somewhat variable but in general the trend is towards improving wellness.  I encouraged him to be patient with his recovery and try to be as active as able as well as eat a nutritious diet.  It is noted he was hypertensive in the office but he states he checks his blood pressure at home and previous office appointments with his primary care physician have shown normal blood pressures.  We discussed following this closely over time and if he continues to have elevated blood pressure he should follow-up with his primary care physician for further management.  We will see him again in the office in 1 month with a repeat chest x-ray.          John Giovanni, PA-C 03/24/2017 3:02 PM

## 2017-03-24 NOTE — Patient Instructions (Signed)
Given verbal instruction on activity progression

## 2017-03-28 LAB — FUNGUS CULTURE WITH STAIN

## 2017-03-28 LAB — FUNGAL ORGANISM REFLEX

## 2017-03-28 LAB — FUNGUS CULTURE RESULT

## 2017-04-02 LAB — FUNGAL ORGANISM REFLEX

## 2017-04-02 LAB — FUNGUS CULTURE WITH STAIN

## 2017-04-02 LAB — FUNGUS CULTURE RESULT

## 2017-04-08 LAB — ACID FAST CULTURE WITH REFLEXED SENSITIVITIES (MYCOBACTERIA): Acid Fast Culture: NEGATIVE

## 2017-04-18 LAB — ACID FAST CULTURE WITH REFLEXED SENSITIVITIES (MYCOBACTERIA): Acid Fast Culture: NEGATIVE

## 2017-04-23 ENCOUNTER — Other Ambulatory Visit: Payer: Self-pay | Admitting: Cardiothoracic Surgery

## 2017-04-23 DIAGNOSIS — J869 Pyothorax without fistula: Secondary | ICD-10-CM

## 2017-04-24 ENCOUNTER — Encounter: Payer: Self-pay | Admitting: Cardiothoracic Surgery

## 2017-04-24 ENCOUNTER — Ambulatory Visit (INDEPENDENT_AMBULATORY_CARE_PROVIDER_SITE_OTHER): Payer: Self-pay | Admitting: Cardiothoracic Surgery

## 2017-04-24 ENCOUNTER — Other Ambulatory Visit: Payer: Self-pay

## 2017-04-24 ENCOUNTER — Ambulatory Visit
Admission: RE | Admit: 2017-04-24 | Discharge: 2017-04-24 | Disposition: A | Discharge status: Home / Self Care | Payer: Commercial Managed Care - PPO | Source: Ambulatory Visit | Attending: Cardiothoracic Surgery | Admitting: Cardiothoracic Surgery

## 2017-04-24 VITALS — BP 156/84 | HR 82 | Resp 16 | Ht 74.0 in | Wt 198.0 lb

## 2017-04-24 DIAGNOSIS — J869 Pyothorax without fistula: Secondary | ICD-10-CM

## 2017-04-24 DIAGNOSIS — Z09 Encounter for follow-up examination after completed treatment for conditions other than malignant neoplasm: Secondary | ICD-10-CM

## 2017-04-24 NOTE — Progress Notes (Signed)
[address]       [place] [address]             [phone]      Lillian L Steidle Tolstoy Medical Record #[id number] Date of Birth: Sep 19, 1955  Referring: Sinda Du, MD Primary Care: Sinda Du, MD Primary Cardiologist: No primary care provider on file.   Chief Complaint:   POST OP FOLLOW UP  DATE OF PROCEDURE:  02/25/2017  OPERATIVE REPOR PREOPERATIVE DIAGNOSIS:  Left empyema. POSTOPERATIVE DIAGNOSIS:  Left empyema. PROCEDURE PERFORMED:  Bronchoscopy, left video-assisted thoracoscopy, minithoracotomy, evacuation of left empyema with decortication and biopsy of pleural plaque on the diaphragm. SURGEON:  Lanelle Bal, MD.    History of Present Illness:     The patient is a 62 year old male status post drainage of left empyema.  He seen in the office today in follow-up postop with a follow-up chest x-ray.  He notes incisional discomfort is significantly improved   Past Medical History:  Diagnosis Date  . Anxiety   . 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 Carotid artery occlusion    right  . Chronic pain disorder    left arm, shoulder and chest ;thoracic syndrome  . Collapsed lung 2010  . Depression   . Fibromyalgia    "left shoulder area" (10/24/2011)  . GERD (gastroesophageal reflux disease)   . History of blood transfusion 2010  . History of thoracic outlet syndrome 2008   rib removed in 2009  . Insomnia due to medical condition   . Neuromuscular disorder ([place])   . Neuropathy of hand    left  . Pneumonia 2010  . PONV (postoperative nausea and vomiting)   . Prostate cancer ([place])   . 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 RSD (reflex sympathetic dystrophy)    "left shoulder and left arm" (10/24/2011)  . Snoring      Social History   Tobacco Use  Smoking Status Former Smoker  . Packs/day: 1.00  . Years: 30.00  . Pack years: 30.00  . Types: Cigarettes  . Last attempt to quit: 10/20/2004  . Years since  quitting: 12.5  Smokeless Tobacco Current User  . Types: Snuff    Social History   Substance and Sexual Activity  Alcohol Use No  . Alcohol/week: 0.0 oz     No Known Allergies  Current Outpatient Medications  Medication Sig Dispense Refill  . ALPRAZolam (XANAX) 0.5 MG tablet Take 0.5 mg by mouth 2 (two) times daily.   5  . anastrozole (ARIMIDEX) 1 MG tablet Take 1 mg by mouth 3 (three) times a week. Sunday, Tuesday and Friday    . aspirin EC 81 MG tablet Take 81 mg by mouth daily.    . cetirizine (ZYRTEC) 10 MG tablet Take 10 mg by mouth daily.    . Cinnamon 500 MG capsule Take 500 mg by mouth daily.     . Coenzyme Q10 200 MG capsule Take 200 mg by mouth daily.    . cyclobenzaprine (FLEXERIL) 5 MG tablet Take 5 mg by mouth 3 (three) times daily as needed for muscle spasms.    . fentaNYL (DURAGESIC - DOSED MCG/HR) 12 MCG/HR Place 12 mcg onto the skin every other day.     Javier Docker Oil 500 MG CAPS Take 500 mg by mouth daily.     . mirtazapine (REMERON) 15 MG tablet Take 15 mg by mouth at bedtime.    Marland Kitchen  naproxen sodium (ANAPROX) 220 MG tablet Take 220 mg by mouth daily as needed (for pain or headache).     . pregabalin (LYRICA) 75 MG capsule Take 75-150 mg by mouth See admin instructions. Take 150 mg by mouth in the morning and take 75 mg by mouth at supper    . Probiotic Product (PROBIOTIC DAILY PO) Take 1 capsule by mouth daily.     . RABEprazole (ACIPHEX) 20 MG tablet Take 20 mg by mouth daily.    . SUPER B COMPLEX/C PO Take 1 tablet by mouth daily.    . tamsulosin (FLOMAX) 0.4 MG CAPS capsule Take 0.4 mg by mouth daily.   11  . traZODone (DESYREL) 50 MG tablet Take 50 mg by mouth at bedtime.   1   No current facility-administered medications for this visit.        Physical Exam: BP (!) 156/84 (BP Location: Right Arm, Patient Position: Sitting, Cuff Size: Large)   Pulse 82   Resp 16   Ht 6\' 2"  (1.88 m)   Wt 198 lb (89.8 kg)   SpO2 98% Comment: ON RA  BMI 25.42 kg/m    General appearance: alert, cooperative and no distress Heart: regular rate and rhythm Lungs: clear to auscultation bilaterally Abdomen: soft, non-tender; bowel sounds normal; no masses,  no organomegaly Extremities: extremities normal, atraumatic, no cyanosis or edema Wound: Incision is noted to be healing well without evidence of infection.   Diagnostic Studies & Laboratory data:     Recent Radiology Findings:   Dg Chest 2 View  Result Date: 04/24/2017 CLINICAL DATA:  Recent VATS procedure on the left EXAM: CHEST - 2 VIEW COMPARISON:  March 24, 2017 FINDINGS: There is slight scarring in the left base. No edema or consolidation. Heart size and pulmonary vascularity are normal. No pneumothorax. No adenopathy. No bone lesions. Postoperative change in lower cervical spine noted. IMPRESSION: Mild scarring left base. No edema or consolidation. Cardiac silhouette within normal limits. Electronically Signed   By: Lowella Grip III M.D.   On: 04/24/2017 09:56   I have independently reviewed the above radiology studies  and reviewed the findings with the patient.    Recent Lab Findings: Lab Results  Component Value Date   WBC 10.5 02/28/2017   HGB 10.8 (L) 02/28/2017   HCT 32.7 (L) 02/28/2017   PLT 210 02/28/2017   GLUCOSE 117 (H) 02/28/2017   ALT 21 02/27/2017   AST 22 02/27/2017   NA 141 02/28/2017   K 4.1 02/28/2017   CL 103 02/28/2017   CREATININE 1.31 (H) 02/28/2017   BUN 17 02/28/2017   CO2 25 02/28/2017   INR 1.36 02/25/2017   Results for orders placed or performed during the hospital encounter of 02/24/17  Culture, blood (routine x 2)     Status: None   Collection Time: 02/24/17  8:41 PM  Result Value Ref Range Status   Specimen Description BLOOD RIGHT FOREARM  Final   Special Requests   Final    BOTTLES DRAWN AEROBIC ONLY Blood Culture adequate volume   Culture   Final    NO GROWTH 5 DAYS Performed at [facility], [place] [address], [place], [place]  [address]    Report Status 03/01/2017 FINAL  Final  Culture, blood (routine x 2)     Status: None   Collection Time: 02/24/17  8:41 PM  Result Value Ref Range Status   Specimen Description BLOOD RIGHT ANTECUBITAL  Final   Special Requests  Final    BOTTLES DRAWN AEROBIC ONLY Blood Culture adequate volume   Culture   Final    NO GROWTH 5 DAYS Performed at [facility], [place] [address] [place], [place] [address]    Report Status 03/01/2017 FINAL  Final  Surgical PCR screen     Status: None   Collection Time: 02/25/17  9:18 AM  Result Value Ref Range Status   MRSA, PCR NEGATIVE NEGATIVE Final   Staphylococcus aureus NEGATIVE NEGATIVE Final    Comment: (NOTE) The Xpert SA Assay (FDA approved for NASAL specimens in patients 27 years of age and older), is one component of a comprehensive surveillance program. It is not intended to diagnose infection nor to guide or monitor treatment. Performed at [facility], [place] [address], [place], [place] [address]   Anaerobic culture     Status: None   Collection Time: 02/25/17 11:22 AM  Result Value Ref Range Status   Specimen Description BRONCHIAL BRUSHING  Final   Special Requests LEFT BRONCHIAL WASHINGS  Final   Culture   Final    NO ANAEROBES ISOLATED Performed at [facility], [place] [address], [place], [place] [address]    Report Status 03/02/2017 FINAL  Final  Fungus Culture With Stain     Status: None   Collection Time: 02/25/17 11:22 AM  Result Value Ref Range Status   Fungus Stain Final report  Final   Fungus (Mycology) Culture Final report  Final    Comment: (NOTE) Performed At: [facility] [place], [place] [id number] Rush Farmer MD [id number]    Fungal Source LEFT  Final    Comment: BRONCHIAL BRUSHING BRONCHIAL WASHINGS Performed at [facility], [place] [address], [place], [place] [address]   Culture, respiratory (NON-Expectorated)     Status: None   Collection Time:  02/25/17 11:22 AM  Result Value Ref Range Status   Specimen Description BRONCHIAL WASHINGS  Final   Special Requests LEFT BRONCHIAL BRUSHING  Final   Gram Stain   Final    RARE WBC PRESENT,BOTH PMN AND MONONUCLEAR NO ORGANISMS SEEN    Culture   Final    NO GROWTH 2 DAYS Performed at [facility], [place] [address] [place], [place] [address]    Report Status 02/28/2017 FINAL  Final  Acid Fast Culture with reflexed sensitivities     Status: None   Collection Time: 02/25/17 11:22 AM  Result Value Ref Range Status   Acid Fast Culture Negative  Final    Comment: (NOTE) No acid fast bacilli isolated after 6 weeks. Performed At: [facility] [place], [place] [id number] Rush Farmer MD [id number]    Source of Sample LEFT  Final    Comment: BRONCHIAL BRUSHING BRONCHIAL WASHINGS Performed at [facility], [place] [address] [place], [place] [address]   Acid Fast Smear (AFB)     Status: None   Collection Time: 02/25/17 11:22 AM  Result Value Ref Range Status   AFB Specimen Processing Concentration  Final   Acid Fast Smear Negative  Final    Comment: (NOTE) Performed At: [facility] [place], [place] [id number] Rush Farmer MD VQ:[id number]    Source (AFB) LEFT  Final    Comment: BRONCHIAL BRUSHING BRONCHIAL WASHINGS Performed at [facility], [place] [address] [place], [place] [address]   Fungus Culture Result     Status: None   Collection Time: 02/25/17  11:22 AM  Result Value Ref Range Status   Result 1 Comment  Final    Comment: (NOTE) KOH/Calcofluor preparation:  no fungus observed. Performed At: [facility] [place], [place] [id number] Rush Farmer MD DJ:[id number] Performed at [facility], [place] [address] [place], [place] [address]   Fungal organism reflex     Status: None   Collection Time: 02/25/17 11:22 AM  Result Value Ref Range Status   Fungal result 1 Comment   Final    Comment: (NOTE) No yeast or mold isolated after 4 weeks. Performed At: [name] [place], [place] [id number] Rush Farmer MD [id number] Performed at [facility], [place] [address], [place], [place] [address]   Body fluid culture     Status: None   Collection Time: 02/25/17 12:41 PM  Result Value Ref Range Status   Specimen Description PLEURAL  Final   Special Requests LEFT  Final   Gram Stain   Final    RARE WBC PRESENT, PREDOMINANTLY PMN NO ORGANISMS SEEN    Culture   Final    No growth aerobically or anaerobically. Performed at [facility], [place] [address] [place], [place] [address]    Report Status 02/28/2017 FINAL  Final  Fungus Culture With Stain     Status: None   Collection Time: 02/25/17 12:41 PM  Result Value Ref Range Status   Fungus Stain Final report  Final   Fungus (Mycology) Culture Final report  Final    Comment: (NOTE) Performed At: [facility] [place], [place] [id number] Rush Farmer MD [id number]    Fungal Source LEFT  Final    Comment: PLEURAL Performed at [facility], [place] [address] [place], [place] [address]   Acid Fast Culture with reflexed sensitivities     Status: None   Collection Time: 02/25/17 12:41 PM  Result Value Ref Range Status   Acid Fast Culture Negative  Final    Comment: (NOTE) No acid fast bacilli isolated after 6 weeks. Performed At: [facility] [place], [place] [id number] Rush Farmer MD YK:[id number]    Source of Sample LEFT  Final    Comment: PLEURAL Performed at [facility], [place] [address], [place], [place] [address]   Acid Fast Smear (AFB)     Status: None   Collection Time: 02/25/17 12:41 PM  Result Value Ref Range Status   AFB Specimen Processing Concentration  Final   Acid Fast Smear Negative  Final    Comment: (NOTE) Performed At: [facility] [place], [place]  [id number] Rush Farmer MD MA:[id number]    Source (AFB) LEFT  Final    Comment: PLEURAL Performed at [facility], [place] [address], [place], [place] [address]   Fungus Culture Result     Status: None   Collection Time: 02/25/17 12:41 PM  Result Value Ref Range Status   Result 1 Comment  Final    Comment: (NOTE) KOH/Calcofluor preparation:  no fungus observed. Performed At: [facility] [place], [place] [id number] Rush Farmer MD [id number] Performed at [facility], [place] [address], [place], [place] [address]   Fungal organism reflex     Status: None   Collection Time: 02/25/17 12:41 PM  Result Value Ref Range Status   Fungal result 1 Comment  Final    Comment: (NOTE) No yeast or mold isolated after 4  weeks. Performed At: [facility] [place], [place] [id number] Rush Farmer MD HW:[id number] Performed at [facility], [place] [address], [place], [place] [address]       Assessment / Plan:   Patient doing well following left decortication for empyema, chest x-ray is improved. Today all cultures for AFB and fungal have been negative. We will plan to see the patient back as needed.    Grace Isaac, MD 04/24/2017 10:36 AM

## 2017-05-14 ENCOUNTER — Encounter (INDEPENDENT_AMBULATORY_CARE_PROVIDER_SITE_OTHER): Payer: Self-pay | Admitting: *Deleted

## 2017-07-02 ENCOUNTER — Other Ambulatory Visit (INDEPENDENT_AMBULATORY_CARE_PROVIDER_SITE_OTHER): Payer: Self-pay | Admitting: *Deleted

## 2017-07-02 DIAGNOSIS — Z1211 Encounter for screening for malignant neoplasm of colon: Secondary | ICD-10-CM

## 2017-07-03 ENCOUNTER — Encounter (INDEPENDENT_AMBULATORY_CARE_PROVIDER_SITE_OTHER): Payer: Self-pay | Admitting: *Deleted

## 2017-07-03 ENCOUNTER — Telehealth (INDEPENDENT_AMBULATORY_CARE_PROVIDER_SITE_OTHER): Payer: Self-pay | Admitting: *Deleted

## 2017-07-03 DIAGNOSIS — Z1211 Encounter for screening for malignant neoplasm of colon: Secondary | ICD-10-CM | POA: Insufficient documentation

## 2017-07-03 NOTE — Telephone Encounter (Signed)
Patient needs osmo pills

## 2017-07-04 ENCOUNTER — Telehealth (INDEPENDENT_AMBULATORY_CARE_PROVIDER_SITE_OTHER): Payer: Self-pay | Admitting: *Deleted

## 2017-07-04 NOTE — Telephone Encounter (Signed)
Referring MD/PCP: hawkins   Procedure: tcs w propofol  Reason/Indication:  screening  Has patient had this procedure before?  no  If so, when, by whom and where?    Is there a family history of colon cancer?  no  Who?  What age when diagnosed?    Is patient diabetic?   no      Does patient have prosthetic heart valve or mechanical valve?  no  Do you have a pacemaker?  no  Has patient ever had endocarditis? no  Has patient had joint replacement within last 12 months?  no  Is patient constipated or do they take laxatives? no  Does patient have a history of alcohol/drug use?  no  Is patient on blood thinner such as Coumadin, Plavix and/or Aspirin? yes  Medications: asa 81 mg daily, lyrica 75 mg tid, rabeprazole 20 mg daily, tamuslosin 0.4 mg bid, mirtazapine 15 mg at bedtime, trazodone 50 mg 1-2 tabs at bedtime, alprazolam 0.5 mg 1 tab prn qid, clomiphene 50 mg daily, cyclobenzaprine 5 mg 1 tab tid prn, omega 3 daily, naproxen 220 mg daily, super b complex w c daily, zyrtec 10 mg daily, probiotic daily, cinnamon 1000 mg dialy  Allergies: nkda  Medication Adjustment per Dr Lindi Adie, NP: asa 2 days  Procedure date & time: 08/01/17 at 11:15 preop 7/15 @ 9

## 2017-07-07 MED ORDER — SOD PHOS MONO-SOD PHOS DIBASIC 1.102-0.398 G PO TABS: 32.0000 | ORAL_TABLET | Freq: Once | ORAL | 0 refills | Status: AC

## 2017-07-07 NOTE — Telephone Encounter (Signed)
agree

## 2017-07-25 NOTE — Patient Instructions (Signed)
Kenneth [name]  07/25/2017     @PREFPERIOPPHARMACY @   Your procedure is scheduled on 08/01/2017.  Report to [facility] at 9:00 A.M.  Call this number if you have problems the morning of surgery:  [phone]   Remember:  Do not eat or drink after midnight.      Take these medicines the morning of surgery with A SIP OF WATER  Xanax, Zyrtec, Fentanyl patch, Remeron, Nasonex, Lyrica, Aciphex, Flomax    Do not wear jewelry, make-up or nail polish.  Do not wear lotions, powders, or perfumes, or deodorant.  Do not shave 48 hours prior to surgery.  Men may shave face and neck.  Do not bring valuables to the hospital.  [facility] is not responsible for any belongings or valuables.  Contacts, dentures or bridgework may not be worn into surgery.  Leave your suitcase in the car.  After surgery it may be brought to your room.  For patients admitted to the hospital, discharge time will be determined by your treatment team.  Patients discharged the day of surgery will not be allowed to drive home.    Please read over the following fact sheets that you were given. Anesthesia Post-op Instructions     PATIENT INSTRUCTIONS POST-ANESTHESIA  IMMEDIATELY FOLLOWING SURGERY:  Do not drive or operate machinery for the first twenty four hours after surgery.  Do not make any important decisions for twenty four hours after surgery or while taking narcotic pain medications or sedatives.  If you develop intractable nausea and vomiting or a severe headache please notify your doctor immediately.  FOLLOW-UP:  Please make an appointment with your surgeon as instructed. You do not need to follow up with anesthesia unless specifically instructed to do so.  WOUND CARE INSTRUCTIONS (if applicable):  Keep a dry clean dressing on the anesthesia/puncture wound site if there is drainage.  Once the wound has quit draining you may leave it open to air.  Generally you should leave the bandage intact for twenty four  hours unless there is drainage.  If the epidural site drains for more than 36-48 hours please call the anesthesia department.  QUESTIONS?:  Please feel free to call your physician or the hospital operator if you have any questions, and they will be happy to assist you.      Colonoscopy, Adult A colonoscopy is an exam to look at the entire large intestine. During the exam, a lubricated, bendable tube is inserted into the anus and then passed into the rectum, colon, and other parts of the large intestine. A colonoscopy is often done as a part of normal colorectal screening or in response to certain symptoms, such as anemia, persistent diarrhea, abdominal pain, and blood in the stool. The exam can help screen for and diagnose medical problems, including:  Tumors.  Polyps.  Inflammation.  Areas of bleeding.  Tell a health care provider about:  Any allergies you have.  All medicines you are taking, including vitamins, herbs, eye drops, creams, and over-the-counter medicines.  Any problems you or family members have had with anesthetic medicines.  Any blood disorders you have.  Any surgeries you have had.  Any medical conditions you have.  Any problems you have had passing stool. What are the risks? Generally, this is a safe procedure. However, problems may occur, including:  Bleeding.  A tear in the intestine.  A reaction to medicines given during the exam.  Infection (rare).  What happens before the procedure? Eating  and drinking restrictions Follow instructions from your health care provider about eating and drinking, which may include:  A few days before the procedure - follow a low-fiber diet. Avoid nuts, seeds, dried fruit, raw fruits, and vegetables.  1-3 days before the procedure - follow a clear liquid diet. Drink only clear liquids, such as clear broth or bouillon, black coffee or tea, clear juice, clear soft drinks or sports drinks, gelatin dessert, and  popsicles. Avoid any liquids that contain red or purple dye.  On the day of the procedure - do not eat or drink anything during the 2 hours before the procedure, or within the time period that your health care provider recommends.  Bowel prep If you were prescribed an oral bowel prep to clean out your colon:  Take it as told by your health care provider. Starting the day before your procedure, you will need to drink a large amount of medicated liquid. The liquid will cause you to have multiple loose stools until your stool is almost clear or light green.  If your skin or anus gets irritated from diarrhea, you may use these to relieve the irritation: ? Medicated wipes, such as adult wet wipes with aloe and vitamin E. ? A skin soothing-product like petroleum jelly.  If you vomit while drinking the bowel prep, take a break for up to 60 minutes and then begin the bowel prep again. If vomiting continues and you cannot take the bowel prep without vomiting, call your health care provider.  General instructions  Ask your health care provider about changing or stopping your regular medicines. This is especially important if you are taking diabetes medicines or blood thinners.  Plan to have someone take you home from the hospital or clinic. What happens during the procedure?  An IV tube may be inserted into one of your veins.  You will be given medicine to help you relax (sedative).  To reduce your risk of infection: ? Your health care team will wash or sanitize their hands. ? Your anal area will be washed with soap.  You will be asked to lie on your side with your knees bent.  Your health care provider will lubricate a long, thin, flexible tube. The tube will have a camera and a light on the end.  The tube will be inserted into your anus.  The tube will be gently eased through your rectum and colon.  Air will be delivered into your colon to keep it open. You may feel some pressure or  cramping.  The camera will be used to take images during the procedure.  A small tissue sample may be removed from your body to be examined under a microscope (biopsy). If any potential problems are found, the tissue will be sent to a lab for testing.  If small polyps are found, your health care provider may remove them and have them checked for cancer cells.  The tube that was inserted into your anus will be slowly removed. The procedure may vary among health care providers and hospitals. What happens after the procedure?  Your blood pressure, heart rate, breathing rate, and blood oxygen level will be monitored until the medicines you were given have worn off.  Do not drive for 24 hours after the exam.  You may have a small amount of blood in your stool.  You may pass gas and have mild abdominal cramping or bloating due to the air that was used to inflate your colon during the  exam.  It is up to you to get the results of your procedure. Ask your health care provider, or the department performing the procedure, when your results will be ready. This information is not intended to replace advice given to you by your health care provider. Make sure you discuss any questions you have with your health care provider. Document Released: 12/29/1999 Document Revised: 11/01/2015 Document Reviewed: 03/14/2015 Elsevier Interactive Patient Education  2018 [employer].

## 2017-07-28 ENCOUNTER — Encounter (HOSPITAL_COMMUNITY)
Admission: RE | Admit: 2017-07-28 | Discharge: 2017-07-28 | Disposition: A | Discharge status: Home / Self Care | Payer: Commercial Managed Care - PPO | Source: Ambulatory Visit | Attending: Internal Medicine | Admitting: Internal Medicine

## 2017-07-28 ENCOUNTER — Other Ambulatory Visit: Payer: Self-pay

## 2017-07-28 ENCOUNTER — Encounter (HOSPITAL_COMMUNITY): Payer: Self-pay

## 2017-07-28 DIAGNOSIS — Z1211 Encounter for screening for malignant neoplasm of colon: Secondary | ICD-10-CM | POA: Diagnosis not present

## 2017-07-28 DIAGNOSIS — Z01810 Encounter for preprocedural cardiovascular examination: Secondary | ICD-10-CM | POA: Insufficient documentation

## 2017-07-28 DIAGNOSIS — Z01818 Encounter for other preprocedural examination: Secondary | ICD-10-CM | POA: Diagnosis not present

## 2017-07-28 HISTORY — DX: Sleep apnea, unspecified: G47.30

## 2017-07-28 LAB — BASIC METABOLIC PANEL
ANION GAP: 10 (ref 5–15)
BUN: 23 mg/dL (ref 8–23)
CALCIUM: 8.8 mg/dL — AB (ref 8.9–10.3)
CO2: 25 mmol/L (ref 22–32)
Chloride: 107 mmol/L (ref 98–111)
Creatinine, Ser: 1.18 mg/dL (ref 0.61–1.24)
GFR calc Af Amer: >60 mL/min (ref >60)
GLUCOSE: 118 mg/dL — AB (ref 70–99)
Potassium: 4.3 mmol/L (ref 3.5–5.1)
Sodium: 142 mmol/L (ref 135–145)

## 2017-07-28 LAB — CBC
HCT: 39.9 % (ref 39.0–52.0)
Hemoglobin: 13.8 g/dL (ref 13.0–17.0)
MCH: 31.5 pg (ref 26.0–34.0)
MCHC: 34.6 g/dL (ref 30.0–36.0)
MCV: 91.1 fL (ref 78.0–100.0)
PLATELETS: 109 10*3/uL — AB (ref 150–400)
RBC: 4.38 MIL/uL (ref 4.22–5.81)
RDW: 13.7 % (ref 11.5–15.5)
WBC: 7.8 10*3/uL (ref 4.0–10.5)

## 2017-07-30 ENCOUNTER — Other Ambulatory Visit (INDEPENDENT_AMBULATORY_CARE_PROVIDER_SITE_OTHER): Payer: Self-pay | Admitting: *Deleted

## 2017-07-30 DIAGNOSIS — D696 Thrombocytopenia, unspecified: Secondary | ICD-10-CM

## 2017-08-01 ENCOUNTER — Ambulatory Visit (HOSPITAL_COMMUNITY): Payer: Commercial Managed Care - PPO | Admitting: Anesthesiology

## 2017-08-01 ENCOUNTER — Encounter (HOSPITAL_COMMUNITY)
Admission: RE | Disposition: A | Discharge status: Home / Self Care | Payer: Self-pay | Source: Ambulatory Visit | Attending: Internal Medicine

## 2017-08-01 ENCOUNTER — Ambulatory Visit (HOSPITAL_COMMUNITY)
Admission: RE | Admit: 2017-08-01 | Discharge: 2017-08-01 | Disposition: A | Discharge status: Home / Self Care | Payer: Commercial Managed Care - PPO | Source: Ambulatory Visit | Attending: Internal Medicine | Admitting: Internal Medicine

## 2017-08-01 DIAGNOSIS — Z87891 Personal history of nicotine dependence: Secondary | ICD-10-CM | POA: Insufficient documentation

## 2017-08-01 DIAGNOSIS — F419 Anxiety disorder, unspecified: Secondary | ICD-10-CM | POA: Insufficient documentation

## 2017-08-01 DIAGNOSIS — G8929 Other chronic pain: Secondary | ICD-10-CM | POA: Insufficient documentation

## 2017-08-01 DIAGNOSIS — K5730 Diverticulosis of large intestine without perforation or abscess without bleeding: Secondary | ICD-10-CM | POA: Insufficient documentation

## 2017-08-01 DIAGNOSIS — F329 Major depressive disorder, single episode, unspecified: Secondary | ICD-10-CM | POA: Insufficient documentation

## 2017-08-01 DIAGNOSIS — Z79899 Other long term (current) drug therapy: Secondary | ICD-10-CM | POA: Insufficient documentation

## 2017-08-01 DIAGNOSIS — Z7982 Long term (current) use of aspirin: Secondary | ICD-10-CM | POA: Diagnosis not present

## 2017-08-01 DIAGNOSIS — K644 Residual hemorrhoidal skin tags: Secondary | ICD-10-CM | POA: Insufficient documentation

## 2017-08-01 DIAGNOSIS — Z1211 Encounter for screening for malignant neoplasm of colon: Secondary | ICD-10-CM | POA: Diagnosis present

## 2017-08-01 DIAGNOSIS — Z8546 Personal history of malignant neoplasm of prostate: Secondary | ICD-10-CM | POA: Insufficient documentation

## 2017-08-01 DIAGNOSIS — M797 Fibromyalgia: Secondary | ICD-10-CM | POA: Insufficient documentation

## 2017-08-01 DIAGNOSIS — D122 Benign neoplasm of ascending colon: Secondary | ICD-10-CM | POA: Insufficient documentation

## 2017-08-01 DIAGNOSIS — K219 Gastro-esophageal reflux disease without esophagitis: Secondary | ICD-10-CM | POA: Diagnosis not present

## 2017-08-01 DIAGNOSIS — G4730 Sleep apnea, unspecified: Secondary | ICD-10-CM | POA: Diagnosis not present

## 2017-08-01 HISTORY — PX: POLYPECTOMY: SHX5525

## 2017-08-01 HISTORY — PX: COLONOSCOPY WITH PROPOFOL: SHX5780

## 2017-08-01 SURGERY — COLONOSCOPY WITH PROPOFOL
Anesthesia: General

## 2017-08-01 MED ORDER — ONDANSETRON HCL 4 MG/2ML IJ SOLN
INTRAMUSCULAR | Status: DC | PRN
  Administered 2017-08-01: 4 mg via INTRAVENOUS

## 2017-08-01 MED ORDER — PROPOFOL 10 MG/ML IV BOLUS
INTRAVENOUS | Status: AC
  Filled 2017-08-01: qty 40

## 2017-08-01 MED ORDER — LACTATED RINGERS IV SOLN
INTRAVENOUS | Status: DC | PRN
  Administered 2017-08-01: 10:00:00 via INTRAVENOUS

## 2017-08-01 MED ORDER — HYDROCODONE-ACETAMINOPHEN 7.5-325 MG PO TABS: 1.0000 | ORAL_TABLET | Freq: Once | ORAL | Status: DC | PRN

## 2017-08-01 MED ORDER — PROPOFOL 500 MG/50ML IV EMUL
INTRAVENOUS | Status: DC | PRN
  Administered 2017-08-01: 150 ug/kg/min via INTRAVENOUS
  Administered 2017-08-01: 11:00:00 via INTRAVENOUS

## 2017-08-01 MED ORDER — LACTATED RINGERS IV SOLN: INTRAVENOUS | Status: DC

## 2017-08-01 MED ORDER — FENTANYL CITRATE (PF) 100 MCG/2ML IJ SOLN: 25.0000 ug | INTRAMUSCULAR | Status: DC | PRN

## 2017-08-01 NOTE — Anesthesia Procedure Notes (Signed)
Procedure Name: MAC Date/Time: 08/01/2017 10:39 AM Performed by: Andree Elk Pranavi Aure A, CRNA Pre-anesthesia Checklist: Patient identified, Emergency Drugs available, Suction available, Patient being monitored and Timeout performed Oxygen Delivery Method: Simple face mask

## 2017-08-01 NOTE — H&P (Signed)
Kenneth [name] is an 62 y.o. male.   Chief Complaint: Patient is here for colonoscopy. HPI: Patient is 62 year old Caucasian male with multiple medical problems who is here for screening colonoscopy.  Last exam was normal 11 years ago.  He denies abdominal pain change in bowel habits or rectal bleeding. Last aspirin dose was 4 days ago. Family history is negative for CRC.  Past Medical History:  Diagnosis Date  . Anxiety   . 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 Carotid artery occlusion    right  . Chronic pain disorder    left arm, shoulder and chest ;thoracic syndrome  . Collapsed lung 2010  . Depression   . Fibromyalgia    "left shoulder area" (10/24/2011)  . GERD (gastroesophageal reflux disease)   . History of blood transfusion 2010  . History of thoracic outlet syndrome 2008   rib removed in 2009  . Insomnia due to medical condition   . Neuromuscular disorder ([place])   . Neuropathy of hand    left  . Pneumonia 2010  . PONV (postoperative nausea and vomiting)   . Prostate cancer ([place])   . 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 RSD (reflex sympathetic dystrophy)    "left shoulder and left arm" (10/24/2011)  . Sleep apnea    could not tolerate CPAP, PCP aware  . Snoring     Past Surgical History:  Procedure Laterality Date  . ANTERIOR CERVICAL DECOMP/DISCECTOMY FUSION  10/24/2011  . ANTERIOR CERVICAL DECOMP/DISCECTOMY FUSION  10/24/2011   Procedure: ANTERIOR CERVICAL DECOMPRESSION/DISCECTOMY FUSION 2 LEVELS;  Surgeon: Melina Schools, MD;  Location: [place];  Service: Orthopedics;  Laterality: N/A;  ACDF C3-5 C-ARM SKYTRON TABLE SYNTHES VECTOR TITAN CAGE NEURO MONITORING SYTEM  . BIOPSY N/A 02/25/2017   Procedure: BIOPSY DIAPHRAGMATIC PLAQUE;  Surgeon: Grace Isaac, MD;  Location: [place];  Service: Thoracic;  Laterality: N/A;  . CYSTECTOMY     left hand x 3  . DECORTICATION Left 02/25/2017   Procedure: DECORTICATION;  Surgeon: Grace Isaac, MD;   Location: [place];  Service: Thoracic;  Laterality: Left;  . EMPYEMA DRAINAGE Left 02/25/2017   Procedure: LEFT EMPYEMA DRAINAGE;  Surgeon: Grace Isaac, MD;  Location: [place];  Service: Thoracic;  Laterality: Left;  . EYE SURGERY     lasix  . KNEE ARTHROSCOPY  1987   right  . REFRACTIVE SURGERY  2003   bilaterally  . RIB RESECTION  2009   "left anterior" (10/24/2011)  . VASCULAR SURGERY     to remove  blood clots  . VIDEO ASSISTED THORACOSCOPY (VATS)/EMPYEMA Left 02/25/2017   Procedure: LEFT VIDEO ASSISTED THORACOSCOPY (VATS);  Surgeon: Grace Isaac, MD;  Location: [place];  Service: Thoracic;  Laterality: Left;  Marland Kitchen VIDEO BRONCHOSCOPY N/A 02/25/2017   Procedure: VIDEO BRONCHOSCOPY;  Surgeon: Grace Isaac, MD;  Location: [facility];  Service: Thoracic;  Laterality: N/A;    Family History  Problem Relation Age of Onset  . Diabetes Mother    Social History:  reports that he quit smoking about 12 years ago. His smoking use included cigarettes. He has a 30.00 pack-year smoking history. His smokeless tobacco use includes chew. He reports that he does not drink alcohol or use drugs.  Allergies: No Known Allergies  Medications Prior to Admission  Medication Sig Dispense Refill  . ALPRAZolam (XANAX) 0.5 MG tablet Take 0.5 mg by mouth 2 (two) times daily.  5  . anastrozole (ARIMIDEX) 1 MG tablet Take 1 mg by mouth 3 (three) times a week. Sunday, Tuesday and Friday    . aspirin EC 81 MG tablet Take 81 mg by mouth daily.    . cetirizine (ZYRTEC) 10 MG tablet Take 10 mg by mouth daily.    . Cinnamon 500 MG capsule Take 500 mg by mouth daily.     . Coenzyme Q10 200 MG capsule Take 200 mg by mouth daily.    . fentaNYL (DURAGESIC - DOSED MCG/HR) 12 MCG/HR Place 12 mcg onto the skin every other day.     Javier Docker Oil 500 MG CAPS Take 500 mg by mouth daily.     . mirtazapine (REMERON) 15 MG tablet Take 15 mg by mouth at bedtime.    . naproxen sodium (ANAPROX) 220 MG tablet Take 220 mg by  mouth daily as needed (for pain or headache).     . pregabalin (LYRICA) 75 MG capsule Take 75-150 mg by mouth See admin instructions. Take 150 mg by mouth in the morning and take 75 mg by mouth at supper    . Probiotic Product (PROBIOTIC DAILY PO) Take 1 capsule by mouth daily.     . RABEprazole (ACIPHEX) 20 MG tablet Take 20 mg by mouth daily.    . saw palmetto 160 MG capsule Take 160 mg by mouth daily.    . SUPER B COMPLEX/C PO Take 1 tablet by mouth daily.    . tamsulosin (FLOMAX) 0.4 MG CAPS capsule Take 0.8 mg by mouth daily.   11  . traZODone (DESYREL) 50 MG tablet Take 50 mg by mouth at bedtime.   1  . Turmeric 500 MG CAPS Take 1 capsule by mouth daily.    . mometasone (NASONEX) 50 MCG/ACT nasal spray Place 2 sprays into the nose daily as needed.  12    No results found for this or any previous visit (from the past 48 hour(s)). No results found.  ROS  Blood pressure (!) 160/90, temperature 98 F (36.7 C), temperature source Oral, resp. rate (!) 22, SpO2 96 %. Physical Exam  Constitutional: He appears well-developed and well-nourished.  HENT:  Mouth/Throat: Oropharynx is clear and moist.  Eyes: Conjunctivae are normal. No scleral icterus.  Neck:  He has a vertical scar to the right of midline at mid neck. Scar in left supraclavicular fossa. No lymphadenopathy or thyromegaly.  Cardiovascular: Normal rate, regular rhythm and normal heart sounds.  No murmur heard. Respiratory: Effort normal and breath sounds normal.  GI: Soft. He exhibits no distension and no mass. There is no tenderness.  Musculoskeletal: He exhibits no edema.  Neurological: He is alert.  Skin: Skin is warm and dry.     Assessment/Plan Average or screening colonoscopy.  Hildred Laser, MD 08/01/2017, 10:19 AM

## 2017-08-01 NOTE — Anesthesia Preprocedure Evaluation (Signed)
Anesthesia Evaluation  Patient identified by MRN, date of birth, ID band Patient awake    Reviewed: Allergy & Precautions, NPO status , Patient's Chart, lab work & pertinent test results  History of Anesthesia Complications (+) PONV  Airway Mallampati: II  TM Distance: >3 FB Neck ROM: Full    Dental no notable dental hx. (+) Teeth Intact   Pulmonary neg pulmonary ROS, sleep apnea , pneumonia, resolved, former smoker,  H/o VATS in 02/2017 on the L for -double pneumonia H/o Chest surg on the R ~2010  Wife states OSA -husband denies - states doesn't use CPAP   Pulmonary exam normal breath sounds clear to auscultation       Cardiovascular negative cardio ROS Normal cardiovascular exam Rhythm:Regular Rate:Normal     Neuro/Psych Anxiety Depression  Neuromuscular disease negative neurological ROS  negative psych ROS   GI/Hepatic negative GI ROS, Neg liver ROS, GERD  Medicated and Controlled,  Endo/Other  negative endocrine ROS  Renal/GU Renal hypertensionRenal diseasenegative Renal ROS  negative genitourinary   Musculoskeletal negative musculoskeletal ROS (+) Fibromyalgia -  Abdominal   Peds negative pediatric ROS (+)  Hematology negative hematology ROS (+)   Anesthesia Other Findings   Reproductive/Obstetrics negative OB ROS                             Anesthesia Physical Anesthesia Plan  ASA: III  Anesthesia Plan: General   Post-op Pain Management:    Induction: Intravenous  PONV Risk Score and Plan:   Airway Management Planned: Nasal Cannula  Additional Equipment:   Intra-op Plan:   Post-operative Plan:   Informed Consent: I have reviewed the patients History and Physical, chart, labs and discussed the procedure including the risks, benefits and alternatives for the proposed anesthesia with the patient or authorized representative who has indicated his/her understanding and  acceptance.   Dental advisory given  Plan Discussed with: CRNA  Anesthesia Plan Comments:         Anesthesia Quick Evaluation

## 2017-08-01 NOTE — Op Note (Signed)
[facility] Patient Name: Kenneth [name] Procedure Date: 08/01/2017 10:03 AM MRN: [id number] Date of Birth: 01/08/1956 Attending MD: Hildred Laser , MD CSN: [id number] Age: 62 Admit Type: Outpatient Procedure:                Colonoscopy Indications:              Screening for colorectal malignant neoplasm Providers:                Hildred Laser, MD, Otis Peak B. Sharon Seller, RN, Randa Spike, Technician Referring MD:              Medicines:                Propofol per Anesthesia Complications:            No immediate complications. Estimated Blood Loss:     Estimated blood loss was minimal. Procedure:                Pre-Anesthesia Assessment:                           - Prior to the procedure, a History and Physical                            was performed, and patient medications and                            allergies were reviewed. The patient's tolerance of                            previous anesthesia was also reviewed. The risks                            and benefits of the procedure and the sedation                            options and risks were discussed with the patient.                            All questions were answered, and informed consent                            was obtained. Prior Anticoagulants: The patient                            last took aspirin 4 days and naproxen 4 days prior                            to the procedure. ASA Grade Assessment: III - A                            patient with severe systemic disease. After  reviewing the risks and benefits, the patient was                            deemed in satisfactory condition to undergo the                            procedure.                           After obtaining informed consent, the colonoscope                            was passed under direct vision. Throughout the                            procedure, the patient's blood pressure,  pulse, and                            oxygen saturations were monitored continuously. The                            [id number]-[id number] ([id number]) scope was introduced through                            the and advanced to the the cecum, identified by                            appendiceal orifice and ileocecal valve. The                            colonoscopy was performed without difficulty. The                            patient tolerated the procedure well. The quality                            of the bowel preparation was good. The ileocecal                            valve, appendiceal orifice, and rectum were                            photographed. Scope In: 10:36:07 AM Scope Out: 10:52:41 AM Scope Withdrawal Time: 0 hours 13 minutes 24 seconds  Total Procedure Duration: 0 hours 16 minutes 34 seconds  Findings:      The perianal and digital rectal examinations were normal.      A small polyp was found in the ascending colon. The polyp was sessile.       Biopsies were taken with a cold forceps for histology. The pathology       specimen was placed into Bottle Number 1.      A few small-mouthed diverticula were found in the sigmoid colon.      The exam was otherwise normal throughout the examined colon.      External hemorrhoids were found during retroflexion. The  hemorrhoids       were small. Impression:               - One small polyp in the ascending colon. Biopsied.                           - Diverticulosis in the sigmoid colon.                           - External hemorrhoids. Moderate Sedation:      Per Anesthesia Care Recommendation:           - Patient has a contact number available for                            emergencies. The signs and symptoms of potential                            delayed complications were discussed with the                            patient. Return to normal activities tomorrow.                            Written discharge instructions were provided  to the                            patient.                           - High fiber diet today.                           - Continue present medications.                           - No aspirin, ibuprofen, naproxen, or other                            non-steroidal anti-inflammatory drugs for 1 day.                           - Await pathology results.                           - Repeat colonoscopy is recommended. The                            colonoscopy date will be determined after pathology                            results from today's exam become available for                            review. Procedure Code(s):        --- Professional ---  45380, Colonoscopy, flexible; with biopsy, single                            or multiple Diagnosis Code(s):        --- Professional ---                           Z12.11, Encounter for screening for malignant                            neoplasm of colon                           D12.2, Benign neoplasm of ascending colon                           K64.4, Residual hemorrhoidal skin tags                           K57.30, Diverticulosis of large intestine without                            perforation or abscess without bleeding CPT copyright 2017 [employer]. All rights reserved. The codes documented in this report are preliminary and upon coder review may  be revised to meet current compliance requirements. Hildred Laser, MD Hildred Laser, MD 08/01/2017 10:59:31 AM This report has been signed electronically. Number of Addenda: 0

## 2017-08-01 NOTE — Discharge Instructions (Signed)
No aspirin or NSAIDs for 24 hours. Resume other medications as before. High-fiber diet. No driving for 24 hours. Physician will call with biopsy results.  Colonoscopy, Adult, Care After This sheet gives you information about how to care for yourself after your procedure. Your doctor may also give you more specific instructions. If you have problems or questions, call your doctor. Follow these instructions at home: General instructions   For the first 24 hours after the procedure: ? Do not drive or use machinery. ? Do not sign important documents. ? Do not drink alcohol. ? Do your daily activities more slowly than normal. ? Eat foods that are soft and easy to digest. ? Rest often.  Take over-the-counter or prescription medicines only as told by your doctor.  It is up to you to get the results of your procedure. Ask your doctor, or the department performing the procedure, when your results will be ready. To help cramping and bloating:  Try walking around.  Put heat on your belly (abdomen) as told by your doctor. Use a heat source that your doctor recommends, such as a moist heat pack or a heating pad. ? Put a towel between your skin and the heat source. ? Leave the heat on for 20-30 minutes. ? Remove the heat if your skin turns bright red. This is especially important if you cannot feel pain, heat, or cold. You can get burned. Eating and drinking  Drink enough fluid to keep your pee (urine) clear or pale yellow.  Return to your normal diet as told by your doctor. Avoid heavy or fried foods that are hard to digest.  Avoid drinking alcohol for as long as told by your doctor. Contact a doctor if:  You have blood in your poop (stool) 2-3 days after the procedure. Get help right away if:  You have more than a small amount of blood in your poop.  You see large clumps of tissue (blood clots) in your poop.  Your belly is swollen.  You feel sick to your stomach  (nauseous).  You throw up (vomit).  You have a fever.  You have belly pain that gets worse, and medicine does not help your pain. This information is not intended to replace advice given to you by your health care provider. Make sure you discuss any questions you have with your health care provider. Document Released: 02/02/2010 Document Revised: 09/25/2015 Document Reviewed: 09/25/2015 Elsevier Interactive Patient Education  2017 [employer].  Colon Polyps Polyps are tissue growths inside the body. Polyps can grow in many places, including the large intestine (colon). A polyp may be a round bump or a mushroom-shaped growth. You could have one polyp or several. Most colon polyps are noncancerous (benign). However, some colon polyps can become cancerous over time. What are the causes? The exact cause of colon polyps is not known. What increases the risk? This condition is more likely to develop in people who:  Have a family history of colon cancer or colon polyps.  Are older than 63 or older than 45 if they are African American.  Have inflammatory bowel disease, such as ulcerative colitis or Crohn disease.  Are overweight.  Smoke cigarettes.  Do not get enough exercise.  Drink too much alcohol.  Eat a diet that is: ? High in fat and red meat. ? Low in fiber.  Had childhood cancer that was treated with abdominal radiation.  What are the signs or symptoms? Most polyps do not cause symptoms. If  you have symptoms, they may include:  Blood coming from your rectum when having a bowel movement.  Blood in your stool.The stool may look dark red or black.  A change in bowel habits, such as constipation or diarrhea.  How is this diagnosed? This condition is diagnosed with a colonoscopy. This is a procedure that uses a lighted, flexible scope to look at the inside of your colon. How is this treated? Treatment for this condition involves removing any polyps that are found.  Those polyps will then be tested for cancer. If cancer is found, your health care provider will talk to you about options for colon cancer treatment. Follow these instructions at home: Diet  Eat plenty of fiber, such as fruits, vegetables, and whole grains.  Eat foods that are high in calcium and vitamin D, such as milk, cheese, yogurt, eggs, liver, fish, and broccoli.  Limit foods high in fat, red meats, and processed meats, such as hot dogs, sausage, bacon, and lunch meats.  Maintain a healthy weight, or lose weight if recommended by your health care provider. General instructions  Do not smoke cigarettes.  Do not drink alcohol excessively.  Keep all follow-up visits as told by your health care provider. This is important. This includes keeping regularly scheduled colonoscopies. Talk to your health care provider about when you need a colonoscopy.  Exercise every day or as told by your health care provider. Contact a health care provider if:  You have new or worsening bleeding during a bowel movement.  You have new or increased blood in your stool.  You have a change in bowel habits.  You unexpectedly lose weight. This information is not intended to replace advice given to you by your health care provider. Make sure you discuss any questions you have with your health care provider. Document Released: 09/27/2003 Document Revised: 06/08/2015 Document Reviewed: 11/21/2014 Elsevier Interactive Patient Education  [employer].

## 2017-08-01 NOTE — Anesthesia Postprocedure Evaluation (Signed)
Anesthesia Post Note  Patient: Kenneth [name]  Procedure(s) Performed: COLONOSCOPY WITH PROPOFOL (N/A ) POLYPECTOMY  Patient location during evaluation: PACU Anesthesia Type: General Level of consciousness: awake and alert and oriented Pain management: pain level controlled Vital Signs Assessment: post-procedure vital signs reviewed and stable Respiratory status: spontaneous breathing Cardiovascular status: stable Postop Assessment: no apparent nausea or vomiting Anesthetic complications: no     Last Vitals:  Vitals:   08/01/17 0923  BP: (!) 160/90  Resp: (!) 22  Temp: 36.7 C  SpO2: 96%    Last Pain:  Vitals:   08/01/17 1030  TempSrc:   PainSc: 4                  ADAMS, AMY A

## 2017-08-01 NOTE — Transfer of Care (Signed)
Immediate Anesthesia Transfer of Care Note  Patient: Kenneth [name]  Procedure(s) Performed: COLONOSCOPY WITH PROPOFOL (N/A ) POLYPECTOMY  Patient Location: PACU  Anesthesia Type:MAC  Level of Consciousness: awake, alert , oriented and patient cooperative  Airway & Oxygen Therapy: Patient Spontanous Breathing  Post-op Assessment: Report given to RN and Post -op Vital signs reviewed and stable  Post vital signs: Reviewed and stable  Last Vitals:  Vitals Value Taken Time  BP    Temp    Pulse    Resp    SpO2      Last Pain:  Vitals:   08/01/17 1030  TempSrc:   PainSc: 4       Patients Stated Pain Goal: 8 (79/39/03 0092)  Complications: No apparent anesthesia complications

## 2017-08-05 ENCOUNTER — Ambulatory Visit (INDEPENDENT_AMBULATORY_CARE_PROVIDER_SITE_OTHER): Payer: Commercial Managed Care - PPO | Admitting: Urology

## 2017-08-05 DIAGNOSIS — E291 Testicular hypofunction: Secondary | ICD-10-CM | POA: Diagnosis not present

## 2017-08-05 DIAGNOSIS — N5201 Erectile dysfunction due to arterial insufficiency: Secondary | ICD-10-CM

## 2017-08-05 DIAGNOSIS — C61 Malignant neoplasm of prostate: Secondary | ICD-10-CM | POA: Diagnosis not present

## 2017-08-06 ENCOUNTER — Encounter (HOSPITAL_COMMUNITY): Payer: Self-pay | Admitting: Internal Medicine

## 2017-08-11 ENCOUNTER — Encounter (INDEPENDENT_AMBULATORY_CARE_PROVIDER_SITE_OTHER): Payer: Self-pay | Admitting: *Deleted

## 2017-08-11 ENCOUNTER — Other Ambulatory Visit (INDEPENDENT_AMBULATORY_CARE_PROVIDER_SITE_OTHER): Payer: Self-pay | Admitting: *Deleted

## 2017-08-11 DIAGNOSIS — D696 Thrombocytopenia, unspecified: Secondary | ICD-10-CM

## 2017-09-23 ENCOUNTER — Ambulatory Visit (INDEPENDENT_AMBULATORY_CARE_PROVIDER_SITE_OTHER): Payer: Medicare Other | Admitting: Vascular Surgery

## 2017-09-23 ENCOUNTER — Encounter (INDEPENDENT_AMBULATORY_CARE_PROVIDER_SITE_OTHER): Payer: Medicare Other

## 2017-10-28 ENCOUNTER — Other Ambulatory Visit (INDEPENDENT_AMBULATORY_CARE_PROVIDER_SITE_OTHER): Payer: Self-pay | Admitting: Vascular Surgery

## 2017-10-28 DIAGNOSIS — I6523 Occlusion and stenosis of bilateral carotid arteries: Secondary | ICD-10-CM

## 2017-10-29 ENCOUNTER — Encounter (INDEPENDENT_AMBULATORY_CARE_PROVIDER_SITE_OTHER): Payer: Self-pay | Admitting: Nurse Practitioner

## 2017-10-29 ENCOUNTER — Ambulatory Visit (INDEPENDENT_AMBULATORY_CARE_PROVIDER_SITE_OTHER): Payer: Commercial Managed Care - PPO

## 2017-10-29 ENCOUNTER — Ambulatory Visit (INDEPENDENT_AMBULATORY_CARE_PROVIDER_SITE_OTHER): Payer: Commercial Managed Care - PPO | Admitting: Nurse Practitioner

## 2017-10-29 VITALS — BP 162/75 | HR 63 | Resp 16 | Ht 74.0 in | Wt 199.0 lb

## 2017-10-29 DIAGNOSIS — G4701 Insomnia due to medical condition: Secondary | ICD-10-CM

## 2017-10-29 DIAGNOSIS — I6523 Occlusion and stenosis of bilateral carotid arteries: Secondary | ICD-10-CM

## 2017-10-29 DIAGNOSIS — F17220 Nicotine dependence, chewing tobacco, uncomplicated: Secondary | ICD-10-CM | POA: Diagnosis not present

## 2017-10-30 ENCOUNTER — Encounter (INDEPENDENT_AMBULATORY_CARE_PROVIDER_SITE_OTHER): Payer: Self-pay | Admitting: Nurse Practitioner

## 2017-10-30 NOTE — Progress Notes (Signed)
Subjective:    Patient ID: Kenneth [name], male    DOB: 04-02-1955, 62 y.o.   MRN: [id number] Chief Complaint  Patient presents with  . Follow-up    1 year Carotid follow up    HPI  Kenneth [name] is a 62 y.o. male The patient is seen for follow up evaluation of carotid stenosis. The carotid stenosis followed by ultrasound.   The patient denies amaurosis fugax. There is no recent history of TIA symptoms or focal motor deficits. There is no prior documented CVA.  The patient is taking enteric-coated aspirin 81 mg daily.  There is no history of migraine headaches. There is no history of seizures.  The patient has a history of coronary artery disease, no recent episodes of angina or shortness of breath. The patient denies PAD or claudication symptoms. There is a history of hyperlipidemia which is being treated with a statin.    Carotid Duplex done today shows known internal carotid artery occlusion on the right.  There is a 1 to 39% stenosis on the left internal carotid artery.  No change compared to last study in 09/20/2016  Past Medical History:  Diagnosis Date  . Anxiety   . 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 Carotid artery occlusion    right  . Chronic pain disorder    left arm, shoulder and chest ;thoracic syndrome  . Collapsed lung 2010  . Depression   . Fibromyalgia    "left shoulder area" (10/24/2011)  . GERD (gastroesophageal reflux disease)   . History of blood transfusion 2010  . History of thoracic outlet syndrome 2008   rib removed in 2009  . Insomnia due to medical condition   . Neuromuscular disorder ([place])   . Neuropathy of hand    left  . Pneumonia 2010  . PONV (postoperative nausea and vomiting)   . Prostate cancer ([place])   . 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 RSD (reflex sympathetic dystrophy)    "left shoulder and left arm" (10/24/2011)  . Sleep apnea    could not tolerate CPAP, PCP aware  . Snoring     Social History    Socioeconomic History  . Marital status: Married    Spouse name: Not on file  . Number of children: 2  . Years of education: College  . Highest education level: Not on file  Occupational History  . Occupation: Disabled  Social Needs  . Financial resource strain: Not on file  . Food insecurity:    Worry: Not on file    Inability: Not on file  . Transportation needs:    Medical: Not on file    Non-medical: Not on file  Tobacco Use  . Smoking status: Former Smoker    Packs/day: 1.00    Years: 30.00    Pack years: 30.00    Types: Cigarettes    Last attempt to quit: 10/20/2004    Years since quitting: 13.0  . Smokeless tobacco: Current User    Types: Chew  Substance and Sexual Activity  . Alcohol use: No    Alcohol/week: 0.0 standard drinks  . Drug use: No  . Sexual activity: Yes  Lifestyle  . Physical activity:    Days per week: Not on file    Minutes per session: Not on file  . Stress: Not on file  Relationships  . Social connections:    Talks on phone: Not on file  Gets together: Not on file    Attends religious service: Not on file    Active member of club or organization: Not on file    Attends meetings of clubs or organizations: Not on file    Relationship status: Not on file  . Intimate partner violence:    Fear of current or ex partner: Not on file    Emotionally abused: Not on file    Physically abused: Not on file    Forced sexual activity: Not on file  Other Topics Concern  . Not on file  Social History Narrative   Drinks 2 sodas a day    Past Surgical History:  Procedure Laterality Date  . ANTERIOR CERVICAL DECOMP/DISCECTOMY FUSION  10/24/2011  . ANTERIOR CERVICAL DECOMP/DISCECTOMY FUSION  10/24/2011   Procedure: ANTERIOR CERVICAL DECOMPRESSION/DISCECTOMY FUSION 2 LEVELS;  Surgeon: Melina Schools, MD;  Location: [facility];  Service: Orthopedics;  Laterality: N/A;  ACDF C3-5 C-ARM SKYTRON TABLE SYNTHES VECTOR TITAN CAGE NEURO MONITORING SYTEM  .  BIOPSY N/A 02/25/2017   Procedure: BIOPSY DIAPHRAGMATIC PLAQUE;  Surgeon: Grace Isaac, MD;  Location: [facility];  Service: Thoracic;  Laterality: N/A;  . COLONOSCOPY WITH PROPOFOL N/A 08/01/2017   Procedure: COLONOSCOPY WITH PROPOFOL;  Surgeon: Rogene Houston, MD;  [facility];  Service: Endoscopy;  Laterality: N/A;  11:15  . CYSTECTOMY     left hand x 3  . DECORTICATION Left 02/25/2017   Procedure: DECORTICATION;  Surgeon: Grace Isaac, MD;  Location: [facility];  Service: Thoracic;  Laterality: Left;  . EMPYEMA DRAINAGE Left 02/25/2017   Procedure: LEFT EMPYEMA DRAINAGE;  Surgeon: Grace Isaac, MD;  Location: [facility];  Service: Thoracic;  Laterality: Left;  . EYE SURGERY     lasix  . KNEE ARTHROSCOPY  1987   right  . POLYPECTOMY  08/01/2017   Procedure: POLYPECTOMY;  Surgeon: Rogene Houston, MD;  [facility];  Service: Endoscopy;;  colon  . REFRACTIVE SURGERY  2003   bilaterally  . RIB RESECTION  2009   "left anterior" (10/24/2011)  . VASCULAR SURGERY     to remove  blood clots  . VIDEO ASSISTED THORACOSCOPY (VATS)/EMPYEMA Left 02/25/2017   Procedure: LEFT VIDEO ASSISTED THORACOSCOPY (VATS);  Surgeon: Grace Isaac, MD;  Location: [facility];  Service: Thoracic;  Laterality: Left;  Marland Kitchen VIDEO BRONCHOSCOPY N/A 02/25/2017   Procedure: VIDEO BRONCHOSCOPY;  Surgeon: Grace Isaac, MD;  Location[facility];  Service: Thoracic;  Laterality: N/A;    Family History  Problem Relation Age of Onset  . Diabetes Mother     No Known Allergies   Review of Systems   Review of Systems: Negative Unless Checked Constitutional: [] Weight loss  [] Fever  [] Chills Cardiac: [] Chest pain   []  Atrial Fibrillation  [] Palpitations   [] Shortness of breath when laying flat   [] Shortness of breath with exertion. Vascular:  [] Pain in legs with walking   [] Pain in legs with standing  [] History of DVT   [] Phlebitis   [] Swelling in legs   [] Varicose veins   [] Non-healing  ulcers Pulmonary:   [] Uses home oxygen   [] Productive cough   [] Hemoptysis   [] Wheeze  [] COPD   [] Asthma Neurologic:  [] Dizziness   [] Seizures   [] History of stroke   [] History of TIA  [] Aphasia   [] Vissual changes   [] Weakness or numbness in arm   [] Weakness or numbness in leg Musculoskeletal:   [] Joint swelling   [] Joint pain   [  x]Low back pain  []  History of Knee Replacement Hematologic:  [] Easy bruising  [] Easy bleeding   [] Hypercoagulable state   [] Anemic Gastrointestinal:  [] Diarrhea   [] Vomiting  [] Gastroesophageal reflux/heartburn   [] Difficulty swallowing. Genitourinary:  [] Chronic kidney disease   [] Difficult urination  [] Anuric   [] Blood in urine Skin:  [] Rashes   [] Ulcers  Psychological:  [x] History of anxiety   []  History of major depression  []  Memory Difficulties     Objective:   Physical Exam  BP (!) 162/75 (BP Location: Right Arm, Patient Position: Sitting)   Pulse 63   Resp 16   Ht 6\' 2"  (1.88 m)   Wt 199 lb (90.3 kg)   BMI 25.55 kg/m   Gen: WD/WN, NAD Head: [place]/AT, No temporalis wasting.  Ear/Nose/Throat: Hearing grossly intact, nares w/o erythema or drainage Eyes: PER, EOMI, sclera nonicteric.  Neck: Supple, no masses.  No JVD.  Pulmonary:  Good air movement, no use of accessory muscles.  Cardiac: RRR Vascular:  Vessel Right Left  Radial Palpable Palpable  Gastrointestinal: soft, non-distended. No guarding/no peritoneal signs.  Musculoskeletal: M/S 5/5 throughout.  No deformity or atrophy.  Neurologic: Pain and light touch intact in extremities.  Symmetrical.  Speech is fluent. Motor exam as listed above. Psychiatric: Judgment intact, Mood & affect appropriate for pt's clinical situation. Dermatologic: No Venous rashes. No Ulcers Noted.  No changes consistent with cellulitis. Lymph : No Cervical lymphadenopathy, no lichenification or skin changes of chronic lymphedema.      Assessment & Plan:   1. Bilateral carotid artery stenosis Recommend:  Given the  patient's asymptomatic subcritical stenosis no further invasive testing or surgery at this time.  Duplex ultrasound shows 1 to 39% stenosis in the left carotid.  The right carotid has a known occlusion.  Continue antiplatelet therapy as prescribed Continue management of CAD, HTN and Hyperlipidemia Healthy heart diet,  encouraged exercise at least 4 times per week Follow up in 12 months with duplex ultrasound and physical exam   2. Insomnia due to medical condition Currently well managed.  No issues.  We will continue with primary care maintenance.   Current Outpatient Medications on File Prior to Visit  Medication Sig Dispense Refill  . ALPRAZolam (XANAX) 0.5 MG tablet Take 0.5 mg by mouth 2 (two) times daily.   5  . anastrozole (ARIMIDEX) 1 MG tablet Take 1 mg by mouth 3 (three) times a week. Sunday, Tuesday and Friday    . aspirin EC 81 MG tablet Take 1 tablet (81 mg total) by mouth daily.    . cetirizine (ZYRTEC) 10 MG tablet Take 10 mg by mouth daily.    . Cinnamon 500 MG capsule Take 500 mg by mouth daily.     . Coenzyme Q10 200 MG capsule Take 200 mg by mouth daily.    . fentaNYL (DURAGESIC - DOSED MCG/HR) 12 MCG/HR Place 12 mcg onto the skin every other day.     Javier Docker Oil 500 MG CAPS Take 500 mg by mouth daily.     . mirtazapine (REMERON) 15 MG tablet Take 15 mg by mouth at bedtime.    . mometasone (NASONEX) 50 MCG/ACT nasal spray Place 2 sprays into the nose daily as needed.  12  . naproxen sodium (ANAPROX) 220 MG tablet Take 220 mg by mouth daily as needed (for pain or headache).     . pregabalin (LYRICA) 75 MG capsule Take 75-150 mg by mouth See admin instructions. Take 150 mg by mouth in  the morning and take 75 mg by mouth at supper    . Probiotic Product (PROBIOTIC DAILY PO) Take 1 capsule by mouth daily.     . RABEprazole (ACIPHEX) 20 MG tablet Take 20 mg by mouth daily.    . saw palmetto 160 MG capsule Take 160 mg by mouth daily.    . SUPER B COMPLEX/C PO Take 1 tablet  by mouth daily.    . tamsulosin (FLOMAX) 0.4 MG CAPS capsule Take 0.8 mg by mouth daily.   11  . traZODone (DESYREL) 50 MG tablet Take 50 mg by mouth at bedtime.   1  . Turmeric 500 MG CAPS Take 1 capsule by mouth daily.     No current facility-administered medications on file prior to visit.     There are no Patient Instructions on file for this visit. Return in about 1 year (around 10/30/2018).   Kris Hartmann, NP

## 2018-02-24 ENCOUNTER — Ambulatory Visit (INDEPENDENT_AMBULATORY_CARE_PROVIDER_SITE_OTHER): Payer: Commercial Managed Care - PPO | Admitting: Urology

## 2018-02-24 DIAGNOSIS — N5201 Erectile dysfunction due to arterial insufficiency: Secondary | ICD-10-CM

## 2018-02-24 DIAGNOSIS — E291 Testicular hypofunction: Secondary | ICD-10-CM | POA: Diagnosis not present

## 2018-02-24 DIAGNOSIS — C61 Malignant neoplasm of prostate: Secondary | ICD-10-CM | POA: Diagnosis not present

## 2018-11-02 ENCOUNTER — Other Ambulatory Visit (INDEPENDENT_AMBULATORY_CARE_PROVIDER_SITE_OTHER): Payer: Self-pay | Admitting: Nurse Practitioner

## 2018-11-02 DIAGNOSIS — I6523 Occlusion and stenosis of bilateral carotid arteries: Secondary | ICD-10-CM

## 2018-11-04 ENCOUNTER — Ambulatory Visit (INDEPENDENT_AMBULATORY_CARE_PROVIDER_SITE_OTHER): Payer: Commercial Managed Care - PPO

## 2018-11-04 ENCOUNTER — Other Ambulatory Visit: Payer: Self-pay

## 2018-11-04 ENCOUNTER — Encounter (INDEPENDENT_AMBULATORY_CARE_PROVIDER_SITE_OTHER): Payer: Self-pay | Admitting: Nurse Practitioner

## 2018-11-04 ENCOUNTER — Ambulatory Visit (INDEPENDENT_AMBULATORY_CARE_PROVIDER_SITE_OTHER): Payer: Commercial Managed Care - PPO | Admitting: Nurse Practitioner

## 2018-11-04 VITALS — BP 127/70 | HR 79 | Resp 10 | Ht 74.0 in | Wt 200.0 lb

## 2018-11-04 DIAGNOSIS — I6523 Occlusion and stenosis of bilateral carotid arteries: Secondary | ICD-10-CM

## 2018-11-05 ENCOUNTER — Encounter (INDEPENDENT_AMBULATORY_CARE_PROVIDER_SITE_OTHER): Payer: Self-pay | Admitting: Nurse Practitioner

## 2018-11-05 NOTE — Progress Notes (Signed)
SUBJECTIVE:  Patient ID: Kenneth [name], male    DOB: 07-13-1955, 63 y.o.   MRN: [id number] Chief Complaint  Patient presents with  . Follow-up    HPI  Kenneth [name] is a 63 y.o. male The patient is seen for follow up evaluation of carotid stenosis. The carotid stenosis followed by ultrasound.   The patient denies amaurosis fugax. There is no recent history of TIA symptoms or focal motor deficits. There is no prior documented CVA.  The patient is taking enteric-coated aspirin 81 mg daily.  There is no history of migraine headaches. There is no history of seizures.  The patient has a history of coronary artery disease, no recent episodes of angina or shortness of breath. The patient denies PAD or claudication symptoms. There is a history of hyperlipidemia which is being treated with a statin.    Carotid Duplex done today shows an occluded right ICA with 1 to 39% stenosis of the left.  No change compared to last study on 10/29/2017  Past Medical History:  Diagnosis Date  . Anxiety   . 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 Carotid artery occlusion    right  . Chronic pain disorder    left arm, shoulder and chest ;thoracic syndrome  . Collapsed lung 2010  . Depression   . Fibromyalgia    "left shoulder area" (10/24/2011)  . GERD (gastroesophageal reflux disease)   . History of blood transfusion 2010  . History of thoracic outlet syndrome 2008   rib removed in 2009  . Insomnia due to medical condition   . Neuromuscular disorder ([place])   . Neuropathy of hand    left  . Pneumonia 2010  . PONV (postoperative nausea and vomiting)   . Prostate cancer ([place])   . 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 RSD (reflex sympathetic dystrophy)    "left shoulder and left arm" (10/24/2011)  . Sleep apnea    could not tolerate CPAP, PCP aware  . Snoring     Past Surgical History:  Procedure Laterality Date  . ANTERIOR CERVICAL DECOMP/DISCECTOMY FUSION  10/24/2011  .  ANTERIOR CERVICAL DECOMP/DISCECTOMY FUSION  10/24/2011   Procedure: ANTERIOR CERVICAL DECOMPRESSION/DISCECTOMY FUSION 2 LEVELS;  Surgeon: Melina Schools, MD;  Location: [place];  Service: Orthopedics;  Laterality: N/A;  ACDF C3-5 C-ARM SKYTRON TABLE SYNTHES VECTOR TITAN CAGE NEURO MONITORING SYTEM  . BIOPSY N/A 02/25/2017   Procedure: BIOPSY DIAPHRAGMATIC PLAQUE;  Surgeon: Grace Isaac, MD;  Location: [place];  Service: Thoracic;  Laterality: N/A;  . COLONOSCOPY WITH PROPOFOL N/A 08/01/2017   Procedure: COLONOSCOPY WITH PROPOFOL;  Surgeon: Rogene Houston, MD;  [facility];  Service: Endoscopy;  Laterality: N/A;  11:15  . CYSTECTOMY     left hand x 3  . DECORTICATION Left 02/25/2017   Procedure: DECORTICATION;  Surgeon: Grace Isaac, MD;  Location: [place];  Service: Thoracic;  Laterality: Left;  . EMPYEMA DRAINAGE Left 02/25/2017   Procedure: LEFT EMPYEMA DRAINAGE;  Surgeon: Grace Isaac, MD;  Location: [place];  Service: Thoracic;  Laterality: Left;  . EYE SURGERY     lasix  . KNEE ARTHROSCOPY  1987   right  . POLYPECTOMY  08/01/2017   Procedure: POLYPECTOMY;  Surgeon: Rogene Houston, MD;  [facility];  Service: Endoscopy;;  colon  . REFRACTIVE SURGERY  2003   bilaterally  . RIB RESECTION  2009   "  left anterior" (10/24/2011)  . VASCULAR SURGERY     to remove  blood clots  . VIDEO ASSISTED THORACOSCOPY (VATS)/EMPYEMA Left 02/25/2017   Procedure: LEFT VIDEO ASSISTED THORACOSCOPY (VATS);  Surgeon: Grace Isaac, MD;  Location: [place];  Service: Thoracic;  Laterality: Left;  Marland Kitchen VIDEO BRONCHOSCOPY N/A 02/25/2017   Procedure: VIDEO BRONCHOSCOPY;  Surgeon: Grace Isaac, MD;  Location: [facility];  Service: Thoracic;  Laterality: N/A;    Social History   Socioeconomic History  . Marital status: Married    Spouse name: Not on file  . Number of children: 2  . Years of education: College  . Highest education level: Not on file  Occupational History   . Occupation: Disabled  Social Needs  . Financial resource strain: Not on file  . Food insecurity    Worry: Not on file    Inability: Not on file  . Transportation needs    Medical: Not on file    Non-medical: Not on file  Tobacco Use  . Smoking status: Former Smoker    Packs/day: 1.00    Years: 30.00    Pack years: 30.00    Types: Cigarettes    Quit date: 10/20/2004    Years since quitting: 14.0  . Smokeless tobacco: Current User    Types: Chew  Substance and Sexual Activity  . Alcohol use: No    Alcohol/week: 0.0 standard drinks  . Drug use: No  . Sexual activity: Yes  Lifestyle  . Physical activity    Days per week: Not on file    Minutes per session: Not on file  . Stress: Not on file  Relationships  . Social Herbalist on phone: Not on file    Gets together: Not on file    Attends religious service: Not on file    Active member of club or organization: Not on file    Attends meetings of clubs or organizations: Not on file    Relationship status: Not on file  . Intimate partner violence    Fear of current or ex partner: Not on file    Emotionally abused: Not on file    Physically abused: Not on file    Forced sexual activity: Not on file  Other Topics Concern  . Not on file  Social History Narrative   Drinks 2 sodas a day    Family History  Problem Relation Age of Onset  . Diabetes Mother     No Known Allergies   Review of Systems   Review of Systems: Negative Unless Checked Constitutional: [] Weight loss  [] Fever  [] Chills Cardiac: [] Chest pain   []  Atrial Fibrillation  [] Palpitations   [] Shortness of breath when laying flat   [] Shortness of breath with exertion. [] Shortness of breath at rest Vascular:  [] Pain in legs with walking   [] Pain in legs with standing [] Pain in legs when laying flat   [] Claudication    [] Pain in feet when laying flat    [] History of DVT   [] Phlebitis   [] Swelling in legs   [] Varicose veins   [] Non-healing ulcers  Pulmonary:   [] Uses home oxygen   [] Productive cough   [] Hemoptysis   [] Wheeze  [] COPD   [] Asthma Neurologic:  [] Dizziness   [] Seizures  [] Blackouts [] History of stroke   [] History of TIA  [] Aphasia   [] Temporary Blindness   [] Weakness or numbness in arm   [] Weakness or numbness in leg Musculoskeletal:   [] Joint swelling   []   Joint pain   [] Low back pain  []  History of Knee Replacement [] Arthritis [] back Surgeries  []  Spinal Stenosis    Hematologic:  [] Easy bruising  [] Easy bleeding   [] Hypercoagulable state   [] Anemic Gastrointestinal:  [] Diarrhea   [] Vomiting  [] Gastroesophageal reflux/heartburn   [] Difficulty swallowing. [] Abdominal pain Genitourinary:  [] Chronic kidney disease   [] Difficult urination  [] Anuric   [] Blood in urine [] Frequent urination  [] Burning with urination   [] Hematuria Skin:  [] Rashes   [] Ulcers [] Wounds Psychological:  [] History of anxiety   []  History of major depression  []  Memory Difficulties      OBJECTIVE:   Physical Exam  BP 127/70 (BP Location: Right Arm, Patient Position: Sitting, Cuff Size: Normal)   Pulse 79   Resp 10   Ht 6\' 2"  (1.88 m)   Wt 200 lb (90.7 kg)   BMI 25.68 kg/m   Gen: WD/WN, NAD Head: [place]/AT, No temporalis wasting.  Ear/Nose/Throat: Hearing grossly intact, nares w/o erythema or drainage Eyes: PER, EOMI, sclera nonicteric.  Neck: Supple, no masses.  No JVD.  Pulmonary:  Good air movement, no use of accessory muscles.  Cardiac: RRR Vascular: no bruit auscultated Vessel Right Left  Radial Palpable Palpable   Gastrointestinal: soft, non-distended. No guarding/no peritoneal signs.  Musculoskeletal: M/S 5/5 throughout.  No deformity or atrophy.  Neurologic: Pain and light touch intact in extremities.  Symmetrical.  Speech is fluent. Motor exam as listed above. Psychiatric: Judgment intact, Mood & affect appropriate for pt's clinical situation. Dermatologic: No Venous rashes. No Ulcers Noted.  No changes consistent with cellulitis. Lymph  : No Cervical lymphadenopathy, no lichenification or skin changes of chronic lymphedema.       ASSESSMENT AND PLAN:  1. Bilateral carotid artery stenosis Recommend:  Given the patient's asymptomatic subcritical stenosis no further invasive testing or surgery at this time.  Carotid Duplex done today shows an occluded right ICA with 1 to 39% stenosis of the left.  Continue antiplatelet therapy as prescribed Continue management of CAD, HTN and Hyperlipidemia Healthy heart diet,  encouraged exercise at least 4 times per week Follow up in 12 months with duplex ultrasound and physical exam  - VAS US CAROTID; Future   Current Outpatient Medications on File Prior to Visit  Medication Sig Dispense Refill  . ALPRAZolam (XANAX) 0.5 MG tablet Take 0.5 mg by mouth 2 (two) times daily.   5  . anastrozole (ARIMIDEX) 1 MG tablet Take 1 mg by mouth 3 (three) times a week. Sunday, Tuesday and Friday    . aspirin EC 81 MG tablet Take 1 tablet (81 mg total) by mouth daily.    . celecoxib (CELEBREX) 200 MG capsule     . cetirizine (ZYRTEC) 10 MG tablet Take 10 mg by mouth daily.    . Cinnamon 500 MG capsule Take 500 mg by mouth daily.     . Coenzyme Q10 200 MG capsule Take 200 mg by mouth daily.    . fentaNYL (DURAGESIC - DOSED MCG/HR) 12 MCG/HR Place 12 mcg onto the skin every other day.     Javier Docker Oil 500 MG CAPS Take 500 mg by mouth daily.     . mirtazapine (REMERON) 15 MG tablet Take 15 mg by mouth at bedtime.    . mometasone (NASONEX) 50 MCG/ACT nasal spray Place 2 sprays into the nose daily as needed.  12  . pregabalin (LYRICA) 75 MG capsule Take 75-150 mg by mouth See admin instructions. Take 150 mg by mouth in  the morning and take 75 mg by mouth at supper    . Probiotic Product (PROBIOTIC DAILY PO) Take 1 capsule by mouth daily.     . RABEprazole (ACIPHEX) 20 MG tablet Take 20 mg by mouth daily. PRN    . saw palmetto 160 MG capsule Take 160 mg by mouth daily.    . SUPER B COMPLEX/C PO Take 1  tablet by mouth daily.    . tamsulosin (FLOMAX) 0.4 MG CAPS capsule Take 0.8 mg by mouth daily.   11  . traZODone (DESYREL) 50 MG tablet Take 50 mg by mouth at bedtime.   1  . Turmeric 500 MG CAPS Take 1 capsule by mouth daily.     No current facility-administered medications on file prior to visit.     There are no Patient Instructions on file for this visit. No follow-ups on file.   Kris Hartmann, NP  This note was completed with Sales executive.  Any errors are purely unintentional.

## 2018-11-10 ENCOUNTER — Ambulatory Visit (INDEPENDENT_AMBULATORY_CARE_PROVIDER_SITE_OTHER): Payer: Commercial Managed Care - PPO | Admitting: Urology

## 2018-11-10 DIAGNOSIS — E291 Testicular hypofunction: Secondary | ICD-10-CM

## 2018-11-10 DIAGNOSIS — N5201 Erectile dysfunction due to arterial insufficiency: Secondary | ICD-10-CM | POA: Diagnosis not present

## 2018-11-10 DIAGNOSIS — C61 Malignant neoplasm of prostate: Secondary | ICD-10-CM

## 2019-01-21 ENCOUNTER — Other Ambulatory Visit: Payer: Self-pay

## 2019-01-21 DIAGNOSIS — C61 Malignant neoplasm of prostate: Secondary | ICD-10-CM

## 2019-04-30 ENCOUNTER — Other Ambulatory Visit: Payer: Self-pay

## 2019-04-30 DIAGNOSIS — C61 Malignant neoplasm of prostate: Secondary | ICD-10-CM

## 2019-04-30 MED ORDER — ANASTROZOLE 1 MG PO TABS: 1.0000 mg | ORAL_TABLET | ORAL | 5 refills | Status: DC

## 2019-05-18 ENCOUNTER — Ambulatory Visit: Payer: Commercial Managed Care - PPO | Admitting: Urology

## 2019-06-22 ENCOUNTER — Ambulatory Visit: Payer: Commercial Managed Care - PPO | Admitting: Urology

## 2019-06-23 ENCOUNTER — Other Ambulatory Visit: Payer: Self-pay | Admitting: Urology

## 2019-06-23 LAB — PSA: PSA: 6.2 ng/mL — ABNORMAL HIGH (ref <=4.0)

## 2019-06-29 ENCOUNTER — Other Ambulatory Visit: Payer: Self-pay

## 2019-06-29 ENCOUNTER — Encounter: Payer: Self-pay | Admitting: Urology

## 2019-06-29 ENCOUNTER — Ambulatory Visit (INDEPENDENT_AMBULATORY_CARE_PROVIDER_SITE_OTHER): Payer: Commercial Managed Care - PPO | Admitting: Urology

## 2019-06-29 VITALS — BP 127/70 | HR 80 | Temp 97.7°F | Ht 73.0 in | Wt 192.0 lb

## 2019-06-29 DIAGNOSIS — C61 Malignant neoplasm of prostate: Secondary | ICD-10-CM

## 2019-06-29 LAB — POCT URINALYSIS DIPSTICK
Bilirubin, UA: NEGATIVE
Blood, UA: NEGATIVE
Glucose, UA: NEGATIVE
Ketones, UA: NEGATIVE
Leukocytes, UA: NEGATIVE
Nitrite, UA: NEGATIVE
Protein, UA: POSITIVE — AB
Spec Grav, UA: 1.015 (ref 1.010–1.025)
Urobilinogen, UA: 0.2 E.U./dL
pH, UA: 6 (ref 5.0–8.0)

## 2019-06-29 NOTE — Progress Notes (Signed)
H&P  Chief Complaint: Prostate Cancer  History of Present Illness:   6.15.2021: Most recent PSA 6.2 (6.9.2021). Overall, he feels like he is doing well and reports stable urological symptomatology. He continues on tamsulosin and finds this effective. He denies any specific complaints at present though does express some displeasure with being on such a large number of medications. His energy levels have remained stable -- no recent testosterone measurement.   IPSS Questionnaire (AUA-7): Over the past month.   1)  How often have you had a sensation of not emptying your bladder completely after you finish urinating?  2 - Less than half the time  2)  How often have you had to urinate again less than two hours after you finished urinating? 3 - About half the time  3)  How often have you found you stopped and started again several times when you urinated?  2 - Less than half the time  4) How difficult have you found it to postpone urination?  2 - Less than half the time  5) How often have you had a weak urinary stream?  2 - Less than half the time  6) How often have you had to push or strain to begin urination?  1 - Less than 1 time in 5  7) How many times did you most typically get up to urinate from the time you went to bed until the time you got up in the morning?  1 - 1 time  Total score:  0-7 mildly symptomatic   8-19 moderately symptomatic   20-35 severely symptomatic   Total: 13 QoL: 4  (below copied from [place] records):  Prostate Cancer:  Samson Ralph is a 64 year-old male established patient who is here for prostate cancer which has been treated.  He is participating in active surveillance. He has been under active surveillance for 12/13/2014. He has had a repeat biopsy.   TRUS/Bx on 11.29.2016. At that time, PSA was 3.68. Prostatic volume was 60 mL, PSAD 0.06. 1/12 cores were positive-his core in the right apex revealed GS 3+3 adenocarcinoma in 10% of the core. Because of the small  volume of prostate cancer, we could not get a reading from Oncotype DX.   11.2.2017: prostate MRI-revealed findings of a normal prostate without evidence of high-grade lesions. No NVI/SV invasion/capsular penetration/pelvic adenopathy/bony mets.   11.28.2017: Surveillance TRUS/Bx. At that time, PSA was 5.0, prostatic volume 63 mL, PSA density 0.08. All 12 cores were negative for cancer.   10.27.2020: Most recent PSA 5.1 (testosterone 522). His urinary pattern has remained stable and he is tolerating his sx's well. \    03/31/18 02/18/18 06/17/17 12/11/16 06/06/16 11/30/15 06/02/15 03/09/15  Total PSA 5.6 ng/dl 6.1 ng/dl 4.2 ng/dl 4.6 ng/dl 5.1 ng/dl 5.0  5.47  4.59      06/17/17 12/11/16 06/06/16 06/02/15 03/09/15 10/28/14  Testosterone, Total 493 pg/dL 538 pg/dL 629 pg/dL 752  392  211     Hypogonadism:  He has not been on clomiphene, but on anastrazole (semingly given due to backorder of clomiophene). He has stable libido.   10.27.2020: He continues on anastrazole 3x week. Most recent T was 522.   ED:  On sildenafil 100 mg prn--this works fine.   Past Medical History:  Diagnosis Date  . Anxiety   . 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 Carotid artery occlusion    right  . Chronic pain disorder    left arm, shoulder and chest ;  thoracic syndrome  . Collapsed lung 2010  . Depression   . Fibromyalgia    "left shoulder area" (10/24/2011)  . GERD (gastroesophageal reflux disease)   . History of blood transfusion 2010  . History of thoracic outlet syndrome 2008   rib removed in 2009  . Insomnia due to medical condition   . Neuromuscular disorder ([place])   . Neuropathy of hand    left  . Pneumonia 2010  . PONV (postoperative nausea and vomiting)   . Prostate cancer ([place])   . 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 RSD (reflex sympathetic dystrophy)    "left shoulder and left arm" (10/24/2011)  . Sleep apnea    could not tolerate CPAP, PCP aware  . Snoring       Past Surgical History:  Procedure Laterality Date  . ANTERIOR CERVICAL DECOMP/DISCECTOMY FUSION  10/24/2011  . ANTERIOR CERVICAL DECOMP/DISCECTOMY FUSION  10/24/2011   Procedure: ANTERIOR CERVICAL DECOMPRESSION/DISCECTOMY FUSION 2 LEVELS;  Surgeon: Melina Schools, MD;  Location: [place];  Service: Orthopedics;  Laterality: N/A;  ACDF C3-5 C-ARM SKYTRON TABLE SYNTHES VECTOR TITAN CAGE NEURO MONITORING SYTEM  . BIOPSY N/A 02/25/2017   Procedure: BIOPSY DIAPHRAGMATIC PLAQUE;  Surgeon: Grace Isaac, MD;  Location: [place];  Service: Thoracic;  Laterality: N/A;  . COLONOSCOPY WITH PROPOFOL N/A 08/01/2017   Procedure: COLONOSCOPY WITH PROPOFOL;  Surgeon: Rogene Houston, MD;  [facility];  Service: Endoscopy;  Laterality: N/A;  11:15  . CYSTECTOMY     left hand x 3  . DECORTICATION Left 02/25/2017   Procedure: DECORTICATION;  Surgeon: Grace Isaac, MD;  Location: [place];  Service: Thoracic;  Laterality: Left;  . EMPYEMA DRAINAGE Left 02/25/2017   Procedure: LEFT EMPYEMA DRAINAGE;  Surgeon: Grace Isaac, MD;  Location: [place];  Service: Thoracic;  Laterality: Left;  . EYE SURGERY     lasix  . KNEE ARTHROSCOPY  1987   right  . POLYPECTOMY  08/01/2017   Procedure: POLYPECTOMY;  Surgeon: Rogene Houston, MD;  [facility];  Service: Endoscopy;;  colon  . REFRACTIVE SURGERY  2003   bilaterally  . RIB RESECTION  2009   "left anterior" (10/24/2011)  . VASCULAR SURGERY     to remove  blood clots  . VIDEO ASSISTED THORACOSCOPY (VATS)/EMPYEMA Left 02/25/2017   Procedure: LEFT VIDEO ASSISTED THORACOSCOPY (VATS);  Surgeon: Grace Isaac, MD;  Location: [place];  Service: Thoracic;  Laterality: Left;  Marland Kitchen VIDEO BRONCHOSCOPY N/A 02/25/2017   Procedure: VIDEO BRONCHOSCOPY;  Surgeon: Grace Isaac, MD;  Location: [facility];  Service: Thoracic;  Laterality: N/A;    Home Medications:  Allergies as of 06/29/2019   No Known Allergies     Medication List        Accurate as of June 29, 2019 10:33 AM. If you have any questions, ask your nurse or doctor.        ALPRAZolam 0.5 MG tablet Commonly known as: XANAX Take 0.5 mg by mouth 2 (two) times daily.   anastrozole 1 MG tablet Commonly known as: ARIMIDEX Take 1 tablet (1 mg total) by mouth 3 (three) times a week. Sunday, Tuesday and Friday   aspirin EC 81 MG tablet Take 1 tablet (81 mg total) by mouth daily.   celecoxib 200 MG capsule Commonly known as: CELEBREX   cetirizine 10 MG tablet Commonly known as: ZYRTEC Take 10 mg by mouth daily.   Cinnamon  500 MG capsule Take 500 mg by mouth daily.   Coenzyme Q10 200 MG capsule Take 200 mg by mouth daily.   fentaNYL 12 MCG/HR Commonly known as: DURAGESIC Place 12 mcg onto the skin every other day.   Krill Oil 500 MG Caps Take 500 mg by mouth daily.   mirtazapine 15 MG tablet Commonly known as: REMERON Take 15 mg by mouth at bedtime.   mometasone 50 MCG/ACT nasal spray Commonly known as: NASONEX Place 2 sprays into the nose daily as needed.   pregabalin 75 MG capsule Commonly known as: LYRICA Take 75-150 mg by mouth See admin instructions. Take 150 mg by mouth in the morning and take 75 mg by mouth at supper   PROBIOTIC DAILY PO Take 1 capsule by mouth daily.   RABEprazole 20 MG tablet Commonly known as: ACIPHEX Take 20 mg by mouth daily. PRN   saw palmetto 160 MG capsule Take 160 mg by mouth daily.   SUPER B COMPLEX/C PO Take 1 tablet by mouth daily.   tamsulosin 0.4 MG Caps capsule Commonly known as: FLOMAX Take 0.8 mg by mouth daily.   traZODone 50 MG tablet Commonly known as: DESYREL Take 50 mg by mouth at bedtime.   Turmeric 500 MG Caps Take 1 capsule by mouth daily.       Allergies: No Known Allergies  Family History  Problem Relation Age of Onset  . Diabetes Mother     Social History:  reports that he quit smoking about 14 years ago. His smoking use included cigarettes. He has a 30.00 pack-year  smoking history. His smokeless tobacco use includes chew. He reports that he does not drink alcohol and does not use drugs.  ROS: Urological Symptom Review  Patient is experiencing the following symptoms: none  Review of Systems Gastrointestinal (upper)  : Negative for upper GI symptoms Gastrointestinal (lower) : Negative for lower GI symptoms Constitutional : Negative for symptoms Skin: Negative for skin symptoms Eyes: Negative for eye symptoms Ear/Nose/Throat : Negative for Ear/Nose/Throat symptoms Hematologic/Lymphatic: Negative for Hematologic/Lymphatic symptoms Cardiovascular : Negative for cardiovascular symptoms Respiratory : Negative for respiratory symptoms Endocrine: Negative for endocrine symptoms Musculoskeletal: Negative for musculoskeletal symptoms Neurological: Negative for neurological symptoms Psychologic: Negative for psychiatric symptoms  Physical Exam:  Vital signs in last 24 hours: BP 127/70   Pulse 80   Temp 97.7 F (36.5 C)   Ht 6\' 1"  (1.854 m)   Wt 192 lb (87.1 kg)   BMI 25.33 kg/m  Constitutional:  Alert and oriented, No acute distress Cardiovascular: Regular rate  Respiratory: Normal respiratory effort GI: Abdomen is soft, nontender, nondistended, no abdominal masses. No CVAT. No hernias.   Genitourinary: Normal male phallus (uncircumcised), testes are descended bilaterally and non-tender and without masses, scrotum is normal in appearance without lesions or masses, perineum is normal on inspection. Prostate feels around 50 g in size, no nodularity.  Lymphatic: No lymphadenopathy Neurologic: Grossly intact, no focal deficits Psychiatric: Normal mood and affect  Laboratory Data:  No results for input(s): WBC, HGB, HCT, PLT in the last 72 hours.  No results for input(s): NA, K, CL, GLUCOSE, BUN, CALCIUM, CREATININE in the last 72 hours.  Invalid input(s): CO3   Results for orders placed or performed in visit on 06/29/19 (from the  past 24 hour(s))  POCT urinalysis dipstick     Status: Abnormal   Collection Time: 06/29/19 10:17 AM  Result Value Ref Range   Color, UA dark yellow    Clarity,  UA     Glucose, UA Negative Negative   Bilirubin, UA neg    Ketones, UA neg    Spec Grav, UA 1.015 1.010 - 1.025   Blood, UA neg    pH, UA 6.0 5.0 - 8.0   Protein, UA Positive (A) Negative   Urobilinogen, UA 0.2 0.2 or 1.0 E.U./dL   Nitrite, UA neg    Leukocytes, UA Negative Negative   Appearance clear    Odor     No results found for this or any previous visit (from the past 240 hour(s)).  I have reviewed prior pt notes  I have reviewed notes from referring/previous physicians  I have reviewed urinalysis results  I have independently reviewed prior imaging  I have reviewed prior PSA results  Impression/Assessment:  Stable urinary sx's without significant complaint. PSA is slightly elevated but largely stable. He did not have testosterone check but he reports having had stable energy levels.    Plan:  1. Labs in 6 mo w/ PSA, T, BUN/Cs  2. Continue on tamsulosin -- advised to discontinue saw palmetto   3. Prostate MRI before end of year -- will call to schedule. Will follow-up with fusion bx if positive, otherwise will follow-up with routine TRUSP/bx.

## 2019-06-29 NOTE — Progress Notes (Signed)
See progress note.

## 2019-07-06 ENCOUNTER — Ambulatory Visit: Payer: Commercial Managed Care - PPO | Admitting: Urology

## 2019-07-10 IMAGING — CT CT CHEST W/ CM
2 of 3 series · 15 of 36 positions shown, 18 images · IV contrast (iopamidol)
Comparison: Chest x-ray 02/21/2017

CLINICAL DATA: 61-year-old male with a history of pneumonia
02/21/2017

EXAM:
CT CHEST WITH CONTRAST
TECHNIQUE: Multidetector CT imaging of the chest was performed during
intravenous contrast administration.
CONTRAST:  75mL 90SGJH-YDD IOPAMIDOL (90SGJH-YDD) INJECTION 61%

[Series 2: axial st · axial · 0.84mm/px · z∈[+1996,+2280]mm · 12 of 168 slices shown, 15 images]
[im 13/168  mediastinal]
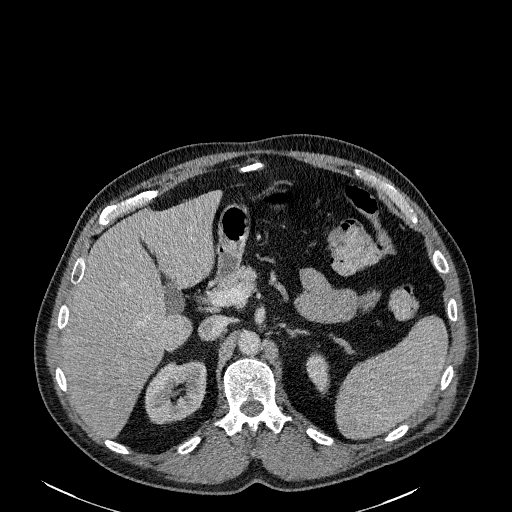
[im 13/168  lung]
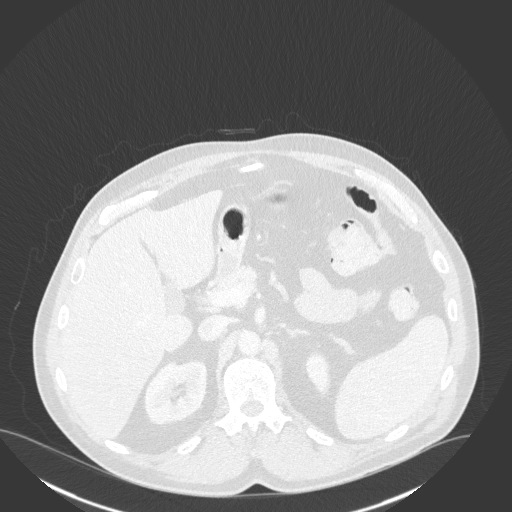
[im 25/168  lung]
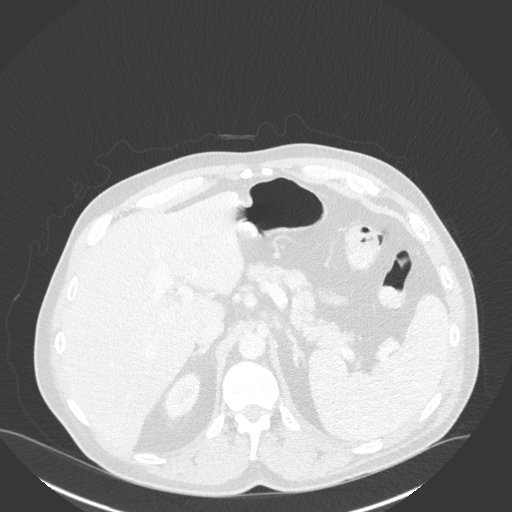
[im 38/168  lung]
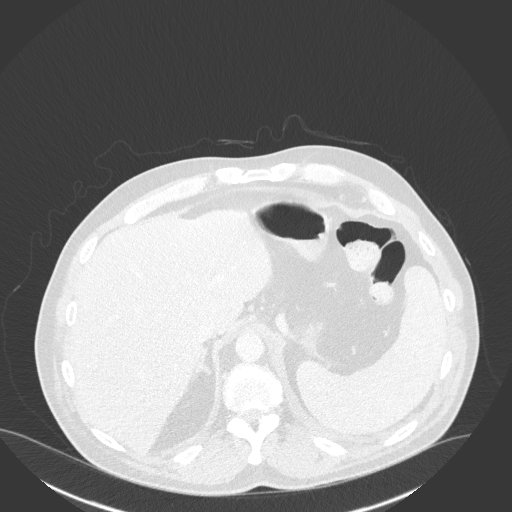
[im 50/168  lung]
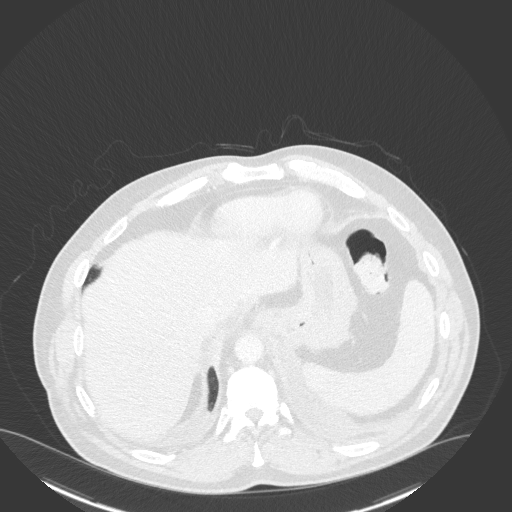
[im 62/168  mediastinal]
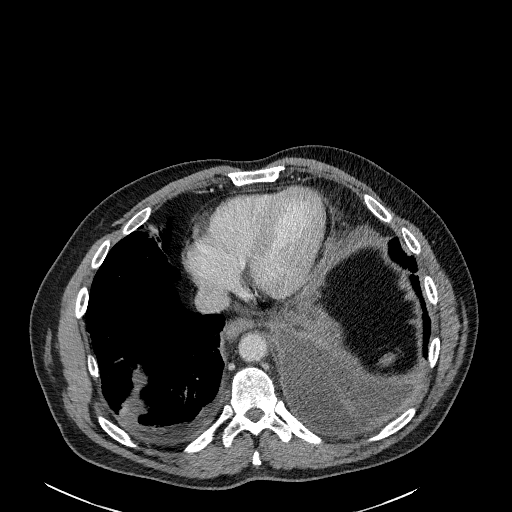
[im 62/168  lung]
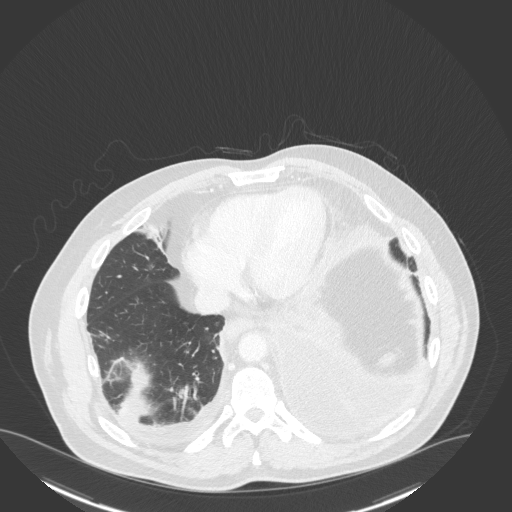
[im 75/168  lung]
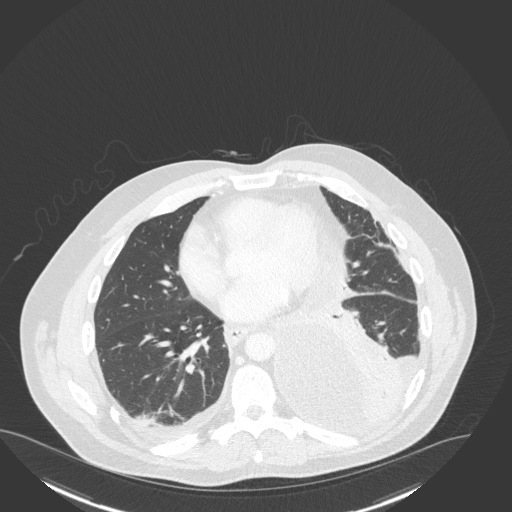
[im 93/168  lung]
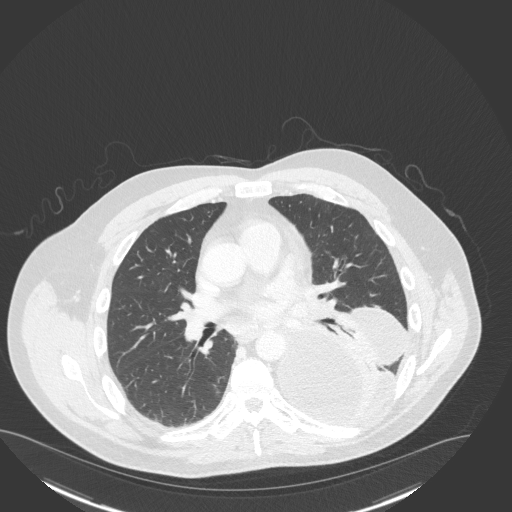
[im 106/168  lung]
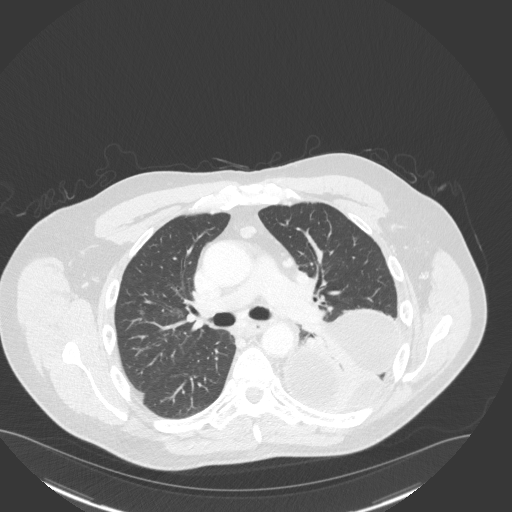
[im 118/168  mediastinal]
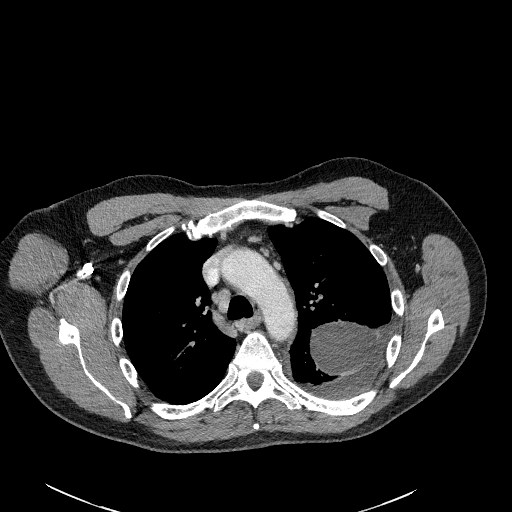
[im 118/168  lung]
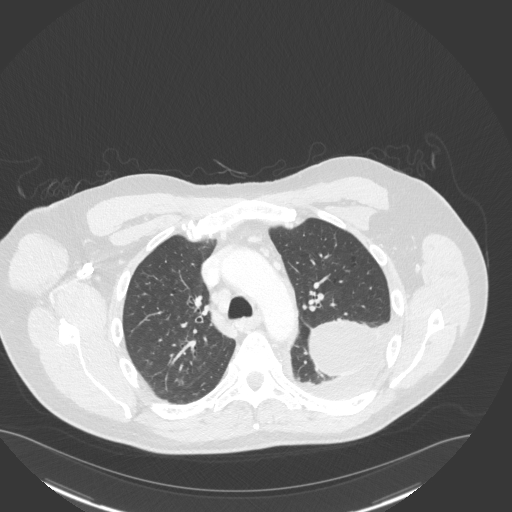
[im 130/168  lung]
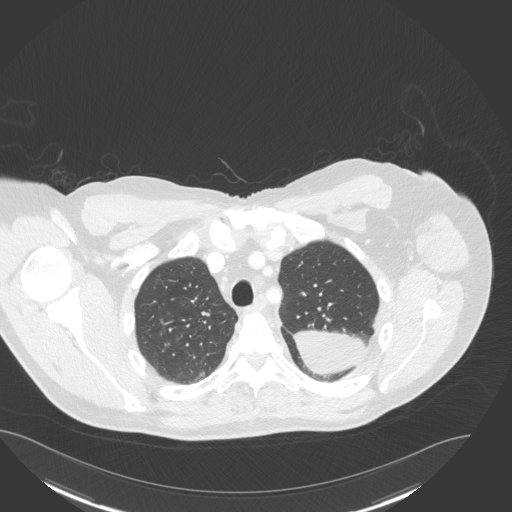
[im 143/168  lung]
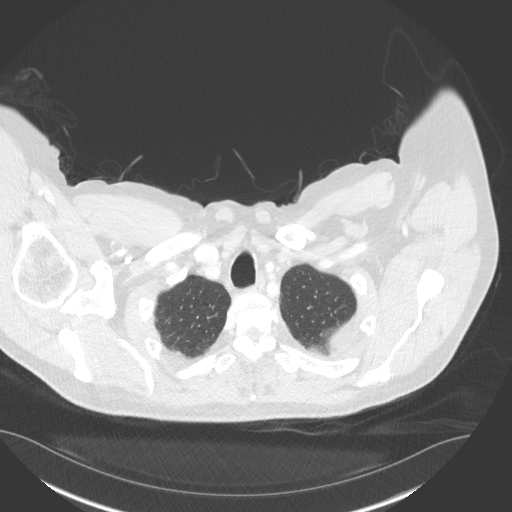
[im 155/168  lung]
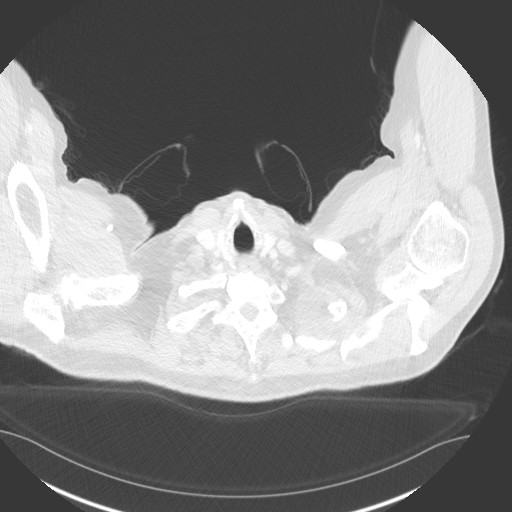

[Series 5: coronal · coronal · 0.72mm/px · 3 of 140 slices shown]
[im 28/140  lung]
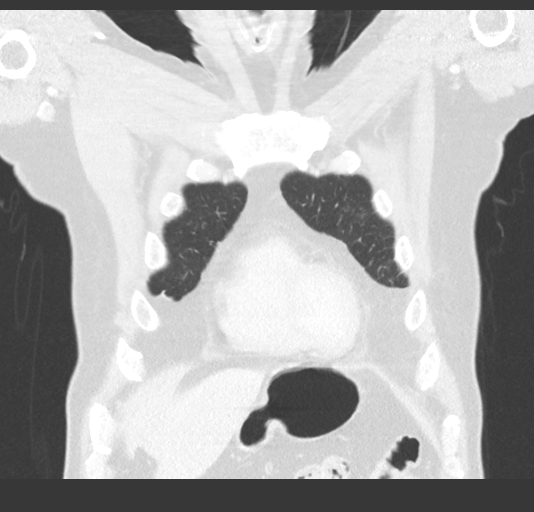
[im 56/140  lung]
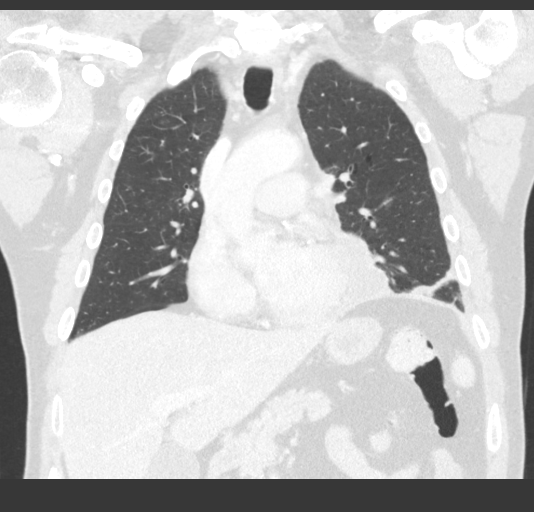
[im 84/140  lung]
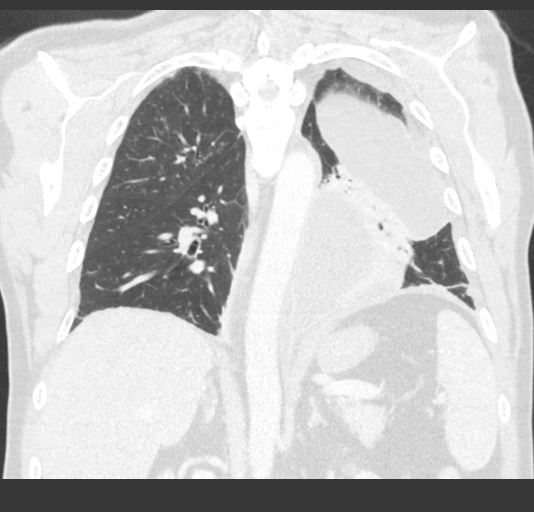

[15 of 36 positions shown; findings below may reference images not displayed]

FINDINGS: Cardiovascular: Heart size within normal limits. Calcifications of
the left main, left anterior descending coronary arteries. Minimal
calcifications of the aortic arch and the branch vessels,
specifically left subclavian artery.

No aneurysm.  No dissection.

Pulmonary arteries not well evaluated given the timing of the
contrast bolus.

Mediastinum/Nodes: Multiple borderline enlarged lymph nodes
throughout the mediastinum, including left hilar, subcarinal,
paratracheal nodal stations. Index lymph node on the right in the
right paratracheal nodal station measures 16 mm. Peribronchial lymph
node on the left measures 14 mm. Unremarkable thoracic esophagus.

Lungs/Pleura: Significant increase volume of left-sided pleural
fluid, which is loculated within the left fissure, and at the left
inferior pleural space with scalloping of the lung, compressive
atelectasis, with the largest component in the subpulmonic region.
Inflammatory change in thickening along the diaphragm anteriorly.
Loculated fluid extending towards the left hilum.

Evidence of pleural enhancement at the lung base.

There is trace right-sided pleural fluid, in the costophrenic
sulcus.

Pattern of centrilobular nodularity in predominantly
peribronchovascular distribution, mostly of the right upper lobe.

Upper Abdomen: No acute

Musculoskeletal: No acute displaced fracture.
IMPRESSION: Developing left-sided empyema, with significant increase loculated
pleural fluid compared to the prior chest x-ray, and associated
pleural enhancement.

Reactive mediastinal adenopathy.

Mild centrilobular nodularity of predominantly the right upper lobe,
compatible with infection

Small right-sided pleural effusion.

Two vessel coronary artery disease.

These results were called by telephone at the time of interpretation
on 02/24/2017 at [DATE] to Dr. GAB TIGER , who verbally
acknowledged these results.

## 2019-07-11 IMAGING — DX DG CHEST 1V PORT
1 series · 1 of 1 positions shown · non-contrast
Comparison: CT 02/24/2017.

CLINICAL DATA: Left-sided chest surgery.  Empyema.

EXAM:
PORTABLE CHEST 1 VIEW

[chest ap]
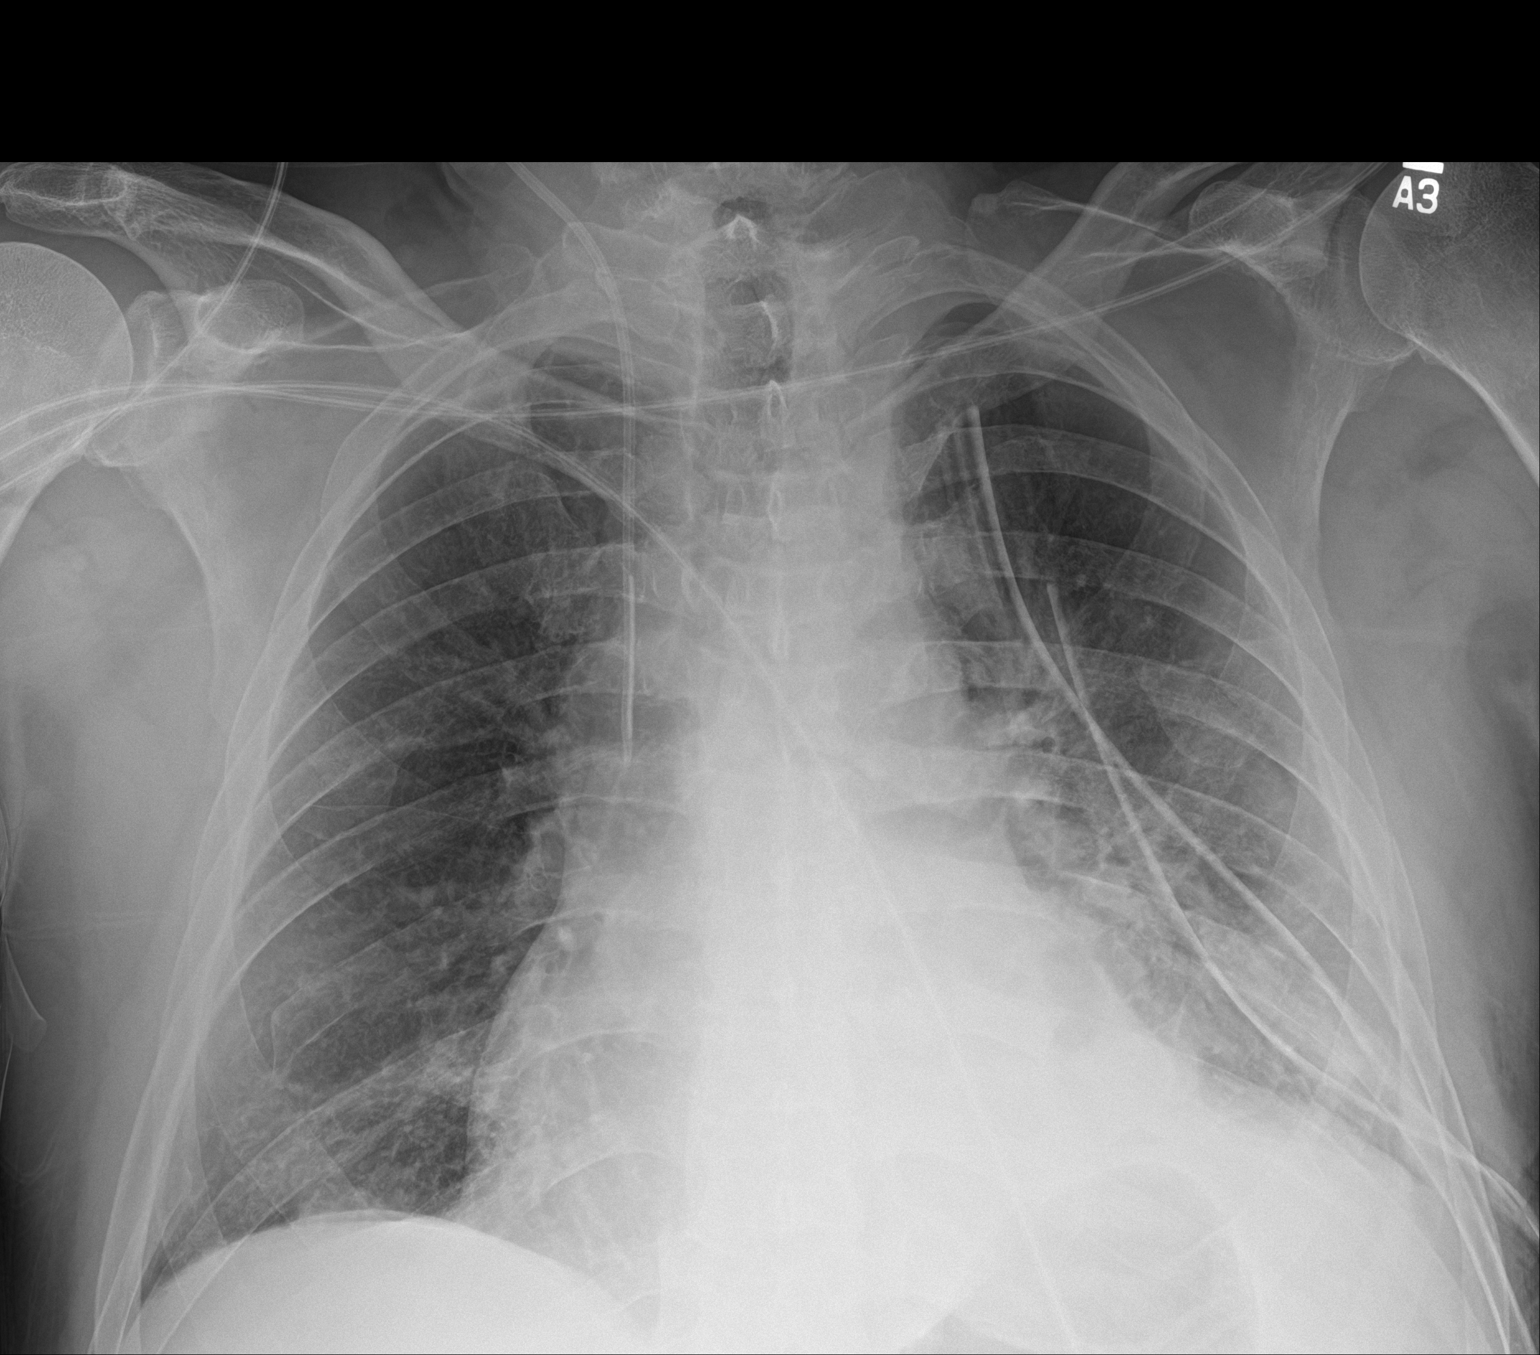

[1 of 1 positions shown; findings below may reference images not displayed]

FINDINGS: Right IJ line noted with tip over superior vena cava. Two left chest
tubes noted. Tiny left apical pneumothorax. Left lower lobe
atelectasis/infiltrate. Small left pleural effusion. Previously
identified large left pleural fluid collection has been removed.
Prior cervical spine fusion. Cardiomegaly with normal pulmonary
vascularity. Postsurgical changes right eighth rib.
IMPRESSION: 1. Right IJ line noted with tip over superior vena cava. Two left
chest tubes noted. Tiny left apical pneumothorax. Previously
identified large left pleural fluid collection has been removed.

2.  Left base atelectasis/infiltrate.  Small left pleural effusion.

3.  Cardiomegaly.  No pulmonary venous congestion.

Critical Value/emergent results were called by telephone at the time
of interpretation on 02/25/2017 at [DATE] to nurse Jumper , who
verbally acknowledged these results.

## 2019-11-03 ENCOUNTER — Encounter (INDEPENDENT_AMBULATORY_CARE_PROVIDER_SITE_OTHER): Payer: Medicare Other

## 2019-11-03 ENCOUNTER — Ambulatory Visit (INDEPENDENT_AMBULATORY_CARE_PROVIDER_SITE_OTHER): Payer: Medicare Other | Admitting: Nurse Practitioner

## 2019-11-04 ENCOUNTER — Other Ambulatory Visit (INDEPENDENT_AMBULATORY_CARE_PROVIDER_SITE_OTHER): Payer: Self-pay | Admitting: Nurse Practitioner

## 2019-11-04 DIAGNOSIS — I6523 Occlusion and stenosis of bilateral carotid arteries: Secondary | ICD-10-CM

## 2019-11-05 ENCOUNTER — Encounter (INDEPENDENT_AMBULATORY_CARE_PROVIDER_SITE_OTHER): Payer: Self-pay | Admitting: Nurse Practitioner

## 2019-11-05 ENCOUNTER — Other Ambulatory Visit: Payer: Self-pay

## 2019-11-05 ENCOUNTER — Ambulatory Visit (INDEPENDENT_AMBULATORY_CARE_PROVIDER_SITE_OTHER): Payer: Commercial Managed Care - PPO | Admitting: Nurse Practitioner

## 2019-11-05 ENCOUNTER — Ambulatory Visit (INDEPENDENT_AMBULATORY_CARE_PROVIDER_SITE_OTHER): Payer: Commercial Managed Care - PPO

## 2019-11-05 VITALS — BP 126/71 | HR 92 | Resp 16 | Wt 197.6 lb

## 2019-11-05 DIAGNOSIS — J869 Pyothorax without fistula: Secondary | ICD-10-CM

## 2019-11-05 DIAGNOSIS — I6523 Occlusion and stenosis of bilateral carotid arteries: Secondary | ICD-10-CM | POA: Diagnosis not present

## 2019-11-05 NOTE — Progress Notes (Signed)
Subjective:    Patient ID: Kenneth [name], male    DOB: 07-31-1955, 64 y.o.   MRN: [id number] Chief Complaint  Patient presents with  . Follow-up    ultrasound follow up    The patient is seen for follow up evaluation of carotid stenosis. The carotid stenosis followed by ultrasound.   The patient denies amaurosis fugax. There is no recent history of TIA symptoms or focal motor deficits. There is no prior documented CVA.  The patient is taking enteric-coated aspirin 81 mg daily.  There is no history of migraine headaches. There is no history of seizures.  The patient has a history of coronary artery disease, no recent episodes of angina or shortness of breath. The patient denies PAD or claudication symptoms. There is a history of hyperlipidemia which is being treated with a statin.    Duplex ultrasound shows right ICA occlusion which is known with left being 1 to 39% stenosis.   Review of Systems  Eyes: Negative for visual disturbance.  Respiratory: Negative for choking and shortness of breath.   All other systems reviewed and are negative.      Objective:   Physical Exam Vitals reviewed.  HENT:     Head: Normocephalic.  Neck:     Vascular: No carotid bruit.  Cardiovascular:     Rate and Rhythm: Normal rate and regular rhythm.     Pulses: Normal pulses.     Heart sounds: Normal heart sounds.  Pulmonary:     Effort: Pulmonary effort is normal.     Breath sounds: Normal breath sounds.  Neurological:     Mental Status: He is alert and oriented to person, place, and time.  Psychiatric:        Mood and Affect: Mood normal.        Behavior: Behavior normal.        Thought Content: Thought content normal.        Judgment: Judgment normal.     BP 126/71 (BP Location: Right Arm)   Pulse 92   Resp 16   Wt 197 lb 9.6 oz (89.6 kg)   BMI 26.07 kg/m   Past Medical History:  Diagnosis Date  . Anxiety   . 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 Carotid artery occlusion     right  . Chronic pain disorder    left arm, shoulder and chest ;thoracic syndrome  . Collapsed lung 2010  . Depression   . Fibromyalgia    "left shoulder area" (10/24/2011)  . GERD (gastroesophageal reflux disease)   . History of blood transfusion 2010  . History of thoracic outlet syndrome 2008   rib removed in 2009  . Insomnia due to medical condition   . Neuromuscular disorder ([place])   . Neuropathy of hand    left  . Pneumonia 2010  . PONV (postoperative nausea and vomiting)   . Prostate cancer ([place])   . 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 RSD (reflex sympathetic dystrophy)    "left shoulder and left arm" (10/24/2011)  . Sleep apnea    could not tolerate CPAP, PCP aware  . Snoring     Social History   Socioeconomic History  . Marital status: Married    Spouse name: Not on file  . Number of children: 2  . Years of education: College  . Highest education level: Not on file  Occupational History  . Occupation: Disabled  Tobacco Use  .  Smoking status: Former Smoker    Packs/day: 1.00    Years: 30.00    Pack years: 30.00    Types: Cigarettes    Quit date: 10/20/2004    Years since quitting: 15.0  . Smokeless tobacco: Current User    Types: Chew  Vaping Use  . Vaping Use: Never used  Substance and Sexual Activity  . Alcohol use: No    Alcohol/week: 0.0 standard drinks  . Drug use: No  . Sexual activity: Yes  Other Topics Concern  . Not on file  Social History Narrative   Drinks 2 sodas a day   Social Determinants of Health   Financial Resource Strain:   . Difficulty of Paying Living Expenses: Not on file  Food Insecurity:   . Worried About Charity fundraiser in the Last Year: Not on file  . Ran Out of Food in the Last Year: Not on file  Transportation Needs:   . Lack of Transportation (Medical): Not on file  . Lack of Transportation (Non-Medical): Not on file  Physical Activity:   . Days of Exercise per Week: Not on file  . Minutes  of Exercise per Session: Not on file  Stress:   . Feeling of Stress : Not on file  Social Connections:   . Frequency of Communication with Friends and Family: Not on file  . Frequency of Social Gatherings with Friends and Family: Not on file  . Attends Religious Services: Not on file  . Active Member of Clubs or Organizations: Not on file  . Attends Archivist Meetings: Not on file  . Marital Status: Not on file  Intimate Partner Violence:   . Fear of Current or Ex-Partner: Not on file  . Emotionally Abused: Not on file  . Physically Abused: Not on file  . Sexually Abused: Not on file    Past Surgical History:  Procedure Laterality Date  . ANTERIOR CERVICAL DECOMP/DISCECTOMY FUSION  10/24/2011  . ANTERIOR CERVICAL DECOMP/DISCECTOMY FUSION  10/24/2011   Procedure: ANTERIOR CERVICAL DECOMPRESSION/DISCECTOMY FUSION 2 LEVELS;  Surgeon: Melina Schools, MD;  Location: [place];  Service: Orthopedics;  Laterality: N/A;  ACDF C3-5 C-ARM SKYTRON TABLE SYNTHES VECTOR TITAN CAGE NEURO MONITORING SYTEM  . BIOPSY N/A 02/25/2017   Procedure: BIOPSY DIAPHRAGMATIC PLAQUE;  Surgeon: Grace Isaac, MD;  Location: [place];  Service: Thoracic;  Laterality: N/A;  . COLONOSCOPY WITH PROPOFOL N/A 08/01/2017   Procedure: COLONOSCOPY WITH PROPOFOL;  Surgeon: Rogene Houston, MD;  [facility];  Service: Endoscopy;  Laterality: N/A;  11:15  . CYSTECTOMY     left hand x 3  . DECORTICATION Left 02/25/2017   Procedure: DECORTICATION;  Surgeon: Grace Isaac, MD;  Location: [place];  Service: Thoracic;  Laterality: Left;  . EMPYEMA DRAINAGE Left 02/25/2017   Procedure: LEFT EMPYEMA DRAINAGE;  Surgeon: Grace Isaac, MD;  Location: [place];  Service: Thoracic;  Laterality: Left;  . EYE SURGERY     lasix  . KNEE ARTHROSCOPY  1987   right  . POLYPECTOMY  08/01/2017   Procedure: POLYPECTOMY;  Surgeon: Rogene Houston, MD;  [facility];  Service: Endoscopy;;  colon  .  REFRACTIVE SURGERY  2003   bilaterally  . RIB RESECTION  2009   "left anterior" (10/24/2011)  . VASCULAR SURGERY     to remove  blood clots  . VIDEO ASSISTED THORACOSCOPY (VATS)/EMPYEMA Left 02/25/2017   Procedure: LEFT VIDEO ASSISTED THORACOSCOPY (VATS);  Surgeon:  Grace Isaac, MD;  Location: [place];  Service: Thoracic;  Laterality: Left;  Marland Kitchen VIDEO BRONCHOSCOPY N/A 02/25/2017   Procedure: VIDEO BRONCHOSCOPY;  Surgeon: Grace Isaac, MD;  [facility];  Service: Thoracic;  Laterality: N/A;    Family History  Problem Relation Age of Onset  . Diabetes Mother     No Known Allergies     Assessment & Plan:   1. Bilateral carotid artery stenosis Recommend:  Given the patient's asymptomatic subcritical stenosis no further invasive testing or surgery at this time.  Duplex ultrasound shows right ICA occlusion which is known with left being 1 to 39% stenosis.  Continue antiplatelet therapy as prescribed Continue management of CAD, HTN and Hyperlipidemia Healthy heart diet,  encouraged exercise at least 4 times per week Follow up in 12 months with duplex ultrasound and physical exam    2. Empyema [facility]) Patient has not had any further recurrences last year.  Patient will continue with prescribed management follow-up with PCP regarding this issue.   Current Outpatient Medications on File Prior to Visit  Medication Sig Dispense Refill  . ALPRAZolam (XANAX) 0.5 MG tablet Take 0.5 mg by mouth 2 (two) times daily.   5  . anastrozole (ARIMIDEX) 1 MG tablet Take 1 tablet (1 mg total) by mouth 3 (three) times a week. Sunday, Tuesday and Friday 30 tablet 5  . aspirin EC 81 MG tablet Take 1 tablet (81 mg total) by mouth daily.    . celecoxib (CELEBREX) 200 MG capsule     . cetirizine (ZYRTEC) 10 MG tablet Take 10 mg by mouth daily.    . Cinnamon 500 MG capsule Take 500 mg by mouth daily.     . Coenzyme Q10 200 MG capsule Take 200 mg by mouth daily.    . fentaNYL (DURAGESIC - DOSED  MCG/HR) 12 MCG/HR Place 12 mcg onto the skin every other day.     Javier Docker Oil 500 MG CAPS Take 500 mg by mouth daily.     . mirtazapine (REMERON) 15 MG tablet Take 15 mg by mouth at bedtime.    . mometasone (NASONEX) 50 MCG/ACT nasal spray Place 2 sprays into the nose daily as needed.  12  . pregabalin (LYRICA) 75 MG capsule Take 75-150 mg by mouth See admin instructions. Take 150 mg by mouth in the morning and take 75 mg by mouth at supper    . Probiotic Product (PROBIOTIC DAILY PO) Take 1 capsule by mouth daily.     . RABEprazole (ACIPHEX) 20 MG tablet Take 20 mg by mouth daily. PRN    . saw palmetto 160 MG capsule Take 160 mg by mouth daily.    . SUPER B COMPLEX/C PO Take 1 tablet by mouth daily.    . tamsulosin (FLOMAX) 0.4 MG CAPS capsule Take 0.8 mg by mouth daily.   11  . traZODone (DESYREL) 50 MG tablet Take 50 mg by mouth at bedtime.   1  . Turmeric 500 MG CAPS Take 1 capsule by mouth daily.     No current facility-administered medications on file prior to visit.    There are no Patient Instructions on file for this visit. No follow-ups on file.   Kris Hartmann, NP

## 2019-11-06 ENCOUNTER — Encounter (INDEPENDENT_AMBULATORY_CARE_PROVIDER_SITE_OTHER): Payer: Self-pay | Admitting: Nurse Practitioner

## 2020-01-05 ENCOUNTER — Ambulatory Visit (HOSPITAL_COMMUNITY)
Admission: RE | Admit: 2020-01-05 | Discharge: 2020-01-05 | Disposition: A | Discharge status: Home / Self Care | Payer: Commercial Managed Care - PPO | Source: Ambulatory Visit | Attending: Urology | Admitting: Urology

## 2020-01-05 ENCOUNTER — Encounter (HOSPITAL_COMMUNITY): Payer: Self-pay

## 2020-01-05 ENCOUNTER — Other Ambulatory Visit: Payer: Self-pay

## 2020-01-05 DIAGNOSIS — C61 Malignant neoplasm of prostate: Secondary | ICD-10-CM

## 2020-01-05 NOTE — Progress Notes (Signed)
Pt unwilling to do endorectal coil Prostate MRI without correct prep. Needs proper medication & prep beforehand. Will need to reschedule.

## 2020-01-17 ENCOUNTER — Other Ambulatory Visit: Payer: Self-pay | Admitting: Urology

## 2020-01-17 DIAGNOSIS — C61 Malignant neoplasm of prostate: Secondary | ICD-10-CM

## 2020-01-21 ENCOUNTER — Telehealth: Payer: Self-pay

## 2020-01-21 NOTE — Telephone Encounter (Signed)
Returned patient call. No answer.   Pt needs instructions for MRI endo rectal  1; no ejaculation for 48 hours prior  2; no sex 48 hours prior  3; no caffeine 48 hours prior  4. Fleet enema to be given 2 hours prior  5. Valium to be prescribed.   Left message for pt to return call.

## 2020-01-24 ENCOUNTER — Other Ambulatory Visit: Payer: Self-pay | Admitting: Urology

## 2020-01-24 DIAGNOSIS — C61 Malignant neoplasm of prostate: Secondary | ICD-10-CM

## 2020-01-24 MED ORDER — VALIUM 5 MG PO TABS: 10.0000 mg | ORAL_TABLET | Freq: Four times a day (QID) | ORAL | 0 refills | Status: AC | PRN

## 2020-01-24 NOTE — Telephone Encounter (Signed)
I sent diazepam in

## 2020-01-24 NOTE — Telephone Encounter (Signed)
Pharmacy called saying dr had sent rx for valium and it had to be diazepam instead. A verbal order was given to pharmacist to change to diazepam.  During this pt called saying he had went to [facility] for MRI and they told him he should have had some prepping that wasn't done. The procedure was cancelled. Pt asked  what he needed to do. Went over instructions with pt. Saw in notes Estill Bamberg RN had called pt to go over instructions but he did not get  message until after office had closed that evening.

## 2020-01-28 ENCOUNTER — Other Ambulatory Visit: Payer: Self-pay

## 2020-01-28 ENCOUNTER — Ambulatory Visit (HOSPITAL_COMMUNITY)
Admission: RE | Admit: 2020-01-28 | Discharge: 2020-01-28 | Disposition: A | Discharge status: Home / Self Care | Payer: Commercial Managed Care - PPO | Source: Ambulatory Visit | Attending: Urology | Admitting: Urology

## 2020-01-28 DIAGNOSIS — C61 Malignant neoplasm of prostate: Secondary | ICD-10-CM | POA: Diagnosis present

## 2020-01-28 MED ORDER — GADOBUTROL 1 MMOL/ML IV SOLN
9.0000 mL | Freq: Once | INTRAVENOUS | Status: AC | PRN
  Administered 2020-01-28: 9 mL via INTRAVENOUS

## 2020-02-01 ENCOUNTER — Other Ambulatory Visit: Payer: Self-pay | Admitting: Urology

## 2020-02-01 DIAGNOSIS — C61 Malignant neoplasm of prostate: Secondary | ICD-10-CM

## 2020-02-01 MED ORDER — LEVOFLOXACIN 750 MG PO TABS: 750.0000 mg | ORAL_TABLET | Freq: Every day | ORAL | 0 refills | Status: AC

## 2020-02-02 ENCOUNTER — Telehealth: Payer: Self-pay

## 2020-02-02 NOTE — Telephone Encounter (Signed)
Called pt. No answer. Left message to return call 

## 2020-02-02 NOTE — Telephone Encounter (Signed)
-----   Message from Franchot Gallo, MD sent at 02/01/2020 12:13 PM EST ----- Please call pt--MRI showed no areas suspicious for aggressive PCa--nothing that looks like cancer outside of prostate. I would recommend still proceeding w/ TRUS/Bx as discussed--I put orders in ----- Message ----- From: Dorisann Frames, RN Sent: 01/31/2020   8:48 AM EST To: Franchot Gallo, MD  Please review

## 2020-02-02 NOTE — Telephone Encounter (Signed)
Made pt aware of Dr. Diona Fanti s suggestion to have biopsy. Pt said he would rather talk with dr. first. Appt was made.

## 2020-02-08 ENCOUNTER — Ambulatory Visit (INDEPENDENT_AMBULATORY_CARE_PROVIDER_SITE_OTHER): Payer: Commercial Managed Care - PPO | Admitting: Urology

## 2020-02-08 ENCOUNTER — Ambulatory Visit: Payer: Commercial Managed Care - PPO | Admitting: Urology

## 2020-02-08 ENCOUNTER — Encounter: Payer: Self-pay | Admitting: Urology

## 2020-02-08 ENCOUNTER — Other Ambulatory Visit: Payer: Self-pay

## 2020-02-08 VITALS — BP 145/73 | HR 87 | Temp 98.6°F | Ht 74.0 in | Wt 196.0 lb

## 2020-02-08 DIAGNOSIS — N138 Other obstructive and reflux uropathy: Secondary | ICD-10-CM

## 2020-02-08 DIAGNOSIS — N401 Enlarged prostate with lower urinary tract symptoms: Secondary | ICD-10-CM

## 2020-02-08 MED ORDER — TADALAFIL 5 MG PO TABS: 5.0000 mg | ORAL_TABLET | ORAL | 11 refills | Status: DC | PRN

## 2020-02-08 NOTE — Progress Notes (Signed)
History of Present Illness: 65 year old male here today for follow-up of hypogonadism, prostate cancer, erectile dysfunction.  .    Prostate Cancer: .   11.29.2016: TRUS/Bx . At that time, PSA was 3.68. Prostatic volume was 60 mL, PSAD 0.06. 1/12 cores were positive-his core in the right apex revealed GS 3+3 adenocarcinoma in 10% of the core. Because of the small volume of prostate cancer, we could not get a reading from Oncotype DX.   11.2.2017: prostate MRI-revealed findings of a normal prostate without evidence of high-grade lesions. No NVI/SV invasion/capsular penetration/pelvic adenopathy/bony mets.   11.28.2017: Surveillance TRUS/Bx. At that time, PSA was 5.0, prostatic volume 63 mL, PSA density 0.08. All 12 cores were negative for cancer.   10.27.2020: Most recent PSA 5.1 (testosterone 522). His urinary pattern has remained stable and he is tolerating his sx's well.   6.15.2021: Most recent PSA 6.2 (6.9.2021). Overall, he feels like he is doing well and reports stable urological symptomatology. He continues on tamsulosin and finds this effective. He denies any specific complaints at present though does express some displeasure with being on such a large number of medications. His energy levels have remained stable -- no recent testosterone measurement  1.17.2022: MRI of prostate--No radiographic evidence of high-grade prostate carcinoma. PI-RADS 1: Very Low (clinically significant cancer is highly unlikely to be present).  He has not had recent PSA that I can see.  1.25.2022: He is here today to discuss his lower urinary tract symptoms and possible upcoming surveillance biopsy  Hypogonadism:  He has not been on clomiphene, but on anastrazole (semingly given due to backorder of clomiophene). He has stable libido.   10.27.2020: He continues on anastrazole 3x week. Most recent T was 522.   ED:  On sildenafil 100 mg prn--this works fine most of the time  BPH  He is  having worsening urinary symptoms.  IPSS 20, quality-of-life score 5 He is on tamsulosin, 2 capsules daily     Past Medical History:  Diagnosis Date  . Anxiety   . 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 Carotid artery occlusion    right  . Chronic pain disorder    left arm, shoulder and chest ;thoracic syndrome  . Collapsed lung 2010  . Depression   . Fibromyalgia    "left shoulder area" (10/24/2011)  . GERD (gastroesophageal reflux disease)   . History of blood transfusion 2010  . History of thoracic outlet syndrome 2008   rib removed in 2009  . Insomnia due to medical condition   . Neuromuscular disorder ([place])   . Neuropathy of hand    left  . Pneumonia 2010  . PONV (postoperative nausea and vomiting)   . Prostate cancer ([place])   . 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 RSD (reflex sympathetic dystrophy)    "left shoulder and left arm" (10/24/2011)  . Sleep apnea    could not tolerate CPAP, PCP aware  . Snoring     Past Surgical History:  Procedure Laterality Date  . ANTERIOR CERVICAL DECOMP/DISCECTOMY FUSION  10/24/2011  . ANTERIOR CERVICAL DECOMP/DISCECTOMY FUSION  10/24/2011   Procedure: ANTERIOR CERVICAL DECOMPRESSION/DISCECTOMY FUSION 2 LEVELS;  Surgeon: Melina Schools, MD;  Location: [place];  Service: Orthopedics;  Laterality: N/A;  ACDF C3-5 C-ARM SKYTRON TABLE SYNTHES VECTOR TITAN CAGE NEURO MONITORING SYTEM  . BIOPSY N/A 02/25/2017   Procedure: BIOPSY DIAPHRAGMATIC PLAQUE;  Surgeon: Grace Isaac, MD;  Location: [place];  Service: Thoracic;  Laterality: N/A;  . COLONOSCOPY WITH PROPOFOL N/A 08/01/2017   Procedure: COLONOSCOPY WITH PROPOFOL;  Surgeon: Rogene Houston, MD;  [facility];  Service: Endoscopy;  Laterality: N/A;  11:15  . CYSTECTOMY     left hand x 3  . DECORTICATION Left 02/25/2017   Procedure: DECORTICATION;  Surgeon: Grace Isaac, MD;  Location: [place];  Service: Thoracic;  Laterality: Left;  . EMPYEMA  DRAINAGE Left 02/25/2017   Procedure: LEFT EMPYEMA DRAINAGE;  Surgeon: Grace Isaac, MD;  Location: [place];  Service: Thoracic;  Laterality: Left;  . EYE SURGERY     lasix  . KNEE ARTHROSCOPY  1987   right  . POLYPECTOMY  08/01/2017   Procedure: POLYPECTOMY;  Surgeon: Rogene Houston, MD;  [facility];  Service: Endoscopy;;  colon  . REFRACTIVE SURGERY  2003   bilaterally  . RIB RESECTION  2009   "left anterior" (10/24/2011)  . VASCULAR SURGERY     to remove  blood clots  . VIDEO ASSISTED THORACOSCOPY (VATS)/EMPYEMA Left 02/25/2017   Procedure: LEFT VIDEO ASSISTED THORACOSCOPY (VATS);  Surgeon: Grace Isaac, MD;  Location: [place];  Service: Thoracic;  Laterality: Left;  Marland Kitchen VIDEO BRONCHOSCOPY N/A 02/25/2017   Procedure: VIDEO BRONCHOSCOPY;  Surgeon: Grace Isaac, MD;  [facility];  Service: Thoracic;  Laterality: N/A;    Home Medications:  Allergies as of 02/08/2020   No Known Allergies     Medication List       Accurate as of February 08, 2020  1:33 PM. If you have any questions, ask your nurse or doctor.        ALPRAZolam 0.5 MG tablet Commonly known as: XANAX Take 0.5 mg by mouth 2 (two) times daily.   anastrozole 1 MG tablet Commonly known as: ARIMIDEX Take 1 tablet (1 mg total) by mouth 3 (three) times a week. Sunday, Tuesday and Friday   aspirin EC 81 MG tablet Take 1 tablet (81 mg total) by mouth daily.   celecoxib 200 MG capsule Commonly known as: CELEBREX   cetirizine 10 MG tablet Commonly known as: ZYRTEC Take 10 mg by mouth daily.   Cinnamon 500 MG capsule Take 500 mg by mouth daily.   Coenzyme Q10 200 MG capsule Take 200 mg by mouth daily.   fentaNYL 12 MCG/HR Commonly known as: DURAGESIC Place 12 mcg onto the skin every other day.   Krill Oil 500 MG Caps Take 500 mg by mouth daily.   mirtazapine 15 MG tablet Commonly known as: REMERON Take 15 mg by mouth at bedtime.   mometasone 50 MCG/ACT nasal spray Commonly  known as: NASONEX Place 2 sprays into the nose daily as needed.   pregabalin 75 MG capsule Commonly known as: LYRICA Take 75-150 mg by mouth See admin instructions. Take 150 mg by mouth in the morning and take 75 mg by mouth at supper   PROBIOTIC DAILY PO Take 1 capsule by mouth daily.   RABEprazole 20 MG tablet Commonly known as: ACIPHEX Take 20 mg by mouth daily. PRN   saw palmetto 160 MG capsule Take 160 mg by mouth daily.   SUPER B COMPLEX/C PO Take 1 tablet by mouth daily.   tamsulosin 0.4 MG Caps capsule Commonly known as: FLOMAX Take 0.8 mg by mouth daily.   traZODone 50 MG tablet Commonly known as: DESYREL Take 50 mg by mouth at bedtime.   Turmeric 500 MG Caps Take 1 capsule  by mouth daily.   Valium 5 MG tablet Generic drug: diazepam Take 2 tablets (10 mg total) by mouth every 6 (six) hours as needed for anxiety (take 2 tablets 2 hrs before MRI).       Allergies: No Known Allergies  Family History  Problem Relation Age of Onset  . Diabetes Mother     Social History:  reports that he quit smoking about 15 years ago. His smoking use included cigarettes. He has a 30.00 pack-year smoking history. His smokeless tobacco use includes chew. He reports that he does not drink alcohol and does not use drugs.  ROS: A complete review of systems was performed.  All systems are negative except for pertinent findings as noted.  Physical Exam:  Vital signs in last 24 hours: There were no vitals taken for this visit. Constitutional:  Alert and oriented, No acute distress Cardiovascular: Regular rate  Respiratory: Normal respiratory effort Lymphatic: No lymphadenopathy Neurologic: Grossly intact, no focal deficits Psychiatric: Normal mood and affect  I have reviewed prior pt notes  I have reviewed notes from referring/previous physicians  I have reviewed urinalysis results  I have independently reviewed prior imaging (MRI)  I have reviewed prior PSA  results   Impression/Assessment:  1.  BPH with worsening symptomatology despite being on tamsulosin 0.8 mg daily  2.  Prostate cancer, very low risk disease, on active surveillance with recent MRI revealing no significant regions of interest, prostate volume 77 mL  3.  Erectile dysfunction, on sildenafil  4.  Hypogonadism, treated with oral medications  Plan:  1.  I think it worthwhile to continue on the tamsulosin, and will add tadalafil 5 mg a day which hopefully will help with BPH symptoms as well as ED.  He knows not to take sildenafil  2.  I will have him come back in about 3 months.  We will check his urinary symptomatology at that time as well as check PSA and discuss repeating a surveillance biopsy  3.  I also discussed performing TURP for his urinary symptomatology  4.  We will see if Dr. Nevada Crane has any recent PSA data.

## 2020-02-09 ENCOUNTER — Other Ambulatory Visit: Payer: Self-pay

## 2020-02-29 ENCOUNTER — Other Ambulatory Visit: Payer: Self-pay

## 2020-02-29 DIAGNOSIS — N401 Enlarged prostate with lower urinary tract symptoms: Secondary | ICD-10-CM

## 2020-02-29 MED ORDER — TADALAFIL 5 MG PO TABS: 5.0000 mg | ORAL_TABLET | ORAL | 11 refills | Status: DC | PRN

## 2020-03-01 ENCOUNTER — Encounter: Payer: Self-pay | Admitting: Urology

## 2020-03-07 ENCOUNTER — Encounter: Payer: Self-pay | Admitting: Urology

## 2020-03-09 ENCOUNTER — Other Ambulatory Visit: Payer: Self-pay

## 2020-03-09 DIAGNOSIS — N138 Other obstructive and reflux uropathy: Secondary | ICD-10-CM

## 2020-03-09 MED ORDER — TADALAFIL 10 MG PO TABS: 10.0000 mg | ORAL_TABLET | ORAL | 11 refills | Status: DC | PRN

## 2020-05-09 ENCOUNTER — Other Ambulatory Visit: Payer: Self-pay

## 2020-05-09 ENCOUNTER — Ambulatory Visit (INDEPENDENT_AMBULATORY_CARE_PROVIDER_SITE_OTHER): Payer: Commercial Managed Care - PPO | Admitting: Urology

## 2020-05-09 VITALS — BP 152/75 | HR 80 | Wt 195.0 lb

## 2020-05-09 DIAGNOSIS — R7989 Other specified abnormal findings of blood chemistry: Secondary | ICD-10-CM | POA: Diagnosis not present

## 2020-05-09 DIAGNOSIS — N138 Other obstructive and reflux uropathy: Secondary | ICD-10-CM | POA: Diagnosis not present

## 2020-05-09 DIAGNOSIS — C61 Malignant neoplasm of prostate: Secondary | ICD-10-CM

## 2020-05-09 DIAGNOSIS — N401 Enlarged prostate with lower urinary tract symptoms: Secondary | ICD-10-CM

## 2020-05-09 LAB — URINALYSIS, ROUTINE W REFLEX MICROSCOPIC
Bilirubin, UA: NEGATIVE
Glucose, UA: NEGATIVE
Ketones, UA: NEGATIVE
Leukocytes,UA: NEGATIVE
Nitrite, UA: NEGATIVE
Protein,UA: NEGATIVE
RBC, UA: NEGATIVE
Specific Gravity, UA: 1.025 (ref 1.005–1.030)
Urobilinogen, Ur: 0.2 mg/dL (ref 0.2–1.0)
pH, UA: 5.5 (ref 5.0–7.5)

## 2020-05-09 NOTE — Progress Notes (Signed)
History of Present Illness: Pt here for followup of several issues:  Prostate Cancer: .   11.29.2016: TRUS/Bx . At that time, PSA was 3.68. Prostatic volume was 60 mL, PSAD 0.06. 1/12 cores were positive-his core in the right apex revealed GS 3+3 adenocarcinoma in 10% of the core. Because of the small volume of prostate cancer, we could not get a reading from Oncotype DX.   11.2.2017: prostate MRI-revealed findings of a normal prostate without evidence of high-grade lesions. No NVI/SV invasion/capsular penetration/pelvic adenopathy/bony mets.   11.28.2017: Surveillance TRUS/Bx. At that time, PSA was 5.0, prostatic volume 63 mL, PSA density 0.08. All 12 cores were negative for cancer.   1.17.2022: MRI of prostate--No radiographic evidence of high-grade prostate carcinoma. PI-RADS 1: Very Low (clinically significant cancer is highly unlikely to be present).  Last PSA 06/2019--6.2  4.26.2022: Most recent PSA was 7.1 on 11.18.2021--done Dr. Juel Burrow office   Hypogonadism:  He has not been on clomiphene, but on anastrazole (semingly given due to backorder of clomiophene). He has stable libido.   4.26.2022: Not on testosterone repletion but is on anastrozole.  He has not wanted to come off of that.  He has not had recent testosterone level.  ED:  On tadalafil--5 mg daily  BPH  He is having worsening urinary symptoms.  IPSS 20, quality-of-life score 5 He is on tamsulosin, 2 capsules daily as well as tadalafil  Past Medical History:  Diagnosis Date  . Anxiety   . 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 Carotid artery occlusion    right  . Chronic pain disorder    left arm, shoulder and chest ;thoracic syndrome  . Collapsed lung 2010  . Depression   . Fibromyalgia    "left shoulder area" (10/24/2011)  . GERD (gastroesophageal reflux disease)   . History of blood transfusion 2010  . History of thoracic outlet syndrome 2008   rib removed in 2009  . Insomnia due to medical  condition   . Neuromuscular disorder ([place])   . Neuropathy of hand    left  . Pneumonia 2010  . PONV (postoperative nausea and vomiting)   . Prostate cancer ([place])   . 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 RSD (reflex sympathetic dystrophy)    "left shoulder and left arm" (10/24/2011)  . Sleep apnea    could not tolerate CPAP, PCP aware  . Snoring     Past Surgical History:  Procedure Laterality Date  . ANTERIOR CERVICAL DECOMP/DISCECTOMY FUSION  10/24/2011  . ANTERIOR CERVICAL DECOMP/DISCECTOMY FUSION  10/24/2011   Procedure: ANTERIOR CERVICAL DECOMPRESSION/DISCECTOMY FUSION 2 LEVELS;  Surgeon: Melina Schools, MD;  Location: [place];  Service: Orthopedics;  Laterality: N/A;  ACDF C3-5 C-ARM SKYTRON TABLE SYNTHES VECTOR TITAN CAGE NEURO MONITORING SYTEM  . BIOPSY N/A 02/25/2017   Procedure: BIOPSY DIAPHRAGMATIC PLAQUE;  Surgeon: Grace Isaac, MD;  Location: [place];  Service: Thoracic;  Laterality: N/A;  . COLONOSCOPY WITH PROPOFOL N/A 08/01/2017   Procedure: COLONOSCOPY WITH PROPOFOL;  Surgeon: Rogene Houston, MD;  [facility];  Service: Endoscopy;  Laterality: N/A;  11:15  . CYSTECTOMY     left hand x 3  . DECORTICATION Left 02/25/2017   Procedure: DECORTICATION;  Surgeon: Grace Isaac, MD;  Location: [place];  Service: Thoracic;  Laterality: Left;  . EMPYEMA DRAINAGE Left 02/25/2017   Procedure: LEFT EMPYEMA DRAINAGE;  Surgeon: Grace Isaac, MD;  Location: [place];  Service: Thoracic;  Laterality: Left;  . EYE SURGERY     lasix  . KNEE ARTHROSCOPY  1987   right  . POLYPECTOMY  08/01/2017   Procedure: POLYPECTOMY;  Surgeon: Rogene Houston, MD;  [facility];  Service: Endoscopy;;  colon  . REFRACTIVE SURGERY  2003   bilaterally  . RIB RESECTION  2009   "left anterior" (10/24/2011)  . VASCULAR SURGERY     to remove  blood clots  . VIDEO ASSISTED THORACOSCOPY (VATS)/EMPYEMA Left 02/25/2017   Procedure: LEFT VIDEO ASSISTED  THORACOSCOPY (VATS);  Surgeon: Grace Isaac, MD;  Location: [place];  Service: Thoracic;  Laterality: Left;  Marland Kitchen VIDEO BRONCHOSCOPY N/A 02/25/2017   Procedure: VIDEO BRONCHOSCOPY;  Surgeon: Grace Isaac, MD;  Location: [facility];  Service: Thoracic;  Laterality: N/A;    Home Medications:  Allergies as of 05/09/2020   No Known Allergies     Medication List       Accurate as of May 09, 2020  8:23 AM. If you have any questions, ask your nurse or doctor.        ALPRAZolam 0.5 MG tablet Commonly known as: XANAX Take 0.5 mg by mouth 2 (two) times daily.   anastrozole 1 MG tablet Commonly known as: ARIMIDEX Take 1 tablet (1 mg total) by mouth 3 (three) times a week. Sunday, Tuesday and Friday   aspirin EC 81 MG tablet Take 1 tablet (81 mg total) by mouth daily.   celecoxib 200 MG capsule Commonly known as: CELEBREX   cetirizine 10 MG tablet Commonly known as: ZYRTEC Take 10 mg by mouth daily.   Cinnamon 500 MG capsule Take 500 mg by mouth daily.   Coenzyme Q10 200 MG capsule Take 200 mg by mouth daily.   fentaNYL 12 MCG/HR Commonly known as: DURAGESIC Place 12 mcg onto the skin every other day.   Krill Oil 500 MG Caps Take 500 mg by mouth daily.   mirtazapine 15 MG tablet Commonly known as: REMERON Take 15 mg by mouth at bedtime.   mometasone 50 MCG/ACT nasal spray Commonly known as: NASONEX Place 2 sprays into the nose daily as needed.   pregabalin 75 MG capsule Commonly known as: LYRICA Take 75-150 mg by mouth See admin instructions. Take 150 mg by mouth in the morning and take 75 mg by mouth at supper   PROBIOTIC DAILY PO Take 1 capsule by mouth daily.   RABEprazole 20 MG tablet Commonly known as: ACIPHEX Take 20 mg by mouth daily. PRN   saw palmetto 160 MG capsule Take 160 mg by mouth daily.   SUPER B COMPLEX/C PO Take 1 tablet by mouth daily.   tadalafil 10 MG tablet Commonly known as: CIALIS Take 1 tablet (10 mg total) by mouth as needed  for erectile dysfunction.   tamsulosin 0.4 MG Caps capsule Commonly known as: FLOMAX Take 0.8 mg by mouth daily.   traZODone 50 MG tablet Commonly known as: DESYREL Take 50 mg by mouth at bedtime.   Turmeric 500 MG Caps Take 1 capsule by mouth daily.   Valium 5 MG tablet Generic drug: diazepam Take 2 tablets (10 mg total) by mouth every 6 (six) hours as needed for anxiety (take 2 tablets 2 hrs before MRI).       Allergies: No Known Allergies  Family History  Problem Relation Age of Onset  . Diabetes Mother     Social History:  reports that he quit smoking about 15 years  ago. His smoking use included cigarettes. He has a 30.00 pack-year smoking history. His smokeless tobacco use includes chew. He reports that he does not drink alcohol and does not use drugs.  ROS: A complete review of systems was performed.  All systems are negative except for pertinent findings as noted.  Physical Exam:  Vital signs in last 24 hours: There were no vitals taken for this visit. Constitutional:  Alert and oriented, No acute distress Cardiovascular: Regular rate  Respiratory: Normal respiratory effort GI: Abdomen is soft, nontender, nondistended, no abdominal masses. No CVAT.  Genitourinary: Normal male phallus, testes are descended bilaterally and non-tender and without masses, scrotum is normal in appearance without lesions or masses, perineum is normal on inspection.  Prostate 50 mL in size. Lymphatic: No lymphadenopathy Neurologic: Grossly intact, no focal deficits Psychiatric: Normal mood and affect  I have reviewed prior pt notes  I have reviewed notes from referring/previous physicians  I have reviewed urinalysis results  I have independently reviewed prior imaging  I have reviewed prior PSA results   Impression/Assessment:  1.  History of low risk prostate cancer, on active surveillance.  Last surveillance biopsy was 4/2 years ago.  He has been reticent to proceed with  repeat which will need to be done this year.  Most recent MRI revealed no suspicious lesions.  2.  BPH, symptoms doing fine on combination tamsulosin and Cialis  3.  Hypogonadism, he is only on anastrozole, needs testosterone level checked and I would recommend him going off of it for a while  Plan:  1.  PSA and testosterone levels checked today  2.,  Come off of anastrozole for now, I will have him drop in in 2 weeks for repeat testosterone levels  3.  We will schedule him to have ultrasound and biopsy in 6 months

## 2020-05-09 NOTE — Progress Notes (Signed)
Urological Symptom Review  Patient is experiencing the following symptoms: Frequent urination Hard to postpone urination Get up at night to urinate Erection problems (male only)   Review of Systems  Gastrointestinal (upper)  : Indigestion/heartburn  Gastrointestinal (lower) : Negative for lower GI symptoms  Constitutional : Negative for symptoms  Skin: Negative for skin symptoms  Eyes: Negative for eye symptoms  Ear/Nose/Throat : Sinus problems  Hematologic/Lymphatic: Negative for Hematologic/Lymphatic symptoms  Cardiovascular : Negative for cardiovascular symptoms  Respiratory : Negative for respiratory symptoms  Endocrine: Negative for endocrine symptoms  Musculoskeletal: Joint pain  Neurological: Negative for neurological symptoms  Psychologic: Negative for psychiatric symptoms

## 2020-05-10 LAB — PSA: Prostate Specific Ag, Serum: 8.2 ng/mL — ABNORMAL HIGH (ref 0.0–4.0)

## 2020-05-10 LAB — TESTOSTERONE: Testosterone: 394 ng/dL (ref 264–916)

## 2020-05-11 NOTE — Progress Notes (Signed)
Letter with recommendations sent via my chart.

## 2020-05-23 ENCOUNTER — Other Ambulatory Visit: Payer: Self-pay

## 2020-05-23 ENCOUNTER — Other Ambulatory Visit: Payer: Medicare Other

## 2020-05-23 DIAGNOSIS — C61 Malignant neoplasm of prostate: Secondary | ICD-10-CM

## 2020-05-24 LAB — BUN+CREAT
BUN/Creatinine Ratio: 20 (ref 10–24)
BUN: 22 mg/dL (ref 8–27)
Creatinine, Ser: 1.12 mg/dL (ref 0.76–1.27)
eGFR: 73 mL/min/{1.73_m2} (ref >59)

## 2020-05-24 LAB — PSA: Prostate Specific Ag, Serum: 8.3 ng/mL — ABNORMAL HIGH (ref 0.0–4.0)

## 2020-05-30 ENCOUNTER — Other Ambulatory Visit: Payer: Self-pay

## 2020-05-30 DIAGNOSIS — R7989 Other specified abnormal findings of blood chemistry: Secondary | ICD-10-CM

## 2020-05-30 NOTE — Progress Notes (Signed)
Sent via mychart

## 2020-06-02 ENCOUNTER — Other Ambulatory Visit: Payer: Commercial Managed Care - PPO

## 2020-08-13 DIAGNOSIS — M25512 Pain in left shoulder: Secondary | ICD-10-CM | POA: Diagnosis not present

## 2020-08-13 DIAGNOSIS — M542 Cervicalgia: Secondary | ICD-10-CM | POA: Diagnosis not present

## 2020-08-15 DIAGNOSIS — M542 Cervicalgia: Secondary | ICD-10-CM | POA: Diagnosis not present

## 2020-08-22 ENCOUNTER — Other Ambulatory Visit: Payer: Self-pay | Admitting: Orthopedic Surgery

## 2020-08-22 DIAGNOSIS — M542 Cervicalgia: Secondary | ICD-10-CM

## 2020-09-06 ENCOUNTER — Ambulatory Visit
Admission: RE | Admit: 2020-09-06 | Discharge: 2020-09-06 | Disposition: A | Discharge status: Home / Self Care | Payer: Commercial Managed Care - PPO | Source: Ambulatory Visit | Attending: Orthopedic Surgery | Admitting: Orthopedic Surgery

## 2020-09-06 ENCOUNTER — Other Ambulatory Visit: Payer: Self-pay

## 2020-09-06 DIAGNOSIS — M4802 Spinal stenosis, cervical region: Secondary | ICD-10-CM | POA: Diagnosis not present

## 2020-09-06 DIAGNOSIS — M542 Cervicalgia: Secondary | ICD-10-CM

## 2020-09-14 DIAGNOSIS — G894 Chronic pain syndrome: Secondary | ICD-10-CM | POA: Diagnosis not present

## 2020-09-14 DIAGNOSIS — R7301 Impaired fasting glucose: Secondary | ICD-10-CM | POA: Diagnosis not present

## 2020-09-14 DIAGNOSIS — M542 Cervicalgia: Secondary | ICD-10-CM | POA: Diagnosis not present

## 2020-10-05 ENCOUNTER — Encounter: Payer: Self-pay | Admitting: Urology

## 2020-11-01 ENCOUNTER — Other Ambulatory Visit (INDEPENDENT_AMBULATORY_CARE_PROVIDER_SITE_OTHER): Payer: Self-pay | Admitting: Nurse Practitioner

## 2020-11-01 DIAGNOSIS — I779 Disorder of arteries and arterioles, unspecified: Secondary | ICD-10-CM

## 2020-11-02 ENCOUNTER — Other Ambulatory Visit: Payer: Self-pay

## 2020-11-02 ENCOUNTER — Encounter (INDEPENDENT_AMBULATORY_CARE_PROVIDER_SITE_OTHER): Payer: Self-pay | Admitting: Nurse Practitioner

## 2020-11-02 ENCOUNTER — Ambulatory Visit (INDEPENDENT_AMBULATORY_CARE_PROVIDER_SITE_OTHER): Payer: Medicare Other | Admitting: Nurse Practitioner

## 2020-11-02 ENCOUNTER — Ambulatory Visit (INDEPENDENT_AMBULATORY_CARE_PROVIDER_SITE_OTHER): Payer: Medicare Other

## 2020-11-02 VITALS — BP 143/70 | HR 71 | Resp 17 | Ht 74.0 in | Wt 208.0 lb

## 2020-11-02 DIAGNOSIS — I6523 Occlusion and stenosis of bilateral carotid arteries: Secondary | ICD-10-CM

## 2020-11-02 DIAGNOSIS — I779 Disorder of arteries and arterioles, unspecified: Secondary | ICD-10-CM | POA: Diagnosis not present

## 2020-11-07 ENCOUNTER — Encounter (INDEPENDENT_AMBULATORY_CARE_PROVIDER_SITE_OTHER): Payer: Self-pay | Admitting: Nurse Practitioner

## 2020-11-07 NOTE — Progress Notes (Signed)
Subjective:    Patient ID: Kenneth [name], male    DOB: 27-Feb-1955, 65 y.o.   MRN: [id number] Chief Complaint  Patient presents with   Follow-up    Carotid ultrasound    Kenneth [name] is a 65 year old male is seen for follow up evaluation of carotid stenosis. The carotid stenosis followed by ultrasound.    The patient denies amaurosis fugax. There is no recent history of TIA symptoms or focal motor deficits. There is no prior documented CVA.   The patient is taking enteric-coated aspirin 81 mg daily.   There is no history of migraine headaches. There is no history of seizures.   The patient has a history of coronary artery disease, no recent episodes of angina or shortness of breath. The patient denies PAD or claudication symptoms. There is a history of hyperlipidemia which is being treated with a statin.     Duplex ultrasound shows right ICA occlusion which is known with left being 40-59% stenosis.  Velocities are only slightly increased from previous studies on 11/05/2019.     Review of Systems  Eyes:  Negative for visual disturbance.  All other systems reviewed and are negative.     Objective:   Physical Exam Vitals reviewed.  HENT:     Head: Normocephalic.  Neck:     Vascular: Carotid bruit present.  Cardiovascular:     Rate and Rhythm: Normal rate.     Pulses: Normal pulses.  Pulmonary:     Effort: Pulmonary effort is normal.  Skin:    General: Skin is warm and dry.  Neurological:     Mental Status: He is alert and oriented to person, place, and time.  Psychiatric:        Mood and Affect: Mood normal.        Behavior: Behavior normal.        Thought Content: Thought content normal.        Judgment: Judgment normal.    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   Carotid artery occlusion     right   Chronic pain disorder    left arm, shoulder and chest ;thoracic syndrome   Collapsed lung 2010   Depression    Fibromyalgia    "left shoulder area" (10/24/2011)   GERD (gastroesophageal reflux disease)    History of blood transfusion 2010   History of thoracic outlet syndrome 2008   rib removed in 2009   Insomnia due to medical condition    Neuromuscular disorder ([place])    Neuropathy of hand    left   Pneumonia 2010   PONV (postoperative nausea and vomiting)    Prostate cancer ([place])    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   RSD (reflex sympathetic dystrophy)    "left shoulder and left arm" (10/24/2011)   Sleep apnea    could not tolerate CPAP, PCP aware   Snoring     Social History   Socioeconomic History   Marital status: Married    Spouse name: Not on file   Number of children: 2   Years of education: College   Highest education level: Not on file  Occupational History   Occupation:  Disabled  Tobacco Use   Smoking status: Former    Packs/day: 1.00    Years: 30.00    Pack years: 30.00    Types: Cigarettes    Quit date: 10/20/2004    Years since quitting: 16.0   Smokeless tobacco: Current    Types: Chew  Vaping Use   Vaping Use: Never used  Substance and Sexual Activity   Alcohol use: No    Alcohol/week: 0.0 standard drinks   Drug use: No   Sexual activity: Yes  Other Topics Concern   Not on file  Social History Narrative   Drinks 2 sodas a day   Social Determinants of Health   Financial Resource Strain: Not on file  Food Insecurity: Not on file  Transportation Needs: Not on file  Physical Activity: Not on file  Stress: Not on file  Social Connections: Not on file  Intimate Partner Violence: Not on file    Past Surgical History:  Procedure Laterality Date   ANTERIOR CERVICAL DECOMP/DISCECTOMY FUSION  10/24/2011   ANTERIOR CERVICAL DECOMP/DISCECTOMY FUSION  10/24/2011   Procedure: ANTERIOR CERVICAL DECOMPRESSION/DISCECTOMY  FUSION 2 LEVELS;  Surgeon: Melina Schools, MD;  Location: [place];  Service: Orthopedics;  Laterality: N/A;  ACDF C3-5 C-ARM SKYTRON TABLE SYNTHES VECTOR TITAN CAGE NEURO MONITORING SYTEM   BIOPSY N/A 02/25/2017   Procedure: BIOPSY DIAPHRAGMATIC PLAQUE;  Surgeon: Grace Isaac, MD;  Location: [place];  Service: Thoracic;  Laterality: N/A;   COLONOSCOPY WITH PROPOFOL N/A 08/01/2017   Procedure: COLONOSCOPY WITH PROPOFOL;  Surgeon: Rogene Houston, MD;  [facility];  Service: Endoscopy;  Laterality: N/A;  11:15   CYSTECTOMY     left hand x 3   DECORTICATION Left 02/25/2017   Procedure: DECORTICATION;  Surgeon: Grace Isaac, MD;  Location: [place];  Service: Thoracic;  Laterality: Left;   EMPYEMA DRAINAGE Left 02/25/2017   Procedure: LEFT EMPYEMA DRAINAGE;  Surgeon: Grace Isaac, MD;  Location: [employer];  Service: Thoracic;  Laterality: Left;   EYE SURGERY     lasix   KNEE ARTHROSCOPY  1987   right   POLYPECTOMY  08/01/2017   Procedure: POLYPECTOMY;  Surgeon: Rogene Houston, MD;  [facility];  Service: Endoscopy;;  colon   REFRACTIVE SURGERY  2003   bilaterally   RIB RESECTION  2009   "left anterior" (10/24/2011)   VASCULAR SURGERY     to remove  blood clots   VIDEO ASSISTED THORACOSCOPY (VATS)/EMPYEMA Left 02/25/2017   Procedure: LEFT VIDEO ASSISTED THORACOSCOPY (VATS);  Surgeon: Grace Isaac, MD;  Location: [place];  Service: Thoracic;  Laterality: Left;   VIDEO BRONCHOSCOPY N/A 02/25/2017   Procedure: VIDEO BRONCHOSCOPY;  Surgeon: Grace Isaac, MD;  Location: [facility];  Service: Thoracic;  Laterality: N/A;    Family History  Problem Relation Age of Onset   Diabetes Mother     No Known Allergies  CBC Latest Ref Rng & Units 07/28/2017 02/28/2017 02/27/2017  WBC 4.0 - 10.5 K/uL 7.8 10.5 9.8  Hemoglobin 13.0 - 17.0 g/dL 13.8 10.8(L) 9.7(L)  Hematocrit 39.0 - 52.0 % 39.9 32.7(L) 29.3(L)  Platelets 150 - 400 K/uL 109(L) 210 186      CMP      Component Value Date/Time   NA 142 07/28/2017 0913   K 4.3 07/28/2017 0913   CL 107 07/28/2017 0913   CO2 25 07/28/2017 0913   GLUCOSE 118 (H) 07/28/2017 0913   BUN 22 05/23/2020 0955  CREATININE 1.12 05/23/2020 0955   CALCIUM 8.8 (L) 07/28/2017 0913   PROT 5.5 (L) 02/27/2017 0524   ALBUMIN 2.1 (L) 02/27/2017 0524   AST 22 02/27/2017 0524   ALT 21 02/27/2017 0524   ALKPHOS 77 02/27/2017 0524   BILITOT 0.4 02/27/2017 0524   GFRNONAA >60 07/28/2017 0913   GFRAA >60 07/28/2017 0913     No results found.     Assessment & Plan:   1. Bilateral carotid artery stenosis Recommend:   Given the patient's asymptomatic subcritical stenosis no further invasive testing or surgery at this time.   Duplex ultrasound shows right ICA occlusion which is known with left being 40-59& stenosis.   Continue antiplatelet therapy as prescribed Continue management of CAD, HTN and Hyperlipidemia Healthy heart diet,  encouraged exercise at least 4 times per week Follow up in 12 months with duplex ultrasound and physical exam     Current Outpatient Medications on File Prior to Visit  Medication Sig Dispense Refill   ALPRAZolam (XANAX) 0.5 MG tablet Take 0.5 mg by mouth 2 (two) times daily.   5   aspirin EC 81 MG tablet Take 1 tablet (81 mg total) by mouth daily.     cetirizine (ZYRTEC) 10 MG tablet Take 10 mg by mouth daily.     Cinnamon 500 MG capsule Take 500 mg by mouth daily.      Coenzyme Q10 200 MG capsule Take 200 mg by mouth daily.     cyclobenzaprine (FLEXERIL) 5 MG tablet cyclobenzaprine 5 mg tablet  TAKE 1 TABLET BY MOUTH THREE TIMES A DAY AS NEEDED     fentaNYL (DURAGESIC - DOSED MCG/HR) 12 MCG/HR Place 12 mcg onto the skin every other day.      Krill Oil 500 MG CAPS Take 500 mg by mouth daily.      mirtazapine (REMERON) 15 MG tablet Take 15 mg by mouth at bedtime.     mometasone (NASONEX) 50 MCG/ACT nasal spray Place 2 sprays into the nose daily as needed.  12   pregabalin (LYRICA)  75 MG capsule Take 75-150 mg by mouth See admin instructions. Take 150 mg by mouth in the morning and take 75 mg by mouth at supper     Probiotic Product (PROBIOTIC DAILY PO) Take 1 capsule by mouth daily.      SUPER B COMPLEX/C PO Take 1 tablet by mouth daily.     tadalafil (CIALIS) 5 MG tablet Take 5 mg by mouth as needed.     tamsulosin (FLOMAX) 0.4 MG CAPS capsule Take 0.8 mg by mouth daily.   11   traZODone (DESYREL) 50 MG tablet Take 50 mg by mouth at bedtime.   1   Turmeric 500 MG CAPS Take 1 capsule by mouth daily.     anastrozole (ARIMIDEX) 1 MG tablet Take 1 tablet (1 mg total) by mouth 3 (three) times a week. Sunday, Tuesday and Friday (Patient not taking: Reported on 11/02/2020) 30 tablet 5   celecoxib (CELEBREX) 200 MG capsule  (Patient not taking: Reported on 11/02/2020)     RABEprazole (ACIPHEX) 20 MG tablet Take 20 mg by mouth daily. PRN (Patient not taking: Reported on 11/02/2020)     saw palmetto 160 MG capsule Take 160 mg by mouth daily. (Patient not taking: Reported on 11/02/2020)     tadalafil (CIALIS) 10 MG tablet Take 1 tablet (10 mg total) by mouth as needed for erectile dysfunction. (Patient not taking: Reported on 11/02/2020) 30 tablet 11  VALIUM 5 MG tablet Take 2 tablets (10 mg total) by mouth every 6 (six) hours as needed for anxiety (take 2 tablets 2 hrs before MRI). (Patient not taking: Reported on 11/02/2020) 2 tablet 0   No current facility-administered medications on file prior to visit.    There are no Patient Instructions on file for this visit. No follow-ups on file.   Kris Hartmann, NP

## 2020-12-08 DIAGNOSIS — J069 Acute upper respiratory infection, unspecified: Secondary | ICD-10-CM | POA: Diagnosis not present

## 2020-12-08 DIAGNOSIS — U071 COVID-19: Secondary | ICD-10-CM | POA: Diagnosis not present

## 2020-12-15 DIAGNOSIS — U071 COVID-19: Secondary | ICD-10-CM | POA: Diagnosis not present

## 2020-12-15 DIAGNOSIS — R059 Cough, unspecified: Secondary | ICD-10-CM | POA: Diagnosis not present

## 2021-01-01 DIAGNOSIS — R7301 Impaired fasting glucose: Secondary | ICD-10-CM | POA: Diagnosis not present

## 2021-01-03 DIAGNOSIS — G4700 Insomnia, unspecified: Secondary | ICD-10-CM | POA: Diagnosis not present

## 2021-01-03 DIAGNOSIS — D6949 Other primary thrombocytopenia: Secondary | ICD-10-CM | POA: Diagnosis not present

## 2021-01-03 DIAGNOSIS — G540 Brachial plexus disorders: Secondary | ICD-10-CM | POA: Diagnosis not present

## 2021-01-03 DIAGNOSIS — R7301 Impaired fasting glucose: Secondary | ICD-10-CM | POA: Diagnosis not present

## 2021-01-03 DIAGNOSIS — E782 Mixed hyperlipidemia: Secondary | ICD-10-CM | POA: Diagnosis not present

## 2021-01-03 DIAGNOSIS — G894 Chronic pain syndrome: Secondary | ICD-10-CM | POA: Diagnosis not present

## 2021-01-03 DIAGNOSIS — M542 Cervicalgia: Secondary | ICD-10-CM | POA: Diagnosis not present

## 2021-01-03 DIAGNOSIS — R944 Abnormal results of kidney function studies: Secondary | ICD-10-CM | POA: Diagnosis not present

## 2021-01-03 DIAGNOSIS — I6529 Occlusion and stenosis of unspecified carotid artery: Secondary | ICD-10-CM | POA: Diagnosis not present

## 2021-01-25 ENCOUNTER — Encounter: Payer: Self-pay | Admitting: Urology

## 2021-01-29 ENCOUNTER — Telehealth: Payer: Self-pay

## 2021-01-29 DIAGNOSIS — C61 Malignant neoplasm of prostate: Secondary | ICD-10-CM

## 2021-01-29 MED ORDER — LEVOFLOXACIN 750 MG PO TABS: 750.0000 mg | ORAL_TABLET | Freq: Once | ORAL | 0 refills | Status: DC

## 2021-01-31 ENCOUNTER — Other Ambulatory Visit: Payer: Self-pay

## 2021-01-31 MED ORDER — LEVOFLOXACIN 750 MG PO TABS
750.0000 mg | ORAL_TABLET | Freq: Every day | ORAL | 0 refills | Status: AC
Start: 2021-01-31 — End: ?

## 2021-02-05 ENCOUNTER — Other Ambulatory Visit: Payer: Self-pay | Admitting: Urology

## 2021-02-09 NOTE — Telephone Encounter (Signed)
Message left to call office back concerning Biopsy date and instructions.

## 2021-02-12 NOTE — Telephone Encounter (Signed)
Spoke with patient's wife. Patient is unavailable at this time but will be available in the morning before 10 a.m. Will call patient in the morning at requested time.

## 2021-02-12 NOTE — Telephone Encounter (Signed)
Spoke with patient and went over biopsy instructions. Patient voiced understanding.

## 2021-02-12 NOTE — Telephone Encounter (Signed)
Patient left a voicemail:  needing have questions answered regarding upcoming biopsy.  Please advise.  Thanks, Helene Kelp

## 2021-02-20 ENCOUNTER — Other Ambulatory Visit: Payer: Self-pay | Admitting: Urology

## 2021-02-20 ENCOUNTER — Ambulatory Visit (HOSPITAL_COMMUNITY)
Admission: RE | Admit: 2021-02-20 | Discharge: 2021-02-20 | Disposition: A | Discharge status: Home / Self Care | Payer: Medicare Other | Source: Ambulatory Visit | Attending: Urology | Admitting: Urology

## 2021-02-20 ENCOUNTER — Encounter (HOSPITAL_COMMUNITY): Payer: Self-pay

## 2021-02-20 ENCOUNTER — Ambulatory Visit (HOSPITAL_BASED_OUTPATIENT_CLINIC_OR_DEPARTMENT_OTHER): Payer: Medicare Other | Admitting: Urology

## 2021-02-20 ENCOUNTER — Other Ambulatory Visit: Payer: Self-pay

## 2021-02-20 DIAGNOSIS — N138 Other obstructive and reflux uropathy: Secondary | ICD-10-CM

## 2021-02-20 DIAGNOSIS — C61 Malignant neoplasm of prostate: Secondary | ICD-10-CM | POA: Diagnosis present

## 2021-02-20 MED ORDER — LIDOCAINE HCL (PF) 2 % IJ SOLN
INTRAMUSCULAR | Status: AC
  Administered 2021-02-20: 10 mL
  Filled 2021-02-20: qty 10

## 2021-02-20 MED ORDER — GENTAMICIN SULFATE 40 MG/ML IJ SOLN: 160.0000 mg | Freq: Once | INTRAMUSCULAR | Status: AC

## 2021-02-20 MED ORDER — GENTAMICIN SULFATE 40 MG/ML IJ SOLN
INTRAMUSCULAR | Status: AC
  Administered 2021-02-20: 160 mg via INTRAMUSCULAR
  Filled 2021-02-20: qty 4

## 2021-02-20 MED ORDER — LIDOCAINE HCL (PF) 2 % IJ SOLN: 10.0000 mL | Freq: Once | INTRAMUSCULAR | Status: AC

## 2021-02-20 NOTE — Progress Notes (Signed)
PT tolerated prostate biopsy procedure and antibiotic injections well today. Labs obtained and sent for pathology. PT ambulatory at discharge with no acute distress noted and verbalized understanding of discharge instructions.  

## 2021-02-20 NOTE — Progress Notes (Signed)
This man is here today for repeat ultrasound and biopsy of his prostate.  This is for surveillance of low risk prostate cancer.  11.29.2016: TRUS/Bx . At that time, PSA was 3.68. Prostatic volume was 60 mL, PSAD 0.06. 1/12 cores were positive-his core in the right apex revealed GS 3+3 adenocarcinoma in 10% of the core. Because of the small volume of prostate cancer, we could not get a reading from Oncotype DX.    11.2.2017: prostate MRI-revealed findings of a normal prostate without evidence of high-grade lesions. No NVI/SV invasion/capsular penetration/pelvic adenopathy/bony mets.    11.28.2017: Surveillance TRUS/Bx. At that time, PSA was 5.0, prostatic volume 63 mL, PSA density 0.08. All 12 cores were negative for cancer.    1.17.2022: MRI of prostate--No radiographic evidence of high-grade prostate carcinoma. PI-RADS 1: Very Low (clinically significant cancer is highly unlikely to be present).  Last PSA 06/2019--6.2    Risks, benefits, and some of the potential complications of a transrectal ultrasounds of the prostate (TRUSP) with biopsies were discussed at length with the patient including gross hematuria, blood in the bowel movements, hematospermia, bacteremia, infection, voiding discomfort, urinary retention, fever, chills, sepsis, blood transfusion, death, and others. All questions were answered. Informed consent was obtained. The patient confirmed that he had taken his pre-procedure antibiotic. All anticoagulants were discontinued prior to the procedure. The patient emptied his bladder. He was positioned in a comfortable left lateral decubitus position with hips and knees acutely flexed.  The rectal probe was inserted into the rectum without difficulty. 10cc of 2% Lidocaine without epinephrine was instilled with a spinal needle using ultrasound guidance near the junction of each seminal vesicle and the prostate.  Sequential transverse (axial) scans were made in small increments beginning at  the seminal vesicles and ending at the prostatic apex. Sequential longitudinal (saggital) scans were made in small increments beginning at the right lateral prostate and ending at the left lateral prostate. Excellent anatomical imaging was obtained. The peripheral, transitional, and central zones were well-defined. The seminal vesicles were normal.  Prostate volume 110 ml.  There were no hypoechoic areas. 12 needle core biopsies were performed. 1 biopsy each was taken from the following areas:  Right lateral base, right medial base, right lateral mid prostate, right medial mid prostate, right lateral apical prostate, right medial apical prostate, left lateral base, left medial base, left lateral mid prostate, left medial mid prostate, left lateral apical prostate, left medial apical prostate.. Minimal prostatic calcifications were noted. Excellent biopsy specimens were obtained.  Follow-up rectal examination was unremarkable. The procedure was well-tolerated and without complications. Antibiotic instructions were given. The patient was told that:  For several days:  he should increase his fluid intake and limit strenuous activity  he might have mild discomfort at the base of his penis or in his rectum  he might have blood in his urine or blood in his bowel movements  For 2-3 months:  he might have blood in his ejaculate (semen)  Instructions were given to call the office immedicately for blood clots in the urine or bowel movements, difficulty urinating, inability to urinate, urinary retention, painful or frequent urination, fever, chills, nausea, vomiting, or other illness. The patient stated that he understood these instructions and would comply with them. We told the patient that prostate biopsy pathology reports are usually available within 3-5 working days, unless a pathologic second opinion is required, which may take 7-14 days. We told him to contact us to check on the status of his  biopsy if  he has not heard from [place] within 7 days. The patient left the ultrasound examination room in stable condition.

## 2021-02-23 ENCOUNTER — Telehealth: Payer: Self-pay

## 2021-02-23 NOTE — Telephone Encounter (Signed)
Wife called regarding patient not feeling well after his biopsy. Symptoms are lethargy, blood when peeing, chills and no energy. Per Dr. Alyson Ingles pt needs to be seen at ER. Wife was understanding but was unsure if they were going to go.

## 2021-04-03 ENCOUNTER — Encounter: Payer: Self-pay | Admitting: Urology

## 2021-04-10 DIAGNOSIS — G894 Chronic pain syndrome: Secondary | ICD-10-CM | POA: Diagnosis not present

## 2021-04-10 DIAGNOSIS — E782 Mixed hyperlipidemia: Secondary | ICD-10-CM | POA: Diagnosis not present

## 2021-04-10 DIAGNOSIS — G4700 Insomnia, unspecified: Secondary | ICD-10-CM | POA: Diagnosis not present

## 2021-04-10 DIAGNOSIS — R7301 Impaired fasting glucose: Secondary | ICD-10-CM | POA: Diagnosis not present

## 2021-04-10 DIAGNOSIS — K5903 Drug induced constipation: Secondary | ICD-10-CM | POA: Diagnosis not present

## 2021-04-10 DIAGNOSIS — G540 Brachial plexus disorders: Secondary | ICD-10-CM | POA: Diagnosis not present

## 2021-04-10 DIAGNOSIS — I6529 Occlusion and stenosis of unspecified carotid artery: Secondary | ICD-10-CM | POA: Diagnosis not present

## 2021-07-05 DIAGNOSIS — R7301 Impaired fasting glucose: Secondary | ICD-10-CM | POA: Diagnosis not present

## 2021-07-05 DIAGNOSIS — E782 Mixed hyperlipidemia: Secondary | ICD-10-CM | POA: Diagnosis not present

## 2021-07-12 DIAGNOSIS — D6949 Other primary thrombocytopenia: Secondary | ICD-10-CM | POA: Diagnosis not present

## 2021-07-12 DIAGNOSIS — G4700 Insomnia, unspecified: Secondary | ICD-10-CM | POA: Diagnosis not present

## 2021-07-12 DIAGNOSIS — I6529 Occlusion and stenosis of unspecified carotid artery: Secondary | ICD-10-CM | POA: Diagnosis not present

## 2021-07-12 DIAGNOSIS — R7301 Impaired fasting glucose: Secondary | ICD-10-CM | POA: Diagnosis not present

## 2021-07-12 DIAGNOSIS — G540 Brachial plexus disorders: Secondary | ICD-10-CM | POA: Diagnosis not present

## 2021-07-12 DIAGNOSIS — M542 Cervicalgia: Secondary | ICD-10-CM | POA: Diagnosis not present

## 2021-07-12 DIAGNOSIS — N1831 Chronic kidney disease, stage 3a: Secondary | ICD-10-CM | POA: Diagnosis not present

## 2021-07-12 DIAGNOSIS — E782 Mixed hyperlipidemia: Secondary | ICD-10-CM | POA: Diagnosis not present

## 2021-07-12 DIAGNOSIS — G894 Chronic pain syndrome: Secondary | ICD-10-CM | POA: Diagnosis not present

## 2021-07-13 ENCOUNTER — Other Ambulatory Visit: Payer: Self-pay

## 2021-08-13 ENCOUNTER — Other Ambulatory Visit: Payer: Medicare Other

## 2021-08-21 ENCOUNTER — Ambulatory Visit: Payer: Medicare Other | Admitting: Urology

## 2021-08-22 ENCOUNTER — Other Ambulatory Visit: Payer: Medicare Other

## 2021-08-28 ENCOUNTER — Encounter: Payer: Self-pay | Admitting: Urology

## 2021-08-28 ENCOUNTER — Ambulatory Visit: Payer: Medicare Other | Admitting: Urology

## 2021-08-28 VITALS — BP 118/65 | HR 91 | Ht 74.0 in | Wt 199.0 lb

## 2021-08-28 DIAGNOSIS — N401 Enlarged prostate with lower urinary tract symptoms: Secondary | ICD-10-CM | POA: Diagnosis not present

## 2021-08-28 DIAGNOSIS — N138 Other obstructive and reflux uropathy: Secondary | ICD-10-CM

## 2021-08-28 DIAGNOSIS — E291 Testicular hypofunction: Secondary | ICD-10-CM

## 2021-08-28 DIAGNOSIS — C61 Malignant neoplasm of prostate: Secondary | ICD-10-CM

## 2021-08-28 DIAGNOSIS — R7989 Other specified abnormal findings of blood chemistry: Secondary | ICD-10-CM

## 2021-08-28 NOTE — Progress Notes (Signed)
HPI: Here for followup of several issues:  PCA  11.29.2016: TRUS/Bx . At that time, PSA was 3.68. Prostatic volume was 60 mL, PSAD 0.06. 1/12 cores were positive-his core in the right apex revealed GS 3+3 adenocarcinoma in 10% of the core. Because of the small volume of prostate cancer, we could not get a reading from Oncotype DX.    11.2.2017: prostate MRI-revealed findings of a normal prostate without evidence of high-grade lesions. No NVI/SV invasion/capsular penetration/pelvic adenopathy/bony mets.    11.28.2017: Surveillance TRUS/Bx. At that time, PSA was 5.0, prostatic volume 63 mL, PSA density 0.08. All 12 cores were negative for cancer.    1.17.2022: MRI of prostate--No radiographic evidence of high-grade prostate carcinoma. PI-RADS 1: Very Low (clinically significant cancer is highly unlikely to be present).  Last PSA 06/2019--6.2  2.7.2023: Underwent repeat surveillance biopsy.  Prostate volume 110 mL.  3/12 cores returned adenocarcinoma. Left base lateral, grade group 1 in 5% of core Left apex medial, grade group 1 in 5% of core Right apex medial, grade group 1 in 30% core  8.15.2023: Here for follow-up.  His most recent PSA in June of this year [facility] was 6.7.  Hypogonadism:   He has not been on clomiphene, but on anastrazole (semingly given due to backorder of clomiophene). He has stable libido.    4.26.2022: Not on testosterone repletion but is on anastrozole.  He has not wanted to come off of that.    8.15.2023: Not on therapy for low testosterone anymore.  His most recent T level in June of this year was 497.   ED:   On tadalafil--5 mg daily   BPH   He is on tamsulosin twice a day as well as daily Cialis.  IPSS 15, quality-of-life score 3.      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   Carotid artery occlusion    right   Chronic pain disorder    left arm, shoulder and chest ;thoracic syndrome   Collapsed lung  2010   Depression    Fibromyalgia    "left shoulder area" (10/24/2011)   GERD (gastroesophageal reflux disease)    History of blood transfusion 2010   History of thoracic outlet syndrome 2008   rib removed in 2009   Insomnia due to medical condition    Neuromuscular disorder ([place])    Neuropathy of hand    left   Pneumonia 2010   PONV (postoperative nausea and vomiting)    Prostate cancer ([place])    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   RSD (reflex sympathetic dystrophy)    "left shoulder and left arm" (10/24/2011)   Sleep apnea    could not tolerate CPAP, PCP aware   Snoring     Past Surgical History:  Procedure Laterality Date   ANTERIOR CERVICAL DECOMP/DISCECTOMY FUSION  10/24/2011   ANTERIOR CERVICAL DECOMP/DISCECTOMY FUSION  10/24/2011   Procedure: ANTERIOR CERVICAL DECOMPRESSION/DISCECTOMY FUSION 2 LEVELS;  Surgeon: Melina Schools, MD;  Location: [place];  Service: Orthopedics;  Laterality: N/A;  ACDF C3-5 C-ARM SKYTRON TABLE SYNTHES VECTOR TITAN CAGE NEURO MONITORING SYTEM   BIOPSY N/A 02/25/2017   Procedure: BIOPSY DIAPHRAGMATIC PLAQUE;  Surgeon: Grace Isaac, MD;  Location: [place];  Service: Thoracic;  Laterality: N/A;   COLONOSCOPY WITH PROPOFOL N/A 08/01/2017   Procedure: COLONOSCOPY WITH PROPOFOL;  Surgeon: Rogene Houston, MD;  [facility];  Service: Endoscopy;  Laterality: N/A;  11:15   CYSTECTOMY     left hand x 3   DECORTICATION Left 02/25/2017   Procedure: DECORTICATION;  Surgeon: Grace Isaac, MD;  Location: [place];  Service: Thoracic;  Laterality: Left;   EMPYEMA DRAINAGE Left 02/25/2017   Procedure: LEFT EMPYEMA DRAINAGE;  Surgeon: Grace Isaac, MD;  Location: [place];  Service: Thoracic;  Laterality: Left;   EYE SURGERY     lasix   KNEE ARTHROSCOPY  1987   right   POLYPECTOMY  08/01/2017   Procedure: POLYPECTOMY;  Surgeon: Rogene Houston, MD;  [facility];  Service: Endoscopy;;  colon   REFRACTIVE  SURGERY  2003   bilaterally   RIB RESECTION  2009   "left anterior" (10/24/2011)   VASCULAR SURGERY     to remove  blood clots   VIDEO ASSISTED THORACOSCOPY (VATS)/EMPYEMA Left 02/25/2017   Procedure: LEFT VIDEO ASSISTED THORACOSCOPY (VATS);  Surgeon: Grace Isaac, MD;  Location: [place];  Service: Thoracic;  Laterality: Left;   VIDEO BRONCHOSCOPY N/A 02/25/2017   Procedure: VIDEO BRONCHOSCOPY;  Surgeon: Grace Isaac, MD;  [facility];  Service: Thoracic;  Laterality: N/A;    Home Medications:  Allergies as of 08/28/2021   No Known Allergies      Medication List        Accurate as of August 28, 2021  8:30 AM. If you have any questions, ask your nurse or doctor.          ALPRAZolam 0.5 MG tablet Commonly known as: XANAX Take 0.5 mg by mouth 2 (two) times daily.   anastrozole 1 MG tablet Commonly known as: ARIMIDEX Take 1 tablet (1 mg total) by mouth 3 (three) times a week. Sunday, Tuesday and Friday   aspirin EC 81 MG tablet Take 1 tablet (81 mg total) by mouth daily.   celecoxib 200 MG capsule Commonly known as: CELEBREX   cetirizine 10 MG tablet Commonly known as: ZYRTEC Take 10 mg by mouth daily.   Cinnamon 500 MG capsule Take 500 mg by mouth daily.   Coenzyme Q10 200 MG capsule Take 200 mg by mouth daily.   cyclobenzaprine 5 MG tablet Commonly known as: FLEXERIL cyclobenzaprine 5 mg tablet  TAKE 1 TABLET BY MOUTH THREE TIMES A DAY AS NEEDED   fentaNYL 12 MCG/HR Commonly known as: DURAGESIC Place 12 mcg onto the skin every other day.   Krill Oil 500 MG Caps Take 500 mg by mouth daily.   levofloxacin 750 MG tablet Commonly known as: Levaquin Take 1 tablet (750 mg total) by mouth daily. Take 1 hour prior to your procedure on 02/20/2021   mirtazapine 15 MG tablet Commonly known as: REMERON Take 15 mg by mouth at bedtime.   mometasone 50 MCG/ACT nasal spray Commonly known as: NASONEX Place 2 sprays into the nose daily as needed.    pregabalin 75 MG capsule Commonly known as: LYRICA Take 75-150 mg by mouth See admin instructions. Take 150 mg by mouth in the morning and take 75 mg by mouth at supper   PROBIOTIC DAILY PO Take 1 capsule by mouth daily.   RABEprazole 20 MG tablet Commonly known as: ACIPHEX Take 20 mg by mouth daily. PRN   saw palmetto 160 MG capsule Take 160 mg by mouth daily.   SUPER B COMPLEX/C PO Take 1 tablet by mouth daily.   tadalafil 10 MG tablet Commonly known as: CIALIS Take 1 tablet (  10 mg total) by mouth as needed for erectile dysfunction.   tadalafil 5 MG tablet Commonly known as: CIALIS TAKE ONE TABLET BY MOUTH AS NEEDED   tamsulosin 0.4 MG Caps capsule Commonly known as: FLOMAX Take 0.8 mg by mouth daily.   traZODone 50 MG tablet Commonly known as: DESYREL Take 50 mg by mouth at bedtime.   Turmeric 500 MG Caps Take 1 capsule by mouth daily.   Valium 5 MG tablet Generic drug: diazepam Take 2 tablets (10 mg total) by mouth every 6 (six) hours as needed for anxiety (take 2 tablets 2 hrs before MRI).        Allergies: No Known Allergies  Family History  Problem Relation Age of Onset   Diabetes Mother     Social History:  reports that he quit smoking about 16 years ago. His smoking use included cigarettes. He has a 30.00 pack-year smoking history. His smokeless tobacco use includes chew. He reports that he does not drink alcohol and does not use drugs.  ROS: A complete review of systems was performed.  All systems are negative except for pertinent findings as noted.  Physical Exam:  Vital signs in last 24 hours: There were no vitals taken for this visit. Constitutional:  Alert and oriented, No acute distress Cardiovascular: Regular rate  Respiratory: Normal respiratory effort Neurologic: Grossly intact, no focal deficits Psychiatric: Normal mood and affect  I have reviewed prior pt notes  I have reviewed notes from referring/previous  physicians--laboratories from Dr. Nevada Crane  I have reviewed urinalysis results-clear  I have independently reviewed prior imaging--results reviewed  I have reviewed prior pathology results   Impression/Assessment:  1.  Very low risk prostate cancer, on active surveillance.  Stable PSA, confirmatory biopsy earlier this year stable Group 1 pattern/volume  2.  BPH, symptoms stable.  On dual medical therapy  3.  History of low testosterone, most recent testosterone level normal despite being on therapy  Plan:  1.  He will continue his dual therapy for BPH  2.  I will see back in 1 year.  I will make sure he has a PSA done before that visit.  Pete PSA by Dr. Nevada Crane in about 6 months

## 2021-08-30 LAB — URINALYSIS, ROUTINE W REFLEX MICROSCOPIC
Bilirubin, UA: NEGATIVE
Glucose, UA: NEGATIVE
Ketones, UA: NEGATIVE
Leukocytes,UA: NEGATIVE
Nitrite, UA: NEGATIVE
RBC, UA: NEGATIVE
Specific Gravity, UA: 1.02 (ref 1.005–1.030)
Urobilinogen, Ur: 0.2 mg/dL (ref 0.2–1.0)
pH, UA: 5 (ref 5.0–7.5)

## 2021-10-12 DIAGNOSIS — Z23 Encounter for immunization: Secondary | ICD-10-CM | POA: Diagnosis not present

## 2021-10-12 DIAGNOSIS — R7301 Impaired fasting glucose: Secondary | ICD-10-CM | POA: Diagnosis not present

## 2021-10-12 DIAGNOSIS — D6949 Other primary thrombocytopenia: Secondary | ICD-10-CM | POA: Diagnosis not present

## 2021-10-12 DIAGNOSIS — Z0000 Encounter for general adult medical examination without abnormal findings: Secondary | ICD-10-CM | POA: Diagnosis not present

## 2021-10-12 DIAGNOSIS — E782 Mixed hyperlipidemia: Secondary | ICD-10-CM | POA: Diagnosis not present

## 2021-10-12 DIAGNOSIS — M542 Cervicalgia: Secondary | ICD-10-CM | POA: Diagnosis not present

## 2021-10-12 DIAGNOSIS — N1831 Chronic kidney disease, stage 3a: Secondary | ICD-10-CM | POA: Diagnosis not present

## 2021-10-12 DIAGNOSIS — G540 Brachial plexus disorders: Secondary | ICD-10-CM | POA: Diagnosis not present

## 2021-11-05 DIAGNOSIS — M5442 Lumbago with sciatica, left side: Secondary | ICD-10-CM | POA: Diagnosis not present

## 2021-11-05 DIAGNOSIS — M9902 Segmental and somatic dysfunction of thoracic region: Secondary | ICD-10-CM | POA: Diagnosis not present

## 2021-11-05 DIAGNOSIS — M9903 Segmental and somatic dysfunction of lumbar region: Secondary | ICD-10-CM | POA: Diagnosis not present

## 2021-11-05 DIAGNOSIS — M9905 Segmental and somatic dysfunction of pelvic region: Secondary | ICD-10-CM | POA: Diagnosis not present

## 2021-11-08 ENCOUNTER — Encounter (INDEPENDENT_AMBULATORY_CARE_PROVIDER_SITE_OTHER): Payer: Medicare Other

## 2021-11-09 ENCOUNTER — Ambulatory Visit (INDEPENDENT_AMBULATORY_CARE_PROVIDER_SITE_OTHER): Payer: Medicare Other | Admitting: Nurse Practitioner

## 2021-11-09 DIAGNOSIS — M9903 Segmental and somatic dysfunction of lumbar region: Secondary | ICD-10-CM | POA: Diagnosis not present

## 2021-11-09 DIAGNOSIS — M9902 Segmental and somatic dysfunction of thoracic region: Secondary | ICD-10-CM | POA: Diagnosis not present

## 2021-11-09 DIAGNOSIS — M5442 Lumbago with sciatica, left side: Secondary | ICD-10-CM | POA: Diagnosis not present

## 2021-11-09 DIAGNOSIS — M9905 Segmental and somatic dysfunction of pelvic region: Secondary | ICD-10-CM | POA: Diagnosis not present

## 2021-11-12 ENCOUNTER — Encounter (INDEPENDENT_AMBULATORY_CARE_PROVIDER_SITE_OTHER): Payer: Self-pay

## 2021-11-12 ENCOUNTER — Other Ambulatory Visit (INDEPENDENT_AMBULATORY_CARE_PROVIDER_SITE_OTHER): Payer: Self-pay | Admitting: Nurse Practitioner

## 2021-11-12 DIAGNOSIS — M9902 Segmental and somatic dysfunction of thoracic region: Secondary | ICD-10-CM | POA: Diagnosis not present

## 2021-11-12 DIAGNOSIS — M9903 Segmental and somatic dysfunction of lumbar region: Secondary | ICD-10-CM | POA: Diagnosis not present

## 2021-11-12 DIAGNOSIS — M9905 Segmental and somatic dysfunction of pelvic region: Secondary | ICD-10-CM | POA: Diagnosis not present

## 2021-11-12 DIAGNOSIS — I6523 Occlusion and stenosis of bilateral carotid arteries: Secondary | ICD-10-CM

## 2021-11-12 DIAGNOSIS — M5442 Lumbago with sciatica, left side: Secondary | ICD-10-CM | POA: Diagnosis not present

## 2021-11-15 ENCOUNTER — Ambulatory Visit (INDEPENDENT_AMBULATORY_CARE_PROVIDER_SITE_OTHER): Payer: Medicare Other | Admitting: Nurse Practitioner

## 2021-11-15 ENCOUNTER — Encounter (INDEPENDENT_AMBULATORY_CARE_PROVIDER_SITE_OTHER): Payer: Self-pay | Admitting: Nurse Practitioner

## 2021-11-15 ENCOUNTER — Ambulatory Visit (INDEPENDENT_AMBULATORY_CARE_PROVIDER_SITE_OTHER): Payer: Medicare Other

## 2021-11-15 VITALS — BP 137/67 | HR 84 | Resp 16 | Wt 205.4 lb

## 2021-11-15 DIAGNOSIS — I6523 Occlusion and stenosis of bilateral carotid arteries: Secondary | ICD-10-CM

## 2021-11-15 DIAGNOSIS — E785 Hyperlipidemia, unspecified: Secondary | ICD-10-CM | POA: Diagnosis not present

## 2021-11-16 DIAGNOSIS — M5442 Lumbago with sciatica, left side: Secondary | ICD-10-CM | POA: Diagnosis not present

## 2021-11-16 DIAGNOSIS — M9905 Segmental and somatic dysfunction of pelvic region: Secondary | ICD-10-CM | POA: Diagnosis not present

## 2021-11-16 DIAGNOSIS — M9902 Segmental and somatic dysfunction of thoracic region: Secondary | ICD-10-CM | POA: Diagnosis not present

## 2021-11-16 DIAGNOSIS — M9903 Segmental and somatic dysfunction of lumbar region: Secondary | ICD-10-CM | POA: Diagnosis not present

## 2021-11-16 NOTE — Progress Notes (Unsigned)
Subjective:    Patient ID: Kenneth [name], male    DOB: 1955-03-11, 66 y.o.   MRN: [id number] Chief Complaint  Patient presents with   Follow-up    Ultrasound follow up    Kenneth [name] is a 66 year old male is seen for follow up evaluation of carotid stenosis. The carotid stenosis followed by ultrasound.    The patient denies amaurosis fugax. There is no recent history of TIA symptoms or focal motor deficits. There is no prior documented CVA.   The patient is taking enteric-coated aspirin 81 mg daily.   There is no history of migraine headaches. There is no history of seizures.   The patient has a history of coronary artery disease, no recent episodes of angina or shortness of breath. The patient denies PAD or claudication symptoms. There is a history of hyperlipidemia which is being treated with a statin.     Duplex ultrasound shows right ICA occlusion which is known with left being 40-59% stenosis.  Velocities are only slightly increased from previous studies on 11/04/2020.     Review of Systems  Constitutional: Negative.   Respiratory: Negative.    Cardiovascular: Negative.   Gastrointestinal: Negative.   Genitourinary: Negative.   Neurological: Negative.   Psychiatric/Behavioral: Negative.         Objective:   Physical Exam Constitutional:      Appearance: Normal appearance. He is normal weight.  Eyes:     Pupils: Pupils are equal, round, and reactive to light.  Cardiovascular:     Rate and Rhythm: Normal rate and regular rhythm.     Pulses: Normal pulses.  Pulmonary:     Effort: Pulmonary effort is normal.  Abdominal:     General: Abdomen is flat. Bowel sounds are normal.     Palpations: Abdomen is soft.  Musculoskeletal:        General: Normal range of motion.     Cervical back: Normal range of motion.  Skin:    General: Skin is warm and dry.  Neurological:     General: No focal deficit present.     Mental Status: He is alert and oriented to person,  place, and time.  Psychiatric:        Mood and Affect: Mood normal.        Behavior: Behavior normal.     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   Carotid artery occlusion    right   Chronic pain disorder    left arm, shoulder and chest ;thoracic syndrome   Collapsed lung 2010   Depression    Fibromyalgia    "left shoulder area" (10/24/2011)   GERD (gastroesophageal reflux disease)    History of blood transfusion 2010   History of thoracic outlet syndrome 2008   rib removed in 2009   Insomnia due to medical condition    Neuromuscular disorder ([place])    Neuropathy of hand    left   Pneumonia 2010   PONV (postoperative nausea and vomiting)    Prostate cancer ([place])    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   RSD (reflex sympathetic dystrophy)    "left shoulder and left arm" (10/24/2011)   Sleep apnea    could  not tolerate CPAP, PCP aware   Snoring     Social History   Socioeconomic History   Marital status: Married    Spouse name: Not on file   Number of children: 2   Years of education: College   Highest education level: Not on file  Occupational History   Occupation: Disabled  Tobacco Use   Smoking status: Former    Packs/day: 1.00    Years: 30.00    Total pack years: 30.00    Types: Cigarettes    Quit date: 10/20/2004    Years since quitting: 17.0   Smokeless tobacco: Current    Types: Chew  Vaping Use   Vaping Use: Never used  Substance and Sexual Activity   Alcohol use: No    Alcohol/week: 0.0 standard drinks of alcohol   Drug use: No   Sexual activity: Yes  Other Topics Concern   Not on file  Social History Narrative   Drinks 2 sodas a day   Social Determinants of Health   Financial Resource Strain: Not on file  Food Insecurity: Not on file  Transportation Needs: Not on file   Physical Activity: Not on file  Stress: Not on file  Social Connections: Not on file  Intimate Partner Violence: Not on file    Past Surgical History:  Procedure Laterality Date   ANTERIOR CERVICAL DECOMP/DISCECTOMY FUSION  10/24/2011   ANTERIOR CERVICAL DECOMP/DISCECTOMY FUSION  10/24/2011   Procedure: ANTERIOR CERVICAL DECOMPRESSION/DISCECTOMY FUSION 2 LEVELS;  Surgeon: Melina Schools, MD;  Location: [place];  Service: Orthopedics;  Laterality: N/A;  ACDF C3-5 C-ARM SKYTRON TABLE SYNTHES VECTOR TITAN CAGE NEURO MONITORING SYTEM   BIOPSY N/A 02/25/2017   Procedure: BIOPSY DIAPHRAGMATIC PLAQUE;  Surgeon: Grace Isaac, MD;  Location: [place];  Service: Thoracic;  Laterality: N/A;   COLONOSCOPY WITH PROPOFOL N/A 08/01/2017   Procedure: COLONOSCOPY WITH PROPOFOL;  Surgeon: Rogene Houston, MD;  [facility];  Service: Endoscopy;  Laterality: N/A;  11:15   CYSTECTOMY     left hand x 3   DECORTICATION Left 02/25/2017   Procedure: DECORTICATION;  Surgeon: Grace Isaac, MD;  Location: [place];  Service: Thoracic;  Laterality: Left;   EMPYEMA DRAINAGE Left 02/25/2017   Procedure: LEFT EMPYEMA DRAINAGE;  Surgeon: Grace Isaac, MD;  Location: [place];  Service: Thoracic;  Laterality: Left;   EYE SURGERY     lasix   KNEE ARTHROSCOPY  1987   right   POLYPECTOMY  08/01/2017   Procedure: POLYPECTOMY;  Surgeon: Rogene Houston, MD;  [facility];  Service: Endoscopy;;  colon   REFRACTIVE SURGERY  2003   bilaterally   RIB RESECTION  2009   "left anterior" (10/24/2011)   VASCULAR SURGERY     to remove  blood clots   VIDEO ASSISTED THORACOSCOPY (VATS)/EMPYEMA Left 02/25/2017   Procedure: LEFT VIDEO ASSISTED THORACOSCOPY (VATS);  Surgeon: Grace Isaac, MD;  Location: [place];  Service: Thoracic;  Laterality: Left;   VIDEO BRONCHOSCOPY N/A 02/25/2017   Procedure: VIDEO BRONCHOSCOPY;  Surgeon: Grace Isaac, MD;  Location: [facility];  Service: Thoracic;   Laterality: N/A;    Family History  Problem Relation Age of Onset   Diabetes Mother     No Known Allergies     Latest Ref Rng & Units 07/28/2017    9:13 AM 02/28/2017    4:51 AM 02/27/2017    5:24 AM  CBC  WBC 4.0 - 10.5 K/uL 7.8  10.5  9.8   Hemoglobin 13.0 - 17.0 g/dL 13.8  10.8  9.7   Hematocrit 39.0 - 52.0 % 39.9  32.7  29.3   Platelets 150 - 400 K/uL 109  210  186       CMP     Component Value Date/Time   NA 142 07/28/2017 0913   K 4.3 07/28/2017 0913   CL 107 07/28/2017 0913   CO2 25 07/28/2017 0913   GLUCOSE 118 (H) 07/28/2017 0913   BUN 22 05/23/2020 0955   CREATININE 1.12 05/23/2020 0955   CALCIUM 8.8 (L) 07/28/2017 0913   PROT 5.5 (L) 02/27/2017 0524   ALBUMIN 2.1 (L) 02/27/2017 0524   AST 22 02/27/2017 0524   ALT 21 02/27/2017 0524   ALKPHOS 77 02/27/2017 0524   BILITOT 0.4 02/27/2017 0524   GFRNONAA >60 07/28/2017 0913   GFRAA >60 07/28/2017 0913     No results found.     Assessment & Plan:   1. Bilateral carotid artery stenosis Recommend:   Given the patient's asymptomatic subcritical stenosis no further invasive testing or surgery at this time.   Duplex ultrasound shows right ICA occlusion which is known with left being 40-59& stenosis.   Continue antiplatelet therapy as prescribed Continue management of CAD, HTN and Hyperlipidemia Healthy heart diet,  encouraged exercise at least 4 times per week Follow up in 12 months with duplex ultrasound and physical exam  - VAS US CAROTID; In one year   2. Hyperlipidemia, unspecified hyperlipidemia type Continue statin as ordered and reviewed, no changes at this time   Current Outpatient Medications on File Prior to Visit  Medication Sig Dispense Refill   ALPRAZolam (XANAX) 0.5 MG tablet Take 0.5 mg by mouth 2 (two) times daily.   5   aspirin EC 81 MG tablet Take 1 tablet (81 mg total) by mouth daily.     celecoxib (CELEBREX) 200 MG capsule      cetirizine (ZYRTEC) 10 MG tablet Take 10 mg by  mouth daily.     Cinnamon 500 MG capsule Take 500 mg by mouth daily.      Coenzyme Q10 200 MG capsule Take 200 mg by mouth daily.     cyclobenzaprine (FLEXERIL) 5 MG tablet cyclobenzaprine 5 mg tablet  TAKE 1 TABLET BY MOUTH THREE TIMES A DAY AS NEEDED     fentaNYL (DURAGESIC - DOSED MCG/HR) 12 MCG/HR Place 12 mcg onto the skin every other day.      Krill Oil 500 MG CAPS Take 500 mg by mouth daily.      levofloxacin (LEVAQUIN) 750 MG tablet Take 1 tablet (750 mg total) by mouth daily. Take 1 hour prior to your procedure on 02/20/2021 1 tablet 0   mirtazapine (REMERON) 15 MG tablet Take 15 mg by mouth at bedtime.     mometasone (NASONEX) 50 MCG/ACT nasal spray Place 2 sprays into the nose daily as needed.  12   pregabalin (LYRICA) 75 MG capsule Take 75-150 mg by mouth See admin instructions. Take 150 mg by mouth in the morning and take 75 mg by mouth at supper     Probiotic Product (PROBIOTIC DAILY PO) Take 1 capsule by mouth daily.      RABEprazole (ACIPHEX) 20 MG tablet Take 20 mg by mouth daily. PRN     rosuvastatin (CRESTOR) 5 MG tablet Take 5 mg by mouth daily.     saw palmetto 160 MG capsule Take 160 mg by mouth daily.  SUPER B COMPLEX/C PO Take 1 tablet by mouth daily.     tadalafil (CIALIS) 10 MG tablet Take 1 tablet (10 mg total) by mouth as needed for erectile dysfunction. 30 tablet 11   tadalafil (CIALIS) 5 MG tablet TAKE ONE TABLET BY MOUTH AS NEEDED 30 tablet 11   tamsulosin (FLOMAX) 0.4 MG CAPS capsule Take 0.8 mg by mouth daily.   11   traZODone (DESYREL) 50 MG tablet Take 50 mg by mouth at bedtime.   1   Turmeric 500 MG CAPS Take 1 capsule by mouth daily.     VALIUM 5 MG tablet Take 2 tablets (10 mg total) by mouth every 6 (six) hours as needed for anxiety (take 2 tablets 2 hrs before MRI). 2 tablet 0   No current facility-administered medications on file prior to visit.    There are no Patient Instructions on file for this visit. No follow-ups on file.   Kris Hartmann, NP

## 2021-11-19 ENCOUNTER — Encounter (INDEPENDENT_AMBULATORY_CARE_PROVIDER_SITE_OTHER): Payer: Self-pay | Admitting: Nurse Practitioner

## 2021-11-19 DIAGNOSIS — M9905 Segmental and somatic dysfunction of pelvic region: Secondary | ICD-10-CM | POA: Diagnosis not present

## 2021-11-19 DIAGNOSIS — M5442 Lumbago with sciatica, left side: Secondary | ICD-10-CM | POA: Diagnosis not present

## 2021-11-19 DIAGNOSIS — M9902 Segmental and somatic dysfunction of thoracic region: Secondary | ICD-10-CM | POA: Diagnosis not present

## 2021-11-19 DIAGNOSIS — M9903 Segmental and somatic dysfunction of lumbar region: Secondary | ICD-10-CM | POA: Diagnosis not present

## 2021-11-23 DIAGNOSIS — M9903 Segmental and somatic dysfunction of lumbar region: Secondary | ICD-10-CM | POA: Diagnosis not present

## 2021-11-23 DIAGNOSIS — M9902 Segmental and somatic dysfunction of thoracic region: Secondary | ICD-10-CM | POA: Diagnosis not present

## 2021-11-23 DIAGNOSIS — M9905 Segmental and somatic dysfunction of pelvic region: Secondary | ICD-10-CM | POA: Diagnosis not present

## 2021-11-23 DIAGNOSIS — M5442 Lumbago with sciatica, left side: Secondary | ICD-10-CM | POA: Diagnosis not present

## 2021-11-26 DIAGNOSIS — M9902 Segmental and somatic dysfunction of thoracic region: Secondary | ICD-10-CM | POA: Diagnosis not present

## 2021-11-26 DIAGNOSIS — M5442 Lumbago with sciatica, left side: Secondary | ICD-10-CM | POA: Diagnosis not present

## 2021-11-26 DIAGNOSIS — M9903 Segmental and somatic dysfunction of lumbar region: Secondary | ICD-10-CM | POA: Diagnosis not present

## 2021-11-26 DIAGNOSIS — M9905 Segmental and somatic dysfunction of pelvic region: Secondary | ICD-10-CM | POA: Diagnosis not present

## 2022-01-04 DIAGNOSIS — R7301 Impaired fasting glucose: Secondary | ICD-10-CM | POA: Diagnosis not present

## 2022-01-04 DIAGNOSIS — E782 Mixed hyperlipidemia: Secondary | ICD-10-CM | POA: Diagnosis not present

## 2022-01-11 DIAGNOSIS — M542 Cervicalgia: Secondary | ICD-10-CM | POA: Diagnosis not present

## 2022-01-11 DIAGNOSIS — R7301 Impaired fasting glucose: Secondary | ICD-10-CM | POA: Diagnosis not present

## 2022-01-11 DIAGNOSIS — D6949 Other primary thrombocytopenia: Secondary | ICD-10-CM | POA: Diagnosis not present

## 2022-01-11 DIAGNOSIS — E782 Mixed hyperlipidemia: Secondary | ICD-10-CM | POA: Diagnosis not present

## 2022-01-11 DIAGNOSIS — G540 Brachial plexus disorders: Secondary | ICD-10-CM | POA: Diagnosis not present

## 2022-01-11 DIAGNOSIS — I129 Hypertensive chronic kidney disease with stage 1 through stage 4 chronic kidney disease, or unspecified chronic kidney disease: Secondary | ICD-10-CM | POA: Diagnosis not present

## 2022-01-11 DIAGNOSIS — N1831 Chronic kidney disease, stage 3a: Secondary | ICD-10-CM | POA: Diagnosis not present

## 2022-02-15 ENCOUNTER — Other Ambulatory Visit: Payer: Self-pay | Admitting: Urology

## 2022-05-06 DIAGNOSIS — R194 Change in bowel habit: Secondary | ICD-10-CM | POA: Diagnosis not present

## 2022-05-06 DIAGNOSIS — G894 Chronic pain syndrome: Secondary | ICD-10-CM | POA: Diagnosis not present

## 2022-07-04 DIAGNOSIS — R7301 Impaired fasting glucose: Secondary | ICD-10-CM | POA: Diagnosis not present

## 2022-07-04 DIAGNOSIS — E782 Mixed hyperlipidemia: Secondary | ICD-10-CM | POA: Diagnosis not present

## 2022-07-16 DIAGNOSIS — R194 Change in bowel habit: Secondary | ICD-10-CM | POA: Diagnosis not present

## 2022-07-16 DIAGNOSIS — Z0000 Encounter for general adult medical examination without abnormal findings: Secondary | ICD-10-CM | POA: Diagnosis not present

## 2022-07-16 DIAGNOSIS — G540 Brachial plexus disorders: Secondary | ICD-10-CM | POA: Diagnosis not present

## 2022-07-16 DIAGNOSIS — R7301 Impaired fasting glucose: Secondary | ICD-10-CM | POA: Diagnosis not present

## 2022-07-16 DIAGNOSIS — E782 Mixed hyperlipidemia: Secondary | ICD-10-CM | POA: Diagnosis not present

## 2022-07-16 DIAGNOSIS — G894 Chronic pain syndrome: Secondary | ICD-10-CM | POA: Diagnosis not present

## 2022-07-16 DIAGNOSIS — N1831 Chronic kidney disease, stage 3a: Secondary | ICD-10-CM | POA: Diagnosis not present

## 2022-07-16 DIAGNOSIS — D6949 Other primary thrombocytopenia: Secondary | ICD-10-CM | POA: Diagnosis not present

## 2022-08-26 NOTE — Progress Notes (Signed)
History of Present Illness: Here for followup of listed issues:  PCA  11.29.2016: TRUS/Bx . At that time, PSA was 3.68. Prostatic volume was 60 mL, PSAD 0.06. 1/12 cores were positive-his core in the right apex revealed GS 3+3 adenocarcinoma in 10% of the core. Because of the small volume of prostate cancer, we could not get a reading from Oncotype DX.    11.2.2017: prostate MRI-revealed findings of a normal prostate without evidence of high-grade lesions. No NVI/SV invasion/capsular penetration/pelvic adenopathy/bony mets.    11.28.2017: Surveillance TRUS/Bx. At that time, PSA was 5.0, prostatic volume 63 mL, PSA density 0.08. All 12 cores were negative for cancer.    1.17.2022: MRI of prostate--No radiographic evidence of high-grade prostate carcinoma. PI-RADS 1: Very Low (clinically significant cancer is highly unlikely to be present).  Last PSA 06/2019--6.2  2.7.2023: Underwent repeat surveillance biopsy.  Prostate volume 110 mL.  3/12 cores returned adenocarcinoma. Left base lateral, grade group 1 in 5% of core Left apex medial, grade group 1 in 5% of core Right apex medial, grade group 1 in 30% core  8.15.2023: most recent PSA in June of this year [facility] office was 6.7.  Hypogonadism:   He has not been on clomiphene, but on anastrazole (semingly given due to backorder of clomiophene). He has stable libido.    4.26.2022: Not on testosterone repletion but is on anastrozole.  He has not wanted to come off of that.    8.15.2023: Not on therapy for low testosterone anymore.  His most recent T level in June of this year was 497.   ED:   On tadalafil--5 mg daily.     BPH   He is on tamsulosin twice a day as well as daily Cialis.  8.13.2024: He is here today for his annual follow-up.  He did not have a PSA about 6 months ago.  It was checked last month with Dr. Margo Aye and was 9 by report.  I have not received that result yet.  He has worsening ED.  He would like to see if he can  increase the Cialis dose. 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   Carotid artery occlusion    right   Chronic pain disorder    left arm, shoulder and chest ;thoracic syndrome   Collapsed lung 2010   Depression    Fibromyalgia    "left shoulder area" (10/24/2011)   GERD (gastroesophageal reflux disease)    History of blood transfusion 2010   History of thoracic outlet syndrome 2008   rib removed in 2009   Insomnia due to medical condition    Neuromuscular disorder (HCC)    Neuropathy of hand    left   Pneumonia 2010   PONV (postoperative nausea and vomiting)    Prostate cancer (HCC)    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   RSD (reflex sympathetic dystrophy)    "left shoulder and left arm" (10/24/2011)   Sleep apnea    could not tolerate CPAP, PCP aware   Snoring     Past Surgical History:  Procedure Laterality Date   ANTERIOR CERVICAL DECOMP/DISCECTOMY FUSION  10/24/2011   ANTERIOR CERVICAL DECOMP/DISCECTOMY FUSION  10/24/2011   Procedure: ANTERIOR CERVICAL DECOMPRESSION/DISCECTOMY FUSION 2 LEVELS;  Surgeon: Venita Lick, MD;  Location: [facility] OR;  Service: Orthopedics;  Laterality: N/A;  ACDF C3-5 C-ARM SKYTRON TABLE SYNTHES VECTOR TITAN  CAGE NEURO MONITORING SYTEM   BIOPSY N/A 02/25/2017   Procedure: BIOPSY DIAPHRAGMATIC PLAQUE;  Surgeon: Delight Ovens, MD;  Location: [facility] OR;  Service: Thoracic;  Laterality: N/A;   COLONOSCOPY WITH PROPOFOL N/A 08/01/2017   Procedure: COLONOSCOPY WITH PROPOFOL;  Surgeon: Malissa Hippo, MD;  [facility]: [facility];  Service: Endoscopy;  Laterality: N/A;  11:15   CYSTECTOMY     left hand x 3   DECORTICATION Left 02/25/2017   Procedure: DECORTICATION;  Surgeon: Delight Ovens, MD;  Location: [facility] OR;  Service: Thoracic;  Laterality: Left;   EMPYEMA DRAINAGE Left 02/25/2017   Procedure: LEFT EMPYEMA DRAINAGE;  Surgeon: Delight Ovens, MD;  Location: [facility]  OR;  Service: Thoracic;  Laterality: Left;   EYE SURGERY     lasix   KNEE ARTHROSCOPY  1987   right   POLYPECTOMY  08/01/2017   Procedure: POLYPECTOMY;  Surgeon: Malissa Hippo, MD;  Location: [facility];  Service: Endoscopy;;  colon   REFRACTIVE SURGERY  2003   bilaterally   RIB RESECTION  2009   "left anterior" (10/24/2011)   VASCULAR SURGERY     to remove  blood clots   VIDEO ASSISTED THORACOSCOPY (VATS)/EMPYEMA Left 02/25/2017   Procedure: LEFT VIDEO ASSISTED THORACOSCOPY (VATS);  Surgeon: Delight Ovens, MD;  Location: [facility] OR;  Service: Thoracic;  Laterality: Left;   VIDEO BRONCHOSCOPY N/A 02/25/2017   Procedure: VIDEO BRONCHOSCOPY;  Surgeon: Delight Ovens, MD;  Location: [facility] OR;  Service: Thoracic;  Laterality: N/A;    Home Medications:  Allergies as of 08/27/2022   No Known Allergies      Medication List        Accurate as of August 26, 2022 12:49 PM. If you have any questions, ask your nurse or doctor.          ALPRAZolam 0.5 MG tablet Commonly known as: XANAX Take 0.5 mg by mouth 2 (two) times daily.   aspirin EC 81 MG tablet Take 1 tablet (81 mg total) by mouth daily.   celecoxib 200 MG capsule Commonly known as: CELEBREX   cetirizine 10 MG tablet Commonly known as: ZYRTEC Take 10 mg by mouth daily.   Cinnamon 500 MG capsule Take 500 mg by mouth daily.   Coenzyme Q10 200 MG capsule Take 200 mg by mouth daily.   cyclobenzaprine 5 MG tablet Commonly known as: FLEXERIL cyclobenzaprine 5 mg tablet  TAKE 1 TABLET BY MOUTH THREE TIMES A DAY AS NEEDED   fentaNYL 12 MCG/HR Commonly known as: DURAGESIC Place 12 mcg onto the skin every other day.   Krill Oil 500 MG Caps Take 500 mg by mouth daily.   levofloxacin 750 MG tablet Commonly known as: Levaquin Take 1 tablet (750 mg total) by mouth daily. Take 1 hour prior to your procedure on 02/20/2021   mirtazapine 15 MG tablet Commonly known as: REMERON Take 15 mg by mouth at bedtime.    mometasone 50 MCG/ACT nasal spray Commonly known as: NASONEX Place 2 sprays into the nose daily as needed.   pregabalin 75 MG capsule Commonly known as: LYRICA Take 75-150 mg by mouth See admin instructions. Take 150 mg by mouth in the morning and take 75 mg by mouth at supper   PROBIOTIC DAILY PO Take 1 capsule by mouth daily.   RABEprazole 20 MG tablet Commonly known as: ACIPHEX Take 20 mg by mouth daily. PRN   rosuvastatin 5 MG tablet Commonly known as: CRESTOR Take 5  mg by mouth daily.   saw palmetto 160 MG capsule Take 160 mg by mouth daily.   SUPER B COMPLEX/C PO Take 1 tablet by mouth daily.   tadalafil 10 MG tablet Commonly known as: CIALIS Take 1 tablet (10 mg total) by mouth as needed for erectile dysfunction.   tadalafil 5 MG tablet Commonly known as: CIALIS TAKE ONE TABLET BY MOUTH AS NEEDED   tamsulosin 0.4 MG Caps capsule Commonly known as: FLOMAX Take 0.8 mg by mouth daily.   traZODone 50 MG tablet Commonly known as: DESYREL Take 50 mg by mouth at bedtime.   Turmeric 500 MG Caps Take 1 capsule by mouth daily.   Valium 5 MG tablet Generic drug: diazepam Take 2 tablets (10 mg total) by mouth every 6 (six) hours as needed for anxiety (take 2 tablets 2 hrs before MRI).        Allergies: No Known Allergies  Family History  Problem Relation Age of Onset   Diabetes Mother     Social History:  reports that he quit smoking about 17 years ago. His smoking use included cigarettes. He started smoking about 47 years ago. He has a 30 pack-year smoking history. His smokeless tobacco use includes chew. He reports that he does not drink alcohol and does not use drugs.  ROS: A complete review of systems was performed.  All systems are negative except for pertinent findings as noted.  Physical Exam:  Vital signs in last 24 hours: There were no vitals taken for this visit. Constitutional:  Alert and oriented, No acute distress Cardiovascular: Regular  rate  Respiratory: Normal respiratory effort Genitourinary: Normal anal sphincter tone.  Prostate 80 g, symmetric, nonnodular, nontender. Lymphatic: No lymphadenopathy Neurologic: Grossly intact, no focal deficits Psychiatric: Normal mood and affect  I have reviewed prior pt notes  I have reviewed urinalysis results  I have independently reviewed prior imaging--prostate ultrasound  I have reviewed prior PSA results  I have reviewed IPSS status form   Impression/Assessment:  Grade group 1 prostate cancer, on active surveillance with stable biopsies/PSA  ED, worsening somewhat  BPH, on dual medical therapy  Plan:  I will increase his as needed Cialis to between 10 and 20 mg at a time  We will call Dr. Scharlene Gloss office for most recent PSA  Prior asked Phillipe to try to get a PSA done with Dr. Margo Aye in about 6 months and then again in a year  I will have him come back to see me in about a year

## 2022-08-27 ENCOUNTER — Ambulatory Visit: Payer: Medicare Other | Admitting: Urology

## 2022-08-27 ENCOUNTER — Encounter: Payer: Self-pay | Admitting: Urology

## 2022-08-27 VITALS — BP 125/68 | HR 84

## 2022-08-27 DIAGNOSIS — C61 Malignant neoplasm of prostate: Secondary | ICD-10-CM | POA: Diagnosis not present

## 2022-08-27 DIAGNOSIS — N401 Enlarged prostate with lower urinary tract symptoms: Secondary | ICD-10-CM | POA: Diagnosis not present

## 2022-08-27 DIAGNOSIS — R7989 Other specified abnormal findings of blood chemistry: Secondary | ICD-10-CM | POA: Diagnosis not present

## 2022-08-27 DIAGNOSIS — N138 Other obstructive and reflux uropathy: Secondary | ICD-10-CM

## 2022-08-27 DIAGNOSIS — N529 Male erectile dysfunction, unspecified: Secondary | ICD-10-CM

## 2022-08-27 LAB — URINALYSIS, ROUTINE W REFLEX MICROSCOPIC
Bilirubin, UA: NEGATIVE
Glucose, UA: NEGATIVE
Ketones, UA: NEGATIVE
Leukocytes,UA: NEGATIVE
Nitrite, UA: NEGATIVE
Protein,UA: NEGATIVE
RBC, UA: NEGATIVE
Specific Gravity, UA: 1.025 (ref 1.005–1.030)
Urobilinogen, Ur: 1 mg/dL (ref 0.2–1.0)
pH, UA: 6 (ref 5.0–7.5)

## 2022-08-27 LAB — BLADDER SCAN AMB NON-IMAGING: Scan Result: 26

## 2022-08-27 MED ORDER — TADALAFIL 5 MG PO TABS
5.0000 mg | ORAL_TABLET | ORAL | 3 refills | Status: DC | PRN
Start: 2022-08-27 — End: 2022-08-29

## 2022-08-29 ENCOUNTER — Other Ambulatory Visit: Payer: Self-pay

## 2022-08-29 DIAGNOSIS — N401 Enlarged prostate with lower urinary tract symptoms: Secondary | ICD-10-CM

## 2022-08-29 MED ORDER — TADALAFIL 5 MG PO TABS: 5.0000 mg | ORAL_TABLET | ORAL | 3 refills | Status: DC | PRN

## 2022-10-15 DIAGNOSIS — B9689 Other specified bacterial agents as the cause of diseases classified elsewhere: Secondary | ICD-10-CM | POA: Diagnosis not present

## 2022-10-15 DIAGNOSIS — J329 Chronic sinusitis, unspecified: Secondary | ICD-10-CM | POA: Diagnosis not present

## 2022-10-31 DIAGNOSIS — G894 Chronic pain syndrome: Secondary | ICD-10-CM | POA: Diagnosis not present

## 2022-10-31 DIAGNOSIS — M542 Cervicalgia: Secondary | ICD-10-CM | POA: Diagnosis not present

## 2022-10-31 DIAGNOSIS — Z713 Dietary counseling and surveillance: Secondary | ICD-10-CM | POA: Diagnosis not present

## 2022-10-31 DIAGNOSIS — M5430 Sciatica, unspecified side: Secondary | ICD-10-CM | POA: Diagnosis not present

## 2022-10-31 DIAGNOSIS — Z23 Encounter for immunization: Secondary | ICD-10-CM | POA: Diagnosis not present

## 2022-10-31 DIAGNOSIS — M5440 Lumbago with sciatica, unspecified side: Secondary | ICD-10-CM | POA: Diagnosis not present

## 2022-10-31 DIAGNOSIS — E782 Mixed hyperlipidemia: Secondary | ICD-10-CM | POA: Diagnosis not present

## 2022-10-31 DIAGNOSIS — Z79899 Other long term (current) drug therapy: Secondary | ICD-10-CM | POA: Diagnosis not present

## 2022-11-15 ENCOUNTER — Other Ambulatory Visit (INDEPENDENT_AMBULATORY_CARE_PROVIDER_SITE_OTHER): Payer: Self-pay | Admitting: Nurse Practitioner

## 2022-11-15 DIAGNOSIS — I6523 Occlusion and stenosis of bilateral carotid arteries: Secondary | ICD-10-CM

## 2022-11-19 ENCOUNTER — Encounter (INDEPENDENT_AMBULATORY_CARE_PROVIDER_SITE_OTHER): Payer: Self-pay | Admitting: Vascular Surgery

## 2022-11-19 ENCOUNTER — Ambulatory Visit (INDEPENDENT_AMBULATORY_CARE_PROVIDER_SITE_OTHER): Payer: Medicare Other | Admitting: Vascular Surgery

## 2022-11-19 ENCOUNTER — Ambulatory Visit (INDEPENDENT_AMBULATORY_CARE_PROVIDER_SITE_OTHER): Payer: Medicare Other

## 2022-11-19 VITALS — BP 150/78 | HR 78 | Resp 18 | Ht 74.0 in | Wt 205.8 lb

## 2022-11-19 DIAGNOSIS — I6523 Occlusion and stenosis of bilateral carotid arteries: Secondary | ICD-10-CM

## 2022-11-19 DIAGNOSIS — Z8669 Personal history of other diseases of the nervous system and sense organs: Secondary | ICD-10-CM | POA: Diagnosis not present

## 2022-11-19 DIAGNOSIS — I6529 Occlusion and stenosis of unspecified carotid artery: Secondary | ICD-10-CM | POA: Insufficient documentation

## 2022-11-19 NOTE — Progress Notes (Signed)
MRN : [id number]  Kenneth [name] is a 67 y.o. (April 27, 1955) male who presents with chief complaint of  Chief Complaint  Patient presents with   Follow-up    1 year follow up with carotid  .  History of Present Illness: Patient returns in follow-up of his carotid disease.  He is an established patient and we have seen him for over 15 years now.  We performed a first rib resection on him over 15 years ago.  He has had chronic neuropathic issues and has some chronic pain syndromes but is doing pretty well and staying very active.  From a carotid standpoint, he has no current symptoms. Specifically, the patient denies amaurosis fugax, speech or swallowing difficulties, or arm or leg weakness or numbness. Carotid duplex shows a known chronic right carotid artery occlusion with velocities in the 40 to 59% range on the left.  This is stable and unchanged from previous studies.   Current Outpatient Medications  Medication Sig Dispense Refill   ALPRAZolam (XANAX) 0.5 MG tablet Take 0.5 mg by mouth 2 (two) times daily.   5   celecoxib (CELEBREX) 200 MG capsule      cetirizine (ZYRTEC) 10 MG tablet Take 10 mg by mouth daily.     Cinnamon 500 MG capsule Take 500 mg by mouth daily.      Coenzyme Q10 200 MG capsule Take 200 mg by mouth daily.     cyclobenzaprine (FLEXERIL) 5 MG tablet cyclobenzaprine 5 mg tablet  TAKE 1 TABLET BY MOUTH THREE TIMES A DAY AS NEEDED     fentaNYL (DURAGESIC - DOSED MCG/HR) 12 MCG/HR Place 12 mcg onto the skin every other day.      Krill Oil 500 MG CAPS Take 500 mg by mouth daily.      mirtazapine (REMERON) 15 MG tablet Take 15 mg by mouth at bedtime.     mometasone (NASONEX) 50 MCG/ACT nasal spray Place 2 sprays into the nose daily as needed.  12   naproxen sodium (ALEVE) 220 MG tablet Take 220 mg by mouth daily as needed.     pregabalin (LYRICA) 75 MG capsule Take 75-150 mg by mouth See admin instructions. Take 150 mg by mouth in the morning and take 75 mg by mouth at  supper     Probiotic Product (PROBIOTIC DAILY PO) Take 1 capsule by mouth daily.      RABEprazole (ACIPHEX) 20 MG tablet Take 20 mg by mouth daily. PRN     rosuvastatin (CRESTOR) 5 MG tablet Take 5 mg by mouth daily.     saw palmetto 160 MG capsule Take 160 mg by mouth daily.     SUPER B COMPLEX/C PO Take 1 tablet by mouth daily.     tadalafil (CIALIS) 5 MG tablet Take 1 tablet (5 mg total) by mouth as needed for erectile dysfunction. 90 tablet 3   tamsulosin (FLOMAX) 0.4 MG CAPS capsule Take 0.8 mg by mouth daily.   11   traMADol (ULTRAM) 50 MG tablet take 1 twice a day as needed take 1 twice a day as needed     traZODone (DESYREL) 50 MG tablet Take 50 mg by mouth at bedtime.   1   Turmeric 500 MG CAPS Take 1 capsule by mouth daily.     aspirin EC 81 MG tablet Take 1 tablet (81 mg total) by mouth daily. (Patient not taking: Reported on 11/19/2022)     levofloxacin (LEVAQUIN) 750 MG tablet Take 1 tablet (  750 mg total) by mouth daily. Take 1 hour prior to your procedure on 02/20/2021 (Patient not taking: Reported on 08/27/2022) 1 tablet 0   VALIUM 5 MG tablet Take 2 tablets (10 mg total) by mouth every 6 (six) hours as needed for anxiety (take 2 tablets 2 hrs before MRI). (Patient not taking: Reported on 11/19/2022) 2 tablet 0   No current facility-administered medications for this visit.    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   Carotid artery occlusion    right   Chronic pain disorder    left arm, shoulder and chest ;thoracic syndrome   Collapsed lung 2010   Depression    Fibromyalgia    "left shoulder area" (10/24/2011)   GERD (gastroesophageal reflux disease)    History of blood transfusion 2010   History of thoracic outlet syndrome 2008   rib removed in 2009   Insomnia due to medical condition    Neuromuscular disorder (HCC)    Neuropathy of hand    left   Pneumonia 2010   PONV (postoperative nausea and vomiting)    Prostate cancer  (HCC)    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   RSD (reflex sympathetic dystrophy)    "left shoulder and left arm" (10/24/2011)   Sleep apnea    could not tolerate CPAP, PCP aware   Snoring     Past Surgical History:  Procedure Laterality Date   ANTERIOR CERVICAL DECOMP/DISCECTOMY FUSION  10/24/2011   ANTERIOR CERVICAL DECOMP/DISCECTOMY FUSION  10/24/2011   Procedure: ANTERIOR CERVICAL DECOMPRESSION/DISCECTOMY FUSION 2 LEVELS;  Surgeon: Venita Lick, MD;  Location: [facility] OR;  Service: Orthopedics;  Laterality: N/A;  ACDF C3-5 C-ARM SKYTRON TABLE SYNTHES VECTOR TITAN CAGE NEURO MONITORING SYTEM   BIOPSY N/A 02/25/2017   Procedure: BIOPSY DIAPHRAGMATIC PLAQUE;  Surgeon: Delight Ovens, MD;  Location: [facility] OR;  Service: Thoracic;  Laterality: N/A;   COLONOSCOPY WITH PROPOFOL N/A 08/01/2017   Procedure: COLONOSCOPY WITH PROPOFOL;  Surgeon: Malissa Hippo, MD;  [facility]: [facility];  Service: Endoscopy;  Laterality: N/A;  11:15   CYSTECTOMY     left hand x 3   DECORTICATION Left 02/25/2017   Procedure: DECORTICATION;  Surgeon: Delight Ovens, MD;  Location: [facility] OR;  Service: Thoracic;  Laterality: Left;   EMPYEMA DRAINAGE Left 02/25/2017   Procedure: LEFT EMPYEMA DRAINAGE;  Surgeon: Delight Ovens, MD;  Location: [facility] OR;  Service: Thoracic;  Laterality: Left;   EYE SURGERY     lasix   KNEE ARTHROSCOPY  1987   right   POLYPECTOMY  08/01/2017   Procedure: POLYPECTOMY;  Surgeon: Malissa Hippo, MD;  [facility]: [facility];  Service: Endoscopy;;  colon   REFRACTIVE SURGERY  2003   bilaterally   RIB RESECTION  2009   "left anterior" (10/24/2011)   VASCULAR SURGERY     to remove  blood clots   VIDEO ASSISTED THORACOSCOPY (VATS)/EMPYEMA Left 02/25/2017   Procedure: LEFT VIDEO ASSISTED THORACOSCOPY (VATS);  Surgeon: Delight Ovens, MD;  Location: [facility] OR;  Service: Thoracic;  Laterality: Left;   VIDEO BRONCHOSCOPY N/A 02/25/2017   Procedure: VIDEO BRONCHOSCOPY;   Surgeon: Delight Ovens, MD;  Location: [facility] OR;  Service: Thoracic;  Laterality: N/A;     Social History   Tobacco Use   Smoking status: Former    Current packs/day: 0.00    Average packs/day: 1  pack/day for 30.0 years (30.0 ttl pk-yrs)    Types: Cigarettes    Start date: 10/21/1974    Quit date: 10/20/2004    Years since quitting: 18.0   Smokeless tobacco: Current    Types: Chew  Vaping Use   Vaping status: Never Used  Substance Use Topics   Alcohol use: No    Alcohol/week: 0.0 standard drinks of alcohol   Drug use: No      Family History  Problem Relation Age of Onset   Diabetes Mother      No Known Allergies   REVIEW OF SYSTEMS (Negative unless checked)  Constitutional: [] Weight loss  [] Fever  [] Chills Cardiac: [] Chest pain   [] Chest pressure   [] Palpitations   [] Shortness of breath when laying flat   [] Shortness of breath at rest   [] Shortness of breath with exertion. Vascular:  [] Pain in legs with walking   [] Pain in legs at rest   [] Pain in legs when laying flat   [] Claudication   [] Pain in feet when walking  [] Pain in feet at rest  [] Pain in feet when laying flat   [] History of DVT   [] Phlebitis   [] Swelling in legs   [] Varicose veins   [] Non-healing ulcers Pulmonary:   [] Uses home oxygen   [] Productive cough   [] Hemoptysis   [] Wheeze  [] COPD   [] Asthma Neurologic:  [] Dizziness  [] Blackouts   [] Seizures   [] History of stroke   [] History of TIA  [] Aphasia   [] Temporary blindness   [] Dysphagia   [x] Weakness or numbness in arms   [] Weakness or numbness in legs Musculoskeletal:  [] Arthritis   [] Joint swelling   [x] Joint pain   [] Low back pain Hematologic:  [] Easy bruising  [] Easy bleeding   [] Hypercoagulable state   [] Anemic  [] Hepatitis Gastrointestinal:  [] Blood in stool   [] Vomiting blood  [x] Gastroesophageal reflux/heartburn   [] Difficulty swallowing. Genitourinary:  [] Chronic kidney disease   [] Difficult urination  [] Frequent urination  [] Burning with urination    [] Blood in urine Skin:  [] Rashes   [] Ulcers   [] Wounds Psychological:  [x] History of anxiety   [x]  History of major depression.  Physical Examination  Vitals:   11/19/22 1531  BP: (!) 150/78  Pulse: 78  Resp: 18  Weight: 205 lb 12.8 oz (93.4 kg)  Height: 6\' 2"  (1.88 m)   Body mass index is 26.42 kg/m. Gen:  WD/WN, NAD. Appears younger than stated age. Head: [place]/AT, No temporalis wasting. Ear/Nose/Throat: Hearing grossly intact, nares w/o erythema or drainage, trachea midline Eyes: Conjunctiva clear. Sclera non-icteric Neck: Supple.  Soft right carotid bruit  Pulmonary:  Good air movement, equal and clear to auscultation bilaterally.  Cardiac: RRR, No JVD Vascular:  Vessel Right Left  Radial Palpable Palpable                   Musculoskeletal: M/S 5/5 throughout.  No deformity or atrophy. No edema. Neurologic: CN 2-12 intact. Sensation grossly intact in extremities.  Symmetrical.  Speech is fluent. Motor exam as listed above. Psychiatric: Judgment intact, Mood & affect appropriate for pt's clinical situation. Dermatologic: No rashes or ulcers noted.  No cellulitis or open wounds.     CBC Lab Results  Component Value Date   WBC 7.8 07/28/2017   HGB 13.8 07/28/2017   HCT 39.9 07/28/2017   MCV 91.1 07/28/2017   PLT 109 (L) 07/28/2017    BMET    Component Value Date/Time   NA 142 07/28/2017 0913   K 4.3 07/28/2017 0913  CL 107 07/28/2017 0913   CO2 25 07/28/2017 0913   GLUCOSE 118 (H) 07/28/2017 0913   BUN 22 05/23/2020 0955   CREATININE 1.12 05/23/2020 0955   CALCIUM 8.8 (L) 07/28/2017 0913   GFRNONAA >60 07/28/2017 0913   GFRAA >60 07/28/2017 0913   CrCl cannot be calculated (Patient's most recent lab result is older than the maximum 21 days allowed.).  COAG Lab Results  Component Value Date   INR 1.36 02/25/2017   INR 1.02 10/21/2011   INR 1.3 06/09/2008    Radiology No results found.   Assessment/Plan Carotid stenosis Carotid duplex shows  a known chronic right carotid artery occlusion with velocities in the 40 to 59% range on the left.  This is stable and unchanged from previous studies.  Continue to follow on an annual basis.  History of thoracic outlet syndrome Status post first rib resection over 15 years ago.  Has chronic neuropathic issues but these are stable and not worse.    Festus Barren, MD  11/19/2022 4:26 PM    This note was created with Dragon medical transcription system.  Any errors from dictation are purely unintentional

## 2022-11-19 NOTE — Assessment & Plan Note (Signed)
Carotid duplex shows a known chronic right carotid artery occlusion with velocities in the 40 to 59% range on the left.  This is stable and unchanged from previous studies.  Continue to follow on an annual basis.

## 2022-11-19 NOTE — Assessment & Plan Note (Signed)
Status post first rib resection over 15 years ago.  Has chronic neuropathic issues but these are stable and not worse.

## 2022-12-06 DIAGNOSIS — F17290 Nicotine dependence, other tobacco product, uncomplicated: Secondary | ICD-10-CM | POA: Diagnosis not present

## 2022-12-06 DIAGNOSIS — J0100 Acute maxillary sinusitis, unspecified: Secondary | ICD-10-CM | POA: Diagnosis not present

## 2022-12-06 DIAGNOSIS — R059 Cough, unspecified: Secondary | ICD-10-CM | POA: Diagnosis not present

## 2023-01-16 DIAGNOSIS — R7301 Impaired fasting glucose: Secondary | ICD-10-CM | POA: Diagnosis not present

## 2023-01-16 DIAGNOSIS — E782 Mixed hyperlipidemia: Secondary | ICD-10-CM | POA: Diagnosis not present

## 2023-01-21 DIAGNOSIS — R7301 Impaired fasting glucose: Secondary | ICD-10-CM | POA: Diagnosis not present

## 2023-01-21 DIAGNOSIS — G894 Chronic pain syndrome: Secondary | ICD-10-CM | POA: Diagnosis not present

## 2023-01-21 DIAGNOSIS — I129 Hypertensive chronic kidney disease with stage 1 through stage 4 chronic kidney disease, or unspecified chronic kidney disease: Secondary | ICD-10-CM | POA: Diagnosis not present

## 2023-01-21 DIAGNOSIS — N1831 Chronic kidney disease, stage 3a: Secondary | ICD-10-CM | POA: Diagnosis not present

## 2023-01-21 DIAGNOSIS — E782 Mixed hyperlipidemia: Secondary | ICD-10-CM | POA: Diagnosis not present

## 2023-01-21 DIAGNOSIS — M542 Cervicalgia: Secondary | ICD-10-CM | POA: Diagnosis not present

## 2023-01-21 DIAGNOSIS — R194 Change in bowel habit: Secondary | ICD-10-CM | POA: Diagnosis not present

## 2023-03-04 ENCOUNTER — Other Ambulatory Visit: Payer: Self-pay | Admitting: Urology

## 2023-03-04 DIAGNOSIS — N138 Other obstructive and reflux uropathy: Secondary | ICD-10-CM

## 2023-03-13 DIAGNOSIS — J069 Acute upper respiratory infection, unspecified: Secondary | ICD-10-CM | POA: Diagnosis not present

## 2023-04-21 DIAGNOSIS — M5440 Lumbago with sciatica, unspecified side: Secondary | ICD-10-CM | POA: Diagnosis not present

## 2023-04-21 DIAGNOSIS — M545 Low back pain, unspecified: Secondary | ICD-10-CM | POA: Diagnosis not present

## 2023-04-21 DIAGNOSIS — G894 Chronic pain syndrome: Secondary | ICD-10-CM | POA: Diagnosis not present

## 2023-04-21 DIAGNOSIS — M542 Cervicalgia: Secondary | ICD-10-CM | POA: Diagnosis not present

## 2023-06-03 ENCOUNTER — Encounter (INDEPENDENT_AMBULATORY_CARE_PROVIDER_SITE_OTHER): Payer: Self-pay

## 2023-07-15 DIAGNOSIS — E782 Mixed hyperlipidemia: Secondary | ICD-10-CM | POA: Diagnosis not present

## 2023-07-15 DIAGNOSIS — R7301 Impaired fasting glucose: Secondary | ICD-10-CM | POA: Diagnosis not present

## 2023-07-29 DIAGNOSIS — M5442 Lumbago with sciatica, left side: Secondary | ICD-10-CM | POA: Diagnosis not present

## 2023-07-29 DIAGNOSIS — N1831 Chronic kidney disease, stage 3a: Secondary | ICD-10-CM | POA: Diagnosis not present

## 2023-07-29 DIAGNOSIS — R7301 Impaired fasting glucose: Secondary | ICD-10-CM | POA: Diagnosis not present

## 2023-07-29 DIAGNOSIS — D6949 Other primary thrombocytopenia: Secondary | ICD-10-CM | POA: Diagnosis not present

## 2023-07-29 DIAGNOSIS — E782 Mixed hyperlipidemia: Secondary | ICD-10-CM | POA: Diagnosis not present

## 2023-07-29 DIAGNOSIS — G894 Chronic pain syndrome: Secondary | ICD-10-CM | POA: Diagnosis not present

## 2023-07-29 DIAGNOSIS — R194 Change in bowel habit: Secondary | ICD-10-CM | POA: Diagnosis not present

## 2023-07-29 DIAGNOSIS — M5441 Lumbago with sciatica, right side: Secondary | ICD-10-CM | POA: Diagnosis not present

## 2023-10-30 DIAGNOSIS — M5441 Lumbago with sciatica, right side: Secondary | ICD-10-CM | POA: Diagnosis not present

## 2023-10-30 DIAGNOSIS — D6949 Other primary thrombocytopenia: Secondary | ICD-10-CM | POA: Diagnosis not present

## 2023-10-30 DIAGNOSIS — Z0001 Encounter for general adult medical examination with abnormal findings: Secondary | ICD-10-CM | POA: Diagnosis not present

## 2023-10-30 DIAGNOSIS — Z0000 Encounter for general adult medical examination without abnormal findings: Secondary | ICD-10-CM | POA: Diagnosis not present

## 2023-10-30 DIAGNOSIS — G894 Chronic pain syndrome: Secondary | ICD-10-CM | POA: Diagnosis not present

## 2023-10-30 DIAGNOSIS — M5442 Lumbago with sciatica, left side: Secondary | ICD-10-CM | POA: Diagnosis not present

## 2023-10-30 DIAGNOSIS — E782 Mixed hyperlipidemia: Secondary | ICD-10-CM | POA: Diagnosis not present

## 2023-10-30 DIAGNOSIS — R194 Change in bowel habit: Secondary | ICD-10-CM | POA: Diagnosis not present

## 2023-11-18 ENCOUNTER — Ambulatory Visit (INDEPENDENT_AMBULATORY_CARE_PROVIDER_SITE_OTHER): Payer: Medicare Other

## 2023-11-18 ENCOUNTER — Encounter (INDEPENDENT_AMBULATORY_CARE_PROVIDER_SITE_OTHER): Payer: Self-pay | Admitting: Vascular Surgery

## 2023-11-18 ENCOUNTER — Ambulatory Visit (INDEPENDENT_AMBULATORY_CARE_PROVIDER_SITE_OTHER): Payer: Medicare Other | Admitting: Vascular Surgery

## 2023-11-18 VITALS — BP 131/66 | HR 76 | Resp 18 | Ht 74.0 in | Wt 197.4 lb

## 2023-11-18 DIAGNOSIS — Z8669 Personal history of other diseases of the nervous system and sense organs: Secondary | ICD-10-CM

## 2023-11-18 DIAGNOSIS — I6523 Occlusion and stenosis of bilateral carotid arteries: Secondary | ICD-10-CM

## 2023-11-18 NOTE — Assessment & Plan Note (Signed)
 Carotid duplex today demonstrates a known right carotid artery occlusion and stable velocities just into the 40 to 59% range.  We discussed that the increased velocities may be in part due to compensatory flow with contralateral occlusion.  These are certainly not at a worrisome level and we will continue to follow these on an annual basis with duplex.

## 2023-11-18 NOTE — Progress Notes (Signed)
 MRN : [id number]  Kenneth [name] is a 68 y.o. (1955/09/28) male who presents with chief complaint of  Chief Complaint  Patient presents with   Follow-up    1 year carotid  .  History of Present Illness: Patient returns in follow-up of his carotid disease.  He is doing well today.  He denies any focal neurologic symptoms. Specifically, the patient denies amaurosis fugax, speech or swallowing difficulties, or arm or leg weakness or numbness. Carotid duplex today demonstrates a known right carotid artery occlusion and stable velocities just into the 40 to 59% range.  Current Outpatient Medications  Medication Sig Dispense Refill   ALPRAZolam  (XANAX ) 0.5 MG tablet Take 0.5 mg by mouth 2 (two) times daily.   5   celecoxib (CELEBREX) 200 MG capsule      cetirizine (ZYRTEC) 10 MG tablet Take 10 mg by mouth daily.     Cinnamon 500 MG capsule Take 500 mg by mouth daily.      Coenzyme Q10 200 MG capsule Take 200 mg by mouth daily.     cyclobenzaprine  (FLEXERIL ) 5 MG tablet cyclobenzaprine  5 mg tablet  TAKE 1 TABLET BY MOUTH THREE TIMES A DAY AS NEEDED     fentaNYL  (DURAGESIC  - DOSED MCG/HR) 12 MCG/HR Place 12 mcg onto the skin every other day.      Krill Oil 500 MG CAPS Take 500 mg by mouth daily.      mirtazapine  (REMERON ) 15 MG tablet Take 15 mg by mouth at bedtime.     mometasone (NASONEX) 50 MCG/ACT nasal spray Place 2 sprays into the nose daily as needed.  12   pregabalin  (LYRICA ) 75 MG capsule Take 75-150 mg by mouth See admin instructions. Take 150 mg by mouth in the morning and take 75 mg by mouth at supper     Probiotic Product (PROBIOTIC DAILY PO) Take 1 capsule by mouth daily.      RABEprazole (ACIPHEX) 20 MG tablet Take 20 mg by mouth daily. PRN     rosuvastatin (CRESTOR) 5 MG tablet Take 5 mg by mouth daily.     saw palmetto 160 MG capsule Take 160 mg by mouth daily.     SUPER B COMPLEX/C PO Take 1 tablet by mouth daily.     tadalafil  (CIALIS ) 5 MG tablet TAKE ONE TABLET BY MOUTH  AS NEEDED 30 tablet 11   tamsulosin  (FLOMAX ) 0.4 MG CAPS capsule Take 0.8 mg by mouth daily.   11   traMADol  (ULTRAM ) 50 MG tablet take 1 twice a day as needed take 1 twice a day as needed     traZODone  (DESYREL ) 50 MG tablet Take 50 mg by mouth at bedtime.   1   Turmeric 500 MG CAPS Take 1 capsule by mouth daily.     aspirin EC 81 MG tablet Take 1 tablet (81 mg total) by mouth daily. (Patient not taking: Reported on 11/19/2022)     levofloxacin  (LEVAQUIN ) 750 MG tablet Take 1 tablet (750 mg total) by mouth daily. Take 1 hour prior to your procedure on 02/20/2021 (Patient not taking: Reported on 08/27/2022) 1 tablet 0   naproxen sodium (ALEVE) 220 MG tablet Take 220 mg by mouth daily as needed.     VALIUM  5 MG tablet Take 2 tablets (10 mg total) by mouth every 6 (six) hours as needed for anxiety (take 2 tablets 2 hrs before MRI). (Patient not taking: Reported on 11/19/2022) 2 tablet 0   No current facility-administered medications  for this visit.    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   Carotid artery occlusion    right   Chronic pain disorder    left arm, shoulder and chest ;thoracic syndrome   Collapsed lung 2010   Depression    Fibromyalgia    left shoulder area (10/24/2011)   GERD (gastroesophageal reflux disease)    History of blood transfusion 2010   History of thoracic outlet syndrome 2008   rib removed in 2009   Insomnia due to medical condition    Neuromuscular disorder (HCC)    Neuropathy of hand    left   Pneumonia 2010   PONV (postoperative nausea and vomiting)    Prostate cancer (HCC)    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   RSD (reflex sympathetic dystrophy)    left shoulder and left arm (10/24/2011)   Sleep apnea    could not tolerate CPAP, PCP aware   Snoring     Past Surgical History:  Procedure Laterality Date   ANTERIOR CERVICAL DECOMP/DISCECTOMY FUSION  10/24/2011   ANTERIOR CERVICAL  DECOMP/DISCECTOMY FUSION  10/24/2011   Procedure: ANTERIOR CERVICAL DECOMPRESSION/DISCECTOMY FUSION 2 LEVELS;  Surgeon: Donaciano Sprang, MD;  Location: [facility] OR;  Service: Orthopedics;  Laterality: N/A;  ACDF C3-5 C-ARM SKYTRON TABLE SYNTHES VECTOR TITAN CAGE NEURO MONITORING SYTEM   BIOPSY N/A 02/25/2017   Procedure: BIOPSY DIAPHRAGMATIC PLAQUE;  Surgeon: Army Dallas NOVAK, MD;  Location[facility] OR;  Service: Thoracic;  Laterality: N/A;   COLONOSCOPY WITH PROPOFOL  N/A 08/01/2017   Procedure: COLONOSCOPY WITH PROPOFOL ;  Surgeon: Golda Claudis PENNER, MD;  [facility];  Service: Endoscopy;  Laterality: N/A;  11:15   CYSTECTOMY     left hand x 3   DECORTICATION Left 02/25/2017   Procedure: DECORTICATION;  Surgeon: Army Dallas NOVAK, MD;  Location: [facility] OR;  Service: Thoracic;  Laterality: Left;   EMPYEMA DRAINAGE Left 02/25/2017   Procedure: LEFT EMPYEMA DRAINAGE;  Surgeon: Army Dallas NOVAK, MD;  Location: [facility] OR;  Service: Thoracic;  Laterality: Left;   EYE SURGERY     lasix   KNEE ARTHROSCOPY  1987   right   POLYPECTOMY  08/01/2017   Procedure: POLYPECTOMY;  Surgeon: Golda Claudis PENNER, MD;  [facility];  Service: Endoscopy;;  colon   REFRACTIVE SURGERY  2003   bilaterally   RIB RESECTION  2009   left anterior (10/24/2011)   VASCULAR SURGERY     to remove  blood clots   VIDEO ASSISTED THORACOSCOPY (VATS)/EMPYEMA Left 02/25/2017   Procedure: LEFT VIDEO ASSISTED THORACOSCOPY (VATS);  Surgeon: Army Dallas NOVAK, MD;  Location: [facility] OR;  Service: Thoracic;  Laterality: Left;   VIDEO BRONCHOSCOPY N/A 02/25/2017   Procedure: VIDEO BRONCHOSCOPY;  Surgeon: Army Dallas NOVAK, MD;  Location: [facility] OR;  Service: Thoracic;  Laterality: N/A;     Social History   Tobacco Use   Smoking status: Former    Current packs/day: 0.00    Average packs/day: 1 pack/day for 30.0 years (30.0 ttl pk-yrs)    Types: Cigarettes    Start date: 10/21/1974    Quit date: 10/20/2004    Years since quitting:  19.0   Smokeless tobacco: Current    Types: Chew  Vaping Use   Vaping status: Never Used  Substance Use Topics   Alcohol use: No    Alcohol/week: 0.0 standard drinks of alcohol  Drug use: No      Family History  Problem Relation Age of Onset   Diabetes Mother      No Known Allergies     REVIEW OF SYSTEMS (Negative unless checked)   Constitutional: [] Weight loss  [] Fever  [] Chills Cardiac: [] Chest pain   [] Chest pressure   [] Palpitations   [] Shortness of breath when laying flat   [] Shortness of breath at rest   [] Shortness of breath with exertion. Vascular:  [] Pain in legs with walking   [] Pain in legs at rest   [] Pain in legs when laying flat   [] Claudication   [] Pain in feet when walking  [] Pain in feet at rest  [] Pain in feet when laying flat   [] History of DVT   [] Phlebitis   [] Swelling in legs   [] Varicose veins   [] Non-healing ulcers Pulmonary:   [] Uses home oxygen   [] Productive cough   [] Hemoptysis   [] Wheeze  [] COPD   [] Asthma Neurologic:  [] Dizziness  [] Blackouts   [] Seizures   [] History of stroke   [] History of TIA  [] Aphasia   [] Temporary blindness   [] Dysphagia   [x] Weakness or numbness in arms   [] Weakness or numbness in legs Musculoskeletal:  [] Arthritis   [] Joint swelling   [x] Joint pain   [] Low back pain Hematologic:  [] Easy bruising  [] Easy bleeding   [] Hypercoagulable state   [] Anemic  [] Hepatitis Gastrointestinal:  [] Blood in stool   [] Vomiting blood  [x] Gastroesophageal reflux/heartburn   [] Difficulty swallowing. Genitourinary:  [] Chronic kidney disease   [] Difficult urination  [] Frequent urination  [] Burning with urination   [] Blood in urine Skin:  [] Rashes   [] Ulcers   [] Wounds Psychological:  [x] History of anxiety   [x]  History of major depression.  Physical Examination  Vitals:   11/18/23 1456  BP: 131/66  Pulse: 76  Resp: 18  Weight: 197 lb 6.4 oz (89.5 kg)  Height: 6' 2 (1.88 m)   Body mass index is 25.34 kg/m. Gen:  WD/WN, NAD. Appears  younger than stated age. Head: [place]/AT, No temporalis wasting. Ear/Nose/Throat: Hearing grossly intact, nares w/o erythema or drainage, trachea midline Eyes: Conjunctiva clear. Sclera non-icteric Neck: Supple. Trachea midline Pulmonary:  Good air movement, equal and clear to auscultation bilaterally.  Cardiac: RRR, No JVD Vascular:  Vessel Right Left  Radial Palpable Palpable           Musculoskeletal: M/S 5/5 throughout.  No deformity or atrophy. No edema. Neurologic: CN 2-12 intact. Sensation grossly intact in extremities.  Symmetrical.  Speech is fluent. Motor exam as listed above. Psychiatric: Judgment intact, Mood & affect appropriate for pt's clinical situation. Dermatologic: No rashes or ulcers noted.  No cellulitis or open wounds.     CBC Lab Results  Component Value Date   WBC 7.8 07/28/2017   HGB 13.8 07/28/2017   HCT 39.9 07/28/2017   MCV 91.1 07/28/2017   PLT 109 (L) 07/28/2017    BMET    Component Value Date/Time   NA 142 07/28/2017 0913   K 4.3 07/28/2017 0913   CL 107 07/28/2017 0913   CO2 25 07/28/2017 0913   GLUCOSE 118 (H) 07/28/2017 0913   BUN 22 05/23/2020 0955   CREATININE 1.12 05/23/2020 0955   CALCIUM 8.8 (L) 07/28/2017 0913   GFRNONAA >60 07/28/2017 0913   GFRAA >60 07/28/2017 0913   CrCl cannot be calculated (Patient's most recent lab result is older than the maximum 21 days allowed.).  COAG Lab Results  Component Value Date   INR  1.36 02/25/2017   INR 1.02 10/21/2011   INR 1.3 06/09/2008    Radiology No results found.   Assessment/Plan Carotid stenosis Carotid duplex today demonstrates a known right carotid artery occlusion and stable velocities just into the 40 to 59% range.  We discussed that the increased velocities may be in part due to compensatory flow with contralateral occlusion.  These are certainly not at a worrisome level and we will continue to follow these on an annual basis with duplex.  History of thoracic outlet  syndrome Status post first rib resection over 15 years ago.  Has chronic neuropathic issues but these are stable and not worse.  Selinda Gu, MD  11/18/2023 3:20 PM    This note was created with Dragon medical transcription system.  Any errors from dictation are purely unintentional

## 2023-12-22 ENCOUNTER — Ambulatory Visit: Admitting: Urology

## 2023-12-22 VITALS — BP 142/63 | HR 91

## 2023-12-22 DIAGNOSIS — R351 Nocturia: Secondary | ICD-10-CM

## 2023-12-22 DIAGNOSIS — R7989 Other specified abnormal findings of blood chemistry: Secondary | ICD-10-CM

## 2023-12-22 DIAGNOSIS — N138 Other obstructive and reflux uropathy: Secondary | ICD-10-CM

## 2023-12-22 DIAGNOSIS — C61 Malignant neoplasm of prostate: Secondary | ICD-10-CM

## 2023-12-22 LAB — URINALYSIS, ROUTINE W REFLEX MICROSCOPIC
Bilirubin, UA: NEGATIVE
Glucose, UA: NEGATIVE
Ketones, UA: NEGATIVE
Leukocytes,UA: NEGATIVE
Nitrite, UA: NEGATIVE
Protein,UA: NEGATIVE
RBC, UA: NEGATIVE
Specific Gravity, UA: 1.02 (ref 1.005–1.030)
Urobilinogen, Ur: 1 mg/dL (ref 0.2–1.0)
pH, UA: 6 (ref 5.0–7.5)

## 2023-12-22 MED ORDER — TAMSULOSIN HCL 0.4 MG PO CAPS: 0.8000 mg | ORAL_CAPSULE | Freq: Every day | ORAL | 11 refills | Status: AC

## 2023-12-22 MED ORDER — TADALAFIL 5 MG PO TABS: 5.0000 mg | ORAL_TABLET | ORAL | 11 refills | Status: AC | PRN

## 2023-12-22 MED ORDER — SILDENAFIL CITRATE 100 MG PO TABS: 100.0000 mg | ORAL_TABLET | ORAL | 11 refills | Status: AC | PRN

## 2023-12-22 NOTE — Progress Notes (Signed)
 12/22/2023 1:45 PM   Kenneth [name] [birth year] [id number]  Referring provider: Shona Norleen PEDLAR, MD [address] [name],  [name] [address]  Followup prostate cancer   HPI: Mr Kenneth [name] is a 68yo here for followup for prostate cancer, BPH with nocturia, and hypogonadism. IPSS 15 QOL 4 on flomax  0.8mg  daily and tadalafil  5mg  daily. Nocturia 1x. Urine stream intermittently weak.  He has a hx of low risk prostate cancer with his last prostate biopsy was 2023 and MRi prostate in 2022.    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   Carotid artery occlusion    right   Chronic pain disorder    left arm, shoulder and chest ;thoracic syndrome   Collapsed lung 2010   Depression    Fibromyalgia    left shoulder area (10/24/2011)   GERD (gastroesophageal reflux disease)    History of blood transfusion 2010   History of thoracic outlet syndrome 2008   rib removed in 2009   Insomnia due to medical condition    Neuromuscular disorder (HCC)    Neuropathy of hand    left   Pneumonia 2010   PONV (postoperative nausea and vomiting)    Prostate cancer (HCC)    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   RSD (reflex sympathetic dystrophy)    left shoulder and left arm (10/24/2011)   Sleep apnea    could not tolerate CPAP, PCP aware   Snoring     Surgical History: Past Surgical History:  Procedure Laterality Date   ANTERIOR CERVICAL DECOMP/DISCECTOMY FUSION  10/24/2011   ANTERIOR CERVICAL DECOMP/DISCECTOMY FUSION  10/24/2011   Procedure: ANTERIOR CERVICAL DECOMPRESSION/DISCECTOMY FUSION 2 LEVELS;  Surgeon: Kenneth Sprang, MD;  Location: [facility] OR;  Service: Orthopedics;  Laterality: N/A;  ACDF C3-5 C-ARM SKYTRON TABLE SYNTHES VECTOR TITAN CAGE NEURO MONITORING SYTEM   BIOPSY N/A 02/25/2017   Procedure: BIOPSY DIAPHRAGMATIC PLAQUE;  Surgeon: Kenneth Dallas NOVAK, MD;  Location: [facility] OR;  Service: Thoracic;  Laterality: N/A;    COLONOSCOPY WITH PROPOFOL  N/A 08/01/2017   Procedure: COLONOSCOPY WITH PROPOFOL ;  Surgeon: Kenneth Claudis PENNER, MD;  [facility];  Service: Endoscopy;  Laterality: N/A;  11:15   CYSTECTOMY     left hand x 3   DECORTICATION Left 02/25/2017   Procedure: DECORTICATION;  Surgeon: Kenneth Dallas NOVAK, MD;  Location: [facility] OR;  Service: Thoracic;  Laterality: Left;   EMPYEMA DRAINAGE Left 02/25/2017   Procedure: LEFT EMPYEMA DRAINAGE;  Surgeon: Kenneth Dallas NOVAK, MD;  Location: [facility] OR;  Service: Thoracic;  Laterality: Left;   EYE SURGERY     lasix   KNEE ARTHROSCOPY  1987   right   POLYPECTOMY  08/01/2017   Procedure: POLYPECTOMY;  Surgeon: Kenneth Claudis PENNER, MD;  [facility];  Service: Endoscopy;;  colon   REFRACTIVE SURGERY  2003   bilaterally   RIB RESECTION  2009   left anterior (10/24/2011)   VASCULAR SURGERY     to remove  blood clots   VIDEO ASSISTED THORACOSCOPY (VATS)/EMPYEMA Left 02/25/2017   Procedure: LEFT VIDEO ASSISTED THORACOSCOPY (VATS);  Surgeon: Kenneth Dallas NOVAK, MD;  Location: [facility] OR;  Service: Thoracic;  Laterality: Left;   VIDEO BRONCHOSCOPY N/A 02/25/2017   Procedure: VIDEO BRONCHOSCOPY;  Surgeon: Kenneth Dallas NOVAK, MD;  Location: [facility] OR;  Service: Thoracic;  Laterality: N/A;    Home Medications:  Allergies as of 12/22/2023   No Known Allergies      Medication List        Accurate as of December 22, 2023  1:45 PM. If you have any questions, ask your nurse or doctor.          ALPRAZolam  0.5 MG tablet Commonly known as: XANAX  Take 0.5 mg by mouth 2 (two) times daily.   aspirin EC 81 MG tablet Take 1 tablet (81 mg total) by mouth daily.   celecoxib 200 MG capsule Commonly known as: CELEBREX   cetirizine 10 MG tablet Commonly known as: ZYRTEC Take 10 mg by mouth daily.   Cinnamon 500 MG capsule Take 500 mg by mouth daily.   Coenzyme Q10 200 MG capsule Take 200 mg by mouth daily.   cyclobenzaprine  5 MG tablet Commonly known as:  FLEXERIL  cyclobenzaprine  5 mg tablet  TAKE 1 TABLET BY MOUTH THREE TIMES A DAY AS NEEDED   fentaNYL  12 MCG/HR Commonly known as: DURAGESIC  Place 12 mcg onto the skin every other day.   Krill Oil 500 MG Caps Take 500 mg by mouth daily.   levofloxacin  750 MG tablet Commonly known as: Levaquin  Take 1 tablet (750 mg total) by mouth daily. Take 1 hour prior to your procedure on 02/20/2021   mirtazapine  15 MG tablet Commonly known as: REMERON  Take 15 mg by mouth at bedtime.   mometasone 50 MCG/ACT nasal spray Commonly known as: NASONEX Place 2 sprays into the nose daily as needed.   naproxen sodium 220 MG tablet Commonly known as: ALEVE Take 220 mg by mouth daily as needed.   pregabalin  75 MG capsule Commonly known as: LYRICA  Take 75-150 mg by mouth See admin instructions. Take 150 mg by mouth in the morning and take 75 mg by mouth at supper   PROBIOTIC DAILY PO Take 1 capsule by mouth daily.   RABEprazole 20 MG tablet Commonly known as: ACIPHEX Take 20 mg by mouth daily. PRN   rosuvastatin 5 MG tablet Commonly known as: CRESTOR Take 5 mg by mouth daily.   saw palmetto 160 MG capsule Take 160 mg by mouth daily.   SUPER B COMPLEX/C PO Take 1 tablet by mouth daily.   tadalafil  5 MG tablet Commonly known as: CIALIS  TAKE ONE TABLET BY MOUTH AS NEEDED   tamsulosin  0.4 MG Caps capsule Commonly known as: FLOMAX  Take 0.8 mg by mouth daily.   traMADol  50 MG tablet Commonly known as: ULTRAM  take 1 twice a day as needed take 1 twice a day as needed   traZODone  50 MG tablet Commonly known as: DESYREL  Take 50 mg by mouth at bedtime.   Turmeric 500 MG Caps Take 1 capsule by mouth daily.   Valium  5 MG tablet Generic drug: diazepam  Take 2 tablets (10 mg total) by mouth every 6 (six) hours as needed for anxiety (take 2 tablets 2 hrs before MRI).        Allergies: No Known Allergies  Family History: Family History  Problem Relation Age of Onset   Diabetes  Mother     Social History:  reports that he quit smoking about 19 years ago. His smoking use included cigarettes. He started smoking about 49 years ago. He has a 30 pack-year smoking history. His smokeless tobacco use includes chew. He reports that he does not drink alcohol and does not use drugs.  ROS: All other review of systems were reviewed and are negative except what is noted above in HPI  Physical Exam: BP (!) 142/63   Pulse 91   Constitutional:  Alert and oriented, No acute distress. HEENT: [place] AT, moist mucus membranes.  Trachea midline, no masses. Cardiovascular: No clubbing, cyanosis, or edema. Respiratory: Normal respiratory effort, no increased work of breathing. GI: Abdomen is soft, nontender, nondistended, no abdominal masses GU: No CVA tenderness.  Lymph: No cervical or inguinal lymphadenopathy. Skin: No rashes, bruises or suspicious lesions. Neurologic: Grossly intact, no focal deficits, moving all 4 extremities. Psychiatric: Normal mood and affect.  Laboratory Data: Lab Results  Component Value Date   WBC 7.8 07/28/2017   HGB 13.8 07/28/2017   HCT 39.9 07/28/2017   MCV 91.1 07/28/2017   PLT 109 (L) 07/28/2017    Lab Results  Component Value Date   CREATININE 1.12 05/23/2020    Lab Results  Component Value Date   PSA 6.2 (H) 06/23/2019    Lab Results  Component Value Date   TESTOSTERONE  394 05/09/2020    No results found for: HGBA1C  Urinalysis    Component Value Date/Time   COLORURINE YELLOW 06/10/2008 0151   APPEARANCEUR Clear 08/27/2022 1331   LABSPEC 1.014 06/10/2008 0151   PHURINE 6.0 06/10/2008 0151   GLUCOSEU Negative 08/27/2022 1331   HGBUR NEGATIVE 06/10/2008 0151   BILIRUBINUR Negative 08/27/2022 1331   KETONESUR NEGATIVE 06/10/2008 0151   PROTEINUR Negative 08/27/2022 1331   PROTEINUR NEGATIVE 06/10/2008 0151   UROBILINOGEN 0.2 06/29/2019 1017   UROBILINOGEN 0.2 06/10/2008 0151   NITRITE Negative 08/27/2022 1331   NITRITE  NEGATIVE 06/10/2008 0151   LEUKOCYTESUR Negative 08/27/2022 1331    Lab Results  Component Value Date   LABMICR Comment 08/27/2022   BACTERIA RARE 06/10/2008    Pertinent Imaging:  No results found for this or any previous visit.  No results found for this or any previous visit.  No results found for this or any previous visit.  No results found for this or any previous visit.  No results found for this or any previous visit.  No results found for this or any previous visit.  No results found for this or any previous visit.  No results found for this or any previous visit.   Assessment & Plan:    1. BPH with urinary obstruction (Primary) Flomax  0.8mg  daily - Urinalysis, Routine w reflex microscopic  2. Malignant neoplasm of prostate [facility]) IsoPSa, will call with results Prostate MRI, will call with results.  If IsoPSa and MRI are negative we will see him back in 6 months with a PSA  3. Erectile dysfunction -tadalafil  5mg  daily and sildenafil  50-100mg  PRN  4. Nocturia Flomax  0.8mg  daily   No follow-ups on file.  Belvie Clara, MD  [facility]

## 2023-12-29 DIAGNOSIS — C61 Malignant neoplasm of prostate: Secondary | ICD-10-CM

## 2023-12-30 ENCOUNTER — Encounter: Payer: Self-pay | Admitting: Urology

## 2023-12-30 NOTE — Patient Instructions (Signed)

## 2023-12-31 ENCOUNTER — Telehealth: Payer: Self-pay

## 2024-01-01 ENCOUNTER — Ambulatory Visit (HOSPITAL_COMMUNITY)

## 2024-01-01 ENCOUNTER — Other Ambulatory Visit (HOSPITAL_COMMUNITY): Payer: Self-pay | Admitting: Radiology

## 2024-01-01 DIAGNOSIS — C61 Malignant neoplasm of prostate: Secondary | ICD-10-CM | POA: Insufficient documentation

## 2024-01-01 MED ORDER — GADOBUTROL 1 MMOL/ML IV SOLN
8.0000 mL | Freq: Once | INTRAVENOUS | Status: AC | PRN
  Administered 2024-01-01: 13:00:00 8 mL via INTRAVENOUS

## 2024-01-27 NOTE — Telephone Encounter (Signed)
 Patient called and made aware of IsoPsa results and providers recommendations.

## 2024-01-28 NOTE — Telephone Encounter (Signed)
 Wife made aware of negative prostate biopsy

## 2024-06-25 ENCOUNTER — Other Ambulatory Visit

## 2024-06-30 ENCOUNTER — Ambulatory Visit: Admitting: Urology

## 2024-11-16 ENCOUNTER — Encounter (INDEPENDENT_AMBULATORY_CARE_PROVIDER_SITE_OTHER)

## 2024-11-16 ENCOUNTER — Ambulatory Visit (INDEPENDENT_AMBULATORY_CARE_PROVIDER_SITE_OTHER): Admitting: Vascular Surgery
